# Patient Record
Sex: Male | Born: 1950
Health system: Southern US, Community
[De-identification: ages and names within clinical notes are randomized; demographics above are authoritative.]

## PROBLEM LIST (undated history)

## (undated) DIAGNOSIS — G4730 Sleep apnea, unspecified: Secondary | ICD-10-CM

## (undated) DIAGNOSIS — I10 Essential (primary) hypertension: Secondary | ICD-10-CM

## (undated) DIAGNOSIS — M1990 Unspecified osteoarthritis, unspecified site: Secondary | ICD-10-CM

## (undated) DIAGNOSIS — F419 Anxiety disorder, unspecified: Secondary | ICD-10-CM

## (undated) DIAGNOSIS — K219 Gastro-esophageal reflux disease without esophagitis: Secondary | ICD-10-CM

## (undated) DIAGNOSIS — Z87442 Personal history of urinary calculi: Secondary | ICD-10-CM

## (undated) DIAGNOSIS — E785 Hyperlipidemia, unspecified: Secondary | ICD-10-CM

## (undated) DIAGNOSIS — E119 Type 2 diabetes mellitus without complications: Secondary | ICD-10-CM

## (undated) DIAGNOSIS — N189 Chronic kidney disease, unspecified: Secondary | ICD-10-CM

## (undated) DIAGNOSIS — F32A Depression, unspecified: Secondary | ICD-10-CM

## (undated) DIAGNOSIS — F329 Major depressive disorder, single episode, unspecified: Secondary | ICD-10-CM

## (undated) DIAGNOSIS — T7840XA Allergy, unspecified, initial encounter: Secondary | ICD-10-CM

## (undated) HISTORY — PX: COLONOSCOPY: SHX174

## (undated) HISTORY — DX: Allergy, unspecified, initial encounter: T78.40XA

## (undated) HISTORY — DX: Essential (primary) hypertension: I10

## (undated) HISTORY — PX: LITHOTRIPSY: SUR834

## (undated) HISTORY — DX: Anxiety disorder, unspecified: F41.9

## (undated) HISTORY — DX: Gastro-esophageal reflux disease without esophagitis: K21.9

---

## 2003-05-27 DIAGNOSIS — G4730 Sleep apnea, unspecified: Secondary | ICD-10-CM

## 2003-05-27 HISTORY — DX: Sleep apnea, unspecified: G47.30

## 2003-08-01 ENCOUNTER — Ambulatory Visit (HOSPITAL_BASED_OUTPATIENT_CLINIC_OR_DEPARTMENT_OTHER): Admission: RE | Admit: 2003-08-01 | Discharge: 2003-08-01 | Payer: Self-pay | Admitting: Orthopedic Surgery

## 2003-08-01 ENCOUNTER — Ambulatory Visit (HOSPITAL_COMMUNITY): Admission: RE | Admit: 2003-08-01 | Discharge: 2003-08-01 | Payer: Self-pay | Admitting: Orthopedic Surgery

## 2003-09-20 ENCOUNTER — Ambulatory Visit (HOSPITAL_COMMUNITY): Admission: RE | Admit: 2003-09-20 | Discharge: 2003-09-20 | Payer: Self-pay | Admitting: Family Medicine

## 2003-09-27 ENCOUNTER — Ambulatory Visit (HOSPITAL_BASED_OUTPATIENT_CLINIC_OR_DEPARTMENT_OTHER): Admission: RE | Admit: 2003-09-27 | Discharge: 2003-09-27 | Payer: Self-pay | Admitting: Urology

## 2003-09-27 ENCOUNTER — Ambulatory Visit (HOSPITAL_COMMUNITY): Admission: RE | Admit: 2003-09-27 | Discharge: 2003-09-27 | Payer: Self-pay | Admitting: Urology

## 2004-03-22 ENCOUNTER — Ambulatory Visit (HOSPITAL_BASED_OUTPATIENT_CLINIC_OR_DEPARTMENT_OTHER): Admission: RE | Admit: 2004-03-22 | Discharge: 2004-03-22 | Payer: Self-pay | Admitting: Family Medicine

## 2004-05-24 ENCOUNTER — Encounter (INDEPENDENT_AMBULATORY_CARE_PROVIDER_SITE_OTHER): Payer: Self-pay | Admitting: Specialist

## 2004-05-24 ENCOUNTER — Ambulatory Visit (HOSPITAL_COMMUNITY): Admission: RE | Admit: 2004-05-24 | Discharge: 2004-05-24 | Payer: Self-pay | Admitting: Gastroenterology

## 2004-05-26 HISTORY — PX: OTHER SURGICAL HISTORY: SHX169

## 2006-01-27 ENCOUNTER — Ambulatory Visit: Payer: Self-pay | Admitting: Family Medicine

## 2006-05-01 ENCOUNTER — Ambulatory Visit: Payer: Self-pay | Admitting: Family Medicine

## 2006-05-01 LAB — CONVERTED CEMR LAB
Calcium: 9 mg/dL (ref 8.4–10.5)
Chloride: 108 meq/L (ref 96–112)
Glucose, Bld: 90 mg/dL (ref 70–99)
Potassium: 4.4 meq/L (ref 3.5–5.1)
Triglyceride fasting, serum: 89 mg/dL (ref 0–149)
VLDL: 18 mg/dL (ref 0–40)

## 2006-05-28 ENCOUNTER — Ambulatory Visit: Payer: Self-pay | Admitting: Internal Medicine

## 2006-06-08 ENCOUNTER — Ambulatory Visit: Payer: Self-pay | Admitting: Family Medicine

## 2006-06-11 ENCOUNTER — Encounter (INDEPENDENT_AMBULATORY_CARE_PROVIDER_SITE_OTHER): Payer: Self-pay | Admitting: Specialist

## 2006-06-11 ENCOUNTER — Ambulatory Visit: Payer: Self-pay | Admitting: Internal Medicine

## 2006-06-11 ENCOUNTER — Encounter: Payer: Self-pay | Admitting: Family Medicine

## 2006-06-11 DIAGNOSIS — K5730 Diverticulosis of large intestine without perforation or abscess without bleeding: Secondary | ICD-10-CM | POA: Insufficient documentation

## 2006-06-11 LAB — HM COLONOSCOPY

## 2006-07-19 DIAGNOSIS — G4730 Sleep apnea, unspecified: Secondary | ICD-10-CM

## 2006-07-19 DIAGNOSIS — D126 Benign neoplasm of colon, unspecified: Secondary | ICD-10-CM

## 2006-07-19 DIAGNOSIS — I10 Essential (primary) hypertension: Secondary | ICD-10-CM

## 2006-08-07 ENCOUNTER — Ambulatory Visit: Payer: Self-pay | Admitting: Family Medicine

## 2006-11-09 ENCOUNTER — Ambulatory Visit: Payer: Self-pay | Admitting: Family Medicine

## 2006-11-09 DIAGNOSIS — R0989 Other specified symptoms and signs involving the circulatory and respiratory systems: Secondary | ICD-10-CM

## 2006-11-09 DIAGNOSIS — R0609 Other forms of dyspnea: Secondary | ICD-10-CM | POA: Insufficient documentation

## 2006-11-10 ENCOUNTER — Telehealth (INDEPENDENT_AMBULATORY_CARE_PROVIDER_SITE_OTHER): Payer: Self-pay | Admitting: *Deleted

## 2006-11-13 ENCOUNTER — Ambulatory Visit: Payer: Self-pay | Admitting: Family Medicine

## 2006-11-25 ENCOUNTER — Encounter (INDEPENDENT_AMBULATORY_CARE_PROVIDER_SITE_OTHER): Payer: Self-pay | Admitting: Family Medicine

## 2006-11-25 ENCOUNTER — Telehealth (INDEPENDENT_AMBULATORY_CARE_PROVIDER_SITE_OTHER): Payer: Self-pay | Admitting: *Deleted

## 2006-11-26 ENCOUNTER — Encounter (INDEPENDENT_AMBULATORY_CARE_PROVIDER_SITE_OTHER): Payer: Self-pay | Admitting: Family Medicine

## 2006-12-01 ENCOUNTER — Telehealth (INDEPENDENT_AMBULATORY_CARE_PROVIDER_SITE_OTHER): Payer: Self-pay | Admitting: *Deleted

## 2006-12-02 ENCOUNTER — Encounter (INDEPENDENT_AMBULATORY_CARE_PROVIDER_SITE_OTHER): Payer: Self-pay | Admitting: Family Medicine

## 2006-12-03 ENCOUNTER — Encounter (INDEPENDENT_AMBULATORY_CARE_PROVIDER_SITE_OTHER): Payer: Self-pay | Admitting: *Deleted

## 2006-12-21 ENCOUNTER — Ambulatory Visit: Payer: Self-pay | Admitting: Internal Medicine

## 2007-02-02 ENCOUNTER — Ambulatory Visit: Payer: Self-pay | Admitting: Internal Medicine

## 2007-03-04 ENCOUNTER — Ambulatory Visit: Payer: Self-pay | Admitting: Family Medicine

## 2007-03-04 DIAGNOSIS — R51 Headache: Secondary | ICD-10-CM

## 2007-03-04 DIAGNOSIS — R519 Headache, unspecified: Secondary | ICD-10-CM | POA: Insufficient documentation

## 2007-03-07 LAB — CONVERTED CEMR LAB
CO2: 29 meq/L (ref 19–32)
Cholesterol: 142 mg/dL (ref 0–200)
GFR calc non Af Amer: 82 mL/min
HDL: 24 mg/dL — ABNORMAL LOW (ref >39.0)
LDL Cholesterol: 89 mg/dL (ref 0–99)
Potassium: 4.2 meq/L (ref 3.5–5.1)
Total CHOL/HDL Ratio: 5.9
Triglycerides: 146 mg/dL (ref 0–149)
VLDL: 29 mg/dL (ref 0–40)

## 2007-03-08 ENCOUNTER — Encounter (INDEPENDENT_AMBULATORY_CARE_PROVIDER_SITE_OTHER): Payer: Self-pay | Admitting: *Deleted

## 2007-03-08 ENCOUNTER — Telehealth (INDEPENDENT_AMBULATORY_CARE_PROVIDER_SITE_OTHER): Payer: Self-pay | Admitting: *Deleted

## 2007-07-12 ENCOUNTER — Telehealth (INDEPENDENT_AMBULATORY_CARE_PROVIDER_SITE_OTHER): Payer: Self-pay | Admitting: *Deleted

## 2007-07-15 ENCOUNTER — Ambulatory Visit: Payer: Self-pay | Admitting: Family Medicine

## 2007-07-15 DIAGNOSIS — M549 Dorsalgia, unspecified: Secondary | ICD-10-CM | POA: Insufficient documentation

## 2007-07-28 ENCOUNTER — Telehealth (INDEPENDENT_AMBULATORY_CARE_PROVIDER_SITE_OTHER): Payer: Self-pay | Admitting: *Deleted

## 2008-02-25 ENCOUNTER — Telehealth (INDEPENDENT_AMBULATORY_CARE_PROVIDER_SITE_OTHER): Payer: Self-pay | Admitting: *Deleted

## 2008-03-02 ENCOUNTER — Ambulatory Visit: Payer: Self-pay | Admitting: Family Medicine

## 2008-03-02 DIAGNOSIS — M6530 Trigger finger, unspecified finger: Secondary | ICD-10-CM | POA: Insufficient documentation

## 2008-03-02 DIAGNOSIS — IMO0002 Reserved for concepts with insufficient information to code with codable children: Secondary | ICD-10-CM

## 2008-03-28 ENCOUNTER — Telehealth (INDEPENDENT_AMBULATORY_CARE_PROVIDER_SITE_OTHER): Payer: Self-pay | Admitting: *Deleted

## 2008-05-01 ENCOUNTER — Telehealth (INDEPENDENT_AMBULATORY_CARE_PROVIDER_SITE_OTHER): Payer: Self-pay | Admitting: *Deleted

## 2008-05-22 ENCOUNTER — Encounter (INDEPENDENT_AMBULATORY_CARE_PROVIDER_SITE_OTHER): Payer: Self-pay | Admitting: *Deleted

## 2008-05-22 ENCOUNTER — Ambulatory Visit: Payer: Self-pay | Admitting: Family Medicine

## 2008-05-22 DIAGNOSIS — D485 Neoplasm of uncertain behavior of skin: Secondary | ICD-10-CM

## 2008-05-25 ENCOUNTER — Ambulatory Visit: Payer: Self-pay | Admitting: Family Medicine

## 2008-05-30 ENCOUNTER — Telehealth (INDEPENDENT_AMBULATORY_CARE_PROVIDER_SITE_OTHER): Payer: Self-pay | Admitting: *Deleted

## 2008-05-30 LAB — CONVERTED CEMR LAB
AST: 18 units/L (ref 0–37)
Alkaline Phosphatase: 86 units/L (ref 39–117)
BUN: 11 mg/dL (ref 6–23)
Calcium: 9.2 mg/dL (ref 8.4–10.5)
Chloride: 107 meq/L (ref 96–112)
Cholesterol: 140 mg/dL (ref 0–200)
Eosinophils Relative: 1.2 % (ref 0.0–5.0)
GFR calc Af Amer: 99 mL/min
GFR calc non Af Amer: 82 mL/min
Glucose, Bld: 110 mg/dL — ABNORMAL HIGH (ref 70–99)
LDL Cholesterol: 94 mg/dL (ref 0–99)
Lymphocytes Relative: 17.5 % (ref 12.0–46.0)
Monocytes Absolute: 0.5 10*3/uL (ref 0.1–1.0)
Neutro Abs: 3.9 10*3/uL (ref 1.4–7.7)
Neutrophils Relative %: 72.7 % (ref 43.0–77.0)
PSA: 0.35 ng/mL (ref 0.10–4.00)
Sodium: 142 meq/L (ref 135–145)
Total Bilirubin: 0.7 mg/dL (ref 0.3–1.2)
Total CHOL/HDL Ratio: 6.3
Triglycerides: 118 mg/dL (ref 0–149)
WBC: 5.5 10*3/uL (ref 4.5–10.5)

## 2008-06-02 ENCOUNTER — Telehealth (INDEPENDENT_AMBULATORY_CARE_PROVIDER_SITE_OTHER): Payer: Self-pay | Admitting: *Deleted

## 2008-06-02 ENCOUNTER — Ambulatory Visit: Payer: Self-pay | Admitting: Family Medicine

## 2008-06-02 DIAGNOSIS — F411 Generalized anxiety disorder: Secondary | ICD-10-CM | POA: Insufficient documentation

## 2008-06-13 ENCOUNTER — Encounter: Payer: Self-pay | Admitting: Family Medicine

## 2008-07-05 ENCOUNTER — Ambulatory Visit: Payer: Self-pay | Admitting: Family Medicine

## 2008-07-05 DIAGNOSIS — J309 Allergic rhinitis, unspecified: Secondary | ICD-10-CM | POA: Insufficient documentation

## 2008-07-05 DIAGNOSIS — J019 Acute sinusitis, unspecified: Secondary | ICD-10-CM

## 2008-07-25 ENCOUNTER — Telehealth (INDEPENDENT_AMBULATORY_CARE_PROVIDER_SITE_OTHER): Payer: Self-pay | Admitting: *Deleted

## 2008-09-25 ENCOUNTER — Telehealth (INDEPENDENT_AMBULATORY_CARE_PROVIDER_SITE_OTHER): Payer: Self-pay | Admitting: *Deleted

## 2008-09-27 ENCOUNTER — Telehealth (INDEPENDENT_AMBULATORY_CARE_PROVIDER_SITE_OTHER): Payer: Self-pay | Admitting: *Deleted

## 2008-10-30 ENCOUNTER — Telehealth (INDEPENDENT_AMBULATORY_CARE_PROVIDER_SITE_OTHER): Payer: Self-pay | Admitting: *Deleted

## 2008-11-24 ENCOUNTER — Ambulatory Visit: Payer: Self-pay | Admitting: Family Medicine

## 2008-11-24 DIAGNOSIS — E785 Hyperlipidemia, unspecified: Secondary | ICD-10-CM

## 2008-11-30 ENCOUNTER — Ambulatory Visit: Payer: Self-pay | Admitting: Family Medicine

## 2008-12-01 ENCOUNTER — Telehealth (INDEPENDENT_AMBULATORY_CARE_PROVIDER_SITE_OTHER): Payer: Self-pay | Admitting: *Deleted

## 2008-12-01 LAB — CONVERTED CEMR LAB
Alkaline Phosphatase: 85 units/L (ref 39–117)
HDL: 26.6 mg/dL — ABNORMAL LOW (ref >39.00)
Total Bilirubin: 1 mg/dL (ref 0.3–1.2)
Triglycerides: 146 mg/dL (ref 0.0–149.0)
VLDL: 29.2 mg/dL (ref 0.0–40.0)

## 2009-01-01 ENCOUNTER — Telehealth (INDEPENDENT_AMBULATORY_CARE_PROVIDER_SITE_OTHER): Payer: Self-pay | Admitting: *Deleted

## 2009-01-05 ENCOUNTER — Ambulatory Visit: Payer: Self-pay | Admitting: Family Medicine

## 2009-01-08 ENCOUNTER — Telehealth (INDEPENDENT_AMBULATORY_CARE_PROVIDER_SITE_OTHER): Payer: Self-pay | Admitting: *Deleted

## 2009-01-09 ENCOUNTER — Encounter (INDEPENDENT_AMBULATORY_CARE_PROVIDER_SITE_OTHER): Payer: Self-pay | Admitting: *Deleted

## 2009-01-09 LAB — CONVERTED CEMR LAB
ALT: 20 units/L (ref 0–53)
Albumin: 3.8 g/dL (ref 3.5–5.2)
Alkaline Phosphatase: 81 units/L (ref 39–117)
Total Protein: 6.8 g/dL (ref 6.0–8.3)

## 2009-01-18 ENCOUNTER — Telehealth (INDEPENDENT_AMBULATORY_CARE_PROVIDER_SITE_OTHER): Payer: Self-pay | Admitting: *Deleted

## 2009-02-12 ENCOUNTER — Telehealth (INDEPENDENT_AMBULATORY_CARE_PROVIDER_SITE_OTHER): Payer: Self-pay | Admitting: *Deleted

## 2009-03-09 ENCOUNTER — Telehealth (INDEPENDENT_AMBULATORY_CARE_PROVIDER_SITE_OTHER): Payer: Self-pay | Admitting: *Deleted

## 2009-03-30 ENCOUNTER — Ambulatory Visit: Payer: Self-pay | Admitting: Family Medicine

## 2009-03-30 DIAGNOSIS — S139XXA Sprain of joints and ligaments of unspecified parts of neck, initial encounter: Secondary | ICD-10-CM

## 2009-04-10 ENCOUNTER — Telehealth (INDEPENDENT_AMBULATORY_CARE_PROVIDER_SITE_OTHER): Payer: Self-pay | Admitting: *Deleted

## 2009-05-09 ENCOUNTER — Telehealth (INDEPENDENT_AMBULATORY_CARE_PROVIDER_SITE_OTHER): Payer: Self-pay | Admitting: *Deleted

## 2009-06-11 ENCOUNTER — Telehealth (INDEPENDENT_AMBULATORY_CARE_PROVIDER_SITE_OTHER): Payer: Self-pay | Admitting: *Deleted

## 2009-06-11 ENCOUNTER — Encounter: Payer: Self-pay | Admitting: Internal Medicine

## 2009-06-12 ENCOUNTER — Telehealth (INDEPENDENT_AMBULATORY_CARE_PROVIDER_SITE_OTHER): Payer: Self-pay | Admitting: *Deleted

## 2009-07-02 ENCOUNTER — Ambulatory Visit: Payer: Self-pay | Admitting: Family

## 2009-07-02 LAB — CONVERTED CEMR LAB
AST: 23 units/L (ref 0–37)
Albumin: 3.8 g/dL (ref 3.5–5.2)
Alkaline Phosphatase: 80 units/L (ref 39–117)
BUN: 10 mg/dL (ref 6–23)
Basophils Absolute: 0 10*3/uL (ref 0.0–0.1)
Basophils Relative: 0.4 % (ref 0.0–3.0)
CO2: 30 meq/L (ref 19–32)
Calcium: 8.8 mg/dL (ref 8.4–10.5)
Chloride: 110 meq/L (ref 96–112)
Cholesterol: 107 mg/dL (ref 0–200)
Creatinine, Ser: 1.1 mg/dL (ref 0.4–1.5)
Eosinophils Absolute: 0.1 10*3/uL (ref 0.0–0.7)
Eosinophils Relative: 2.8 % (ref 0.0–5.0)
GFR calc non Af Amer: 72.56 mL/min (ref >60)
Glucose, Bld: 92 mg/dL (ref 70–99)
HCT: 44.2 % (ref 39.0–52.0)
Hemoglobin: 14.6 g/dL (ref 13.0–17.0)
LDL Cholesterol: 49 mg/dL (ref 0–99)
Lymphs Abs: 1.4 10*3/uL (ref 0.7–4.0)
MCV: 94.4 fL (ref 78.0–100.0)
Neutro Abs: 2.5 10*3/uL (ref 1.4–7.7)
PSA: 0.32 ng/mL (ref 0.10–4.00)
RBC: 4.69 M/uL (ref 4.22–5.81)
Total CHOL/HDL Ratio: 4
Triglycerides: 138 mg/dL (ref 0.0–149.0)
WBC: 4.4 10*3/uL — ABNORMAL LOW (ref 4.5–10.5)

## 2009-07-03 ENCOUNTER — Encounter: Payer: Self-pay | Admitting: Family

## 2009-08-21 ENCOUNTER — Telehealth (INDEPENDENT_AMBULATORY_CARE_PROVIDER_SITE_OTHER): Payer: Self-pay | Admitting: *Deleted

## 2009-09-11 ENCOUNTER — Telehealth (INDEPENDENT_AMBULATORY_CARE_PROVIDER_SITE_OTHER): Payer: Self-pay | Admitting: *Deleted

## 2009-09-25 ENCOUNTER — Ambulatory Visit: Payer: Self-pay | Admitting: Family Medicine

## 2009-09-25 DIAGNOSIS — IMO0002 Reserved for concepts with insufficient information to code with codable children: Secondary | ICD-10-CM | POA: Insufficient documentation

## 2009-10-11 ENCOUNTER — Ambulatory Visit: Payer: Self-pay | Admitting: Family Medicine

## 2009-10-30 ENCOUNTER — Telehealth: Payer: Self-pay | Admitting: Family Medicine

## 2009-11-28 ENCOUNTER — Telehealth (INDEPENDENT_AMBULATORY_CARE_PROVIDER_SITE_OTHER): Payer: Self-pay | Admitting: *Deleted

## 2010-01-10 ENCOUNTER — Telehealth (INDEPENDENT_AMBULATORY_CARE_PROVIDER_SITE_OTHER): Payer: Self-pay | Admitting: *Deleted

## 2010-01-14 ENCOUNTER — Telehealth (INDEPENDENT_AMBULATORY_CARE_PROVIDER_SITE_OTHER): Payer: Self-pay | Admitting: *Deleted

## 2010-01-29 ENCOUNTER — Telehealth: Payer: Self-pay | Admitting: Family Medicine

## 2010-02-06 ENCOUNTER — Telehealth (INDEPENDENT_AMBULATORY_CARE_PROVIDER_SITE_OTHER): Payer: Self-pay | Admitting: *Deleted

## 2010-02-11 ENCOUNTER — Telehealth (INDEPENDENT_AMBULATORY_CARE_PROVIDER_SITE_OTHER): Payer: Self-pay | Admitting: *Deleted

## 2010-02-25 ENCOUNTER — Telehealth: Payer: Self-pay | Admitting: Family Medicine

## 2010-04-15 ENCOUNTER — Telehealth (INDEPENDENT_AMBULATORY_CARE_PROVIDER_SITE_OTHER): Payer: Self-pay | Admitting: *Deleted

## 2010-05-01 ENCOUNTER — Telehealth: Payer: Self-pay | Admitting: Family Medicine

## 2010-05-13 ENCOUNTER — Telehealth (INDEPENDENT_AMBULATORY_CARE_PROVIDER_SITE_OTHER): Payer: Self-pay | Admitting: *Deleted

## 2010-05-28 ENCOUNTER — Telehealth: Payer: Self-pay | Admitting: Family Medicine

## 2010-06-10 ENCOUNTER — Ambulatory Visit
Admission: RE | Admit: 2010-06-10 | Discharge: 2010-06-10 | Payer: Self-pay | Source: Home / Self Care | Attending: Family Medicine | Admitting: Family Medicine

## 2010-06-11 ENCOUNTER — Telehealth (INDEPENDENT_AMBULATORY_CARE_PROVIDER_SITE_OTHER): Payer: Self-pay | Admitting: *Deleted

## 2010-06-18 ENCOUNTER — Telehealth (INDEPENDENT_AMBULATORY_CARE_PROVIDER_SITE_OTHER): Payer: Self-pay | Admitting: *Deleted

## 2010-06-23 LAB — CONVERTED CEMR LAB
Calcium: 9 mg/dL (ref 8.4–10.5)
Cholesterol: 146 mg/dL (ref 0–200)
Creatinine, Ser: 0.9 mg/dL (ref 0.4–1.5)
Glucose, Bld: 99 mg/dL (ref 70–99)
HDL: 22.9 mg/dL — ABNORMAL LOW (ref >39.0)
LDL Cholesterol: 100 mg/dL — ABNORMAL HIGH (ref 0–99)
Total CHOL/HDL Ratio: 6.4
Triglycerides: 115 mg/dL (ref 0–149)
VLDL: 23 mg/dL (ref 0–40)

## 2010-06-25 NOTE — Progress Notes (Signed)
Summary: Refill Request  Phone Note Refill Request Call back at [phone] Message from:  Patient's Wife on October 30, 2009 8:24 AM  Refills Requested: Medication #1:  ALPRAZOLAM 0.5 MG  TABS 1 tab as needed for anxiety   Dosage confirmed as above?Dosage Confirmed   Supply Requested: 1 month   Last Refilled: 08/21/2009   Notes: x1 [facility]  Next Appointment Scheduled: none Initial call taken by: Harold Barban,  October 30, 2009 8:24 AM  Follow-up for Phone Call        last ov- 10/11/09. Army Fossa CMA  October 30, 2009 8:31 AM   Additional Follow-up for Phone Call Additional follow up Details #1::        ok to refill x1 Additional Follow-up by: Loreen Freud DO,  October 30, 2009 9:26 AM    Prescriptions: ALPRAZOLAM 0.5 MG  TABS (ALPRAZOLAM) 1 tab as needed for anxiety  #30 x 0   Entered by:   Army Fossa CMA   Authorized by:   Loreen Freud DO   Signed by:   Army Fossa CMA on 10/30/2009   Method used:   Printed then faxed to ...       [facility] (retail)       [address]       [place], [place]  [address]       Ph: [phone]       Fax: [phone]   RxID:   [id number]

## 2010-06-25 NOTE — Progress Notes (Signed)
Summary: alprazolam refill   Phone Note Refill Request Message from:  Fax from Pharmacy  Refills Requested: Medication #1:  ALPRAZOLAM 0.5 MG  TABS 1 tab as needed for anxiety   Last Refilled: 05/21/2009 [facility]---[phone]      fax---[phone]  Initial call taken by: Warnell Forester,  June 11, 2009 10:09 AM    Prescriptions: ALPRAZOLAM 0.5 MG  TABS (ALPRAZOLAM) 1 tab as needed for anxiety  #30 x 1   Entered by:   Doristine Devoid   Authorized by:   Neena Rhymes MD   Signed by:   Doristine Devoid on 06/12/2009   Method used:   Telephoned to ...       [facility] (retail)       [address]       [place], [place]  [address]       Ph: [phone]       Fax: [phone]   RxID:   [id number]

## 2010-06-25 NOTE — Progress Notes (Signed)
Summary: refill  Phone Note Refill Request Message from:  Fax from Pharmacy on February 06, 2010 11:47 AM  Refills Requested: Medication #1:  SIMVASTATIN 10 MG TABS take one tablet at bedtime [BMN] [facility] - fax [phone]  Initial call taken by: Okey Regal Spring,  February 06, 2010 11:48 AM    Prescriptions: SIMVASTATIN 10 MG TABS (SIMVASTATIN) take one tablet at bedtime Brand medically necessary #30 x 1   Entered by:   Almeta Monas CMA (AAMA)   Authorized by:   Loreen Freud DO   Signed by:   Almeta Monas CMA (AAMA) on 02/06/2010   Method used:   Electronically to        [facility] (retail)       [address]       [place], [place]  [address]       Ph: [phone]       Fax: [phone]   RxID:   [id number]

## 2010-06-25 NOTE — Progress Notes (Signed)
Summary: ALPRAZOLAM REFILL  Phone Note Refill Request Message from:  Fax from Pharmacy on February 25, 2010 11:32 AM  Refills Requested: Medication #1:  ALPRAZOLAM 0.5 MG  TABS 1 tab as needed for anxiety Deirdre Evener    [facility] [phone]  Initial call taken by: Jerolyn Shin,  February 25, 2010 11:33 AM  Follow-up for Phone Call        Last OV 10/11/09 and filled 01/29/10. Please advise Follow-up by: Almeta Monas CMA Duncan Dull),  February 25, 2010 12:00 PM  Additional Follow-up for Phone Call Additional follow up Details #1::        refill x1  1 refills Additional Follow-up by: Loreen Freud DO,  February 25, 2010 12:03 PM    Additional Follow-up for Phone Call Additional follow up Details #2::    Faxed to pharm.    Almeta Monas CMA Duncan Dull)  February 25, 2010 1:09 PM   Prescriptions: ALPRAZOLAM 0.5 MG  TABS (ALPRAZOLAM) 1 tab as needed for anxiety  #30 x 1   Entered and Authorized by:   Loreen Freud DO   Signed by:   Loreen Freud DO on 02/25/2010   Method used:   Printed then faxed to ...       [facility] (retail)       [address]       [place], [place]  [address]       Ph: [phone]       Fax: [phone]   RxID:   [id number]

## 2010-06-25 NOTE — Letter (Signed)
   [facility] [address] [place], [place] [address] [phone]    July 03, 2009   JERAULD BOSTWICK [address] [place], [place] [address]  RE:  LAB RESULTS  Dear  Mr. Kleckley,  The following is an interpretation of your most recent lab tests.  Please take note of any instructions provided or changes to medications that have resulted from your lab work.  PSA:  normal - no follow-up needed PSA: 0.32  ELECTROLYTES:  Good - no changes needed  KIDNEY FUNCTION TESTS:  Good - no changes needed  LIVER FUNCTION TESTS:  Good - no changes needed  LIPID PANEL:  Stable - no changes needed Triglyceride: 138.0   Cholesterol: 107   LDL: 49   HDL: 30.50   Chol/HDL%:  4   DIABETIC STUDIES:  Good - no changes needed Blood Glucose: 92     CBC:  Stable - no changes needed   Sincerely Yours,    Lemont Fillers FNP

## 2010-06-25 NOTE — Consult Note (Signed)
Summary: [facility]-- epistaxis  [facility]   Imported By: Lanelle Bal 06/15/2009 09:53:22  _____________________________________________________________________  External Attachment:    Type:   Image     Comment:   External Document

## 2010-06-25 NOTE — Progress Notes (Signed)
Summary: alprazolam refill  Phone Note Refill Request Message from:  Fax from Pharmacy on August 21, 2009 12:09 PM  Refills Requested: Medication #1:  ALPRAZOLAM 0.5 MG  TABS 1 tab as needed for anxiety [facility] fax [phone]   Method Requested: Fax to Local Pharmacy Next Appointment Scheduled: no appt  Initial call taken by: Barb Merino,  August 21, 2009 12:10 PM    Prescriptions: ALPRAZOLAM 0.5 MG  TABS (ALPRAZOLAM) 1 tab as needed for anxiety  #30 x 1   Entered by:   Doristine Devoid   Authorized by:   Neena Rhymes MD   Signed by:   Doristine Devoid on 08/21/2009   Method used:   Printed then faxed to ...       [facility] (retail)       [address]       [place], [place]  [address]       Ph: [phone]       Fax: [phone]   RxID:   [id number]

## 2010-06-25 NOTE — Assessment & Plan Note (Signed)
Summary: back & neck pain/cbs   Vital Signs:  Patient profile:   60 year old male Weight:      241.50 pounds Pulse rate:   82 / minute Pulse rhythm:   regular BP sitting:   122 / 86  (left arm) Cuff size:   large  Vitals Entered By: Army Fossa CMA (Oct 11, 2009 4:19 PM) CC: Pt here c/o not able to get rid of cough, had neck pain but none this week. , URI symptoms   History of Present Illness:       This is a 60 year old man who presents with URI symptoms.  The patient complains of nasal congestion, purulent nasal discharge, and productive cough, but denies clear nasal discharge, sore throat, dry cough, earache, and sick contacts.  Associated symptoms include fever.  The patient denies low-grade fever (<100.5 degrees), fever of 100.5-103 degrees, fever of 103.1-104 degrees, fever to >104 degrees, stiff neck, dyspnea, wheezing, rash, vomiting, diarrhea, use of an antipyretic, and response to antipyretic.  The patient also reports headache.  The patient denies itchy watery eyes, itchy throat, sneezing, seasonal symptoms, response to antihistamine, muscle aches, and severe fatigue.  The patient denies the following risk factors for Strep sinusitis: unilateral facial pain, unilateral nasal discharge, poor response to decongestant, double sickening, tooth pain, Strep exposure, tender adenopathy, and absence of cough.    Current Medications (verified): 1)  Baby Aspirin 81 Mg  Chew (Aspirin) 2)  Micardis 40 Mg  Tabs (Telmisartan) .Marland Kitchen.. 1 By Mouth Qd 3)  Citalopram Hydrobromide 20 Mg Tabs (Citalopram Hydrobromide) .Marland Kitchen.. 1 Tab By Mouth Daily 4)  Alprazolam 0.5 Mg  Tabs (Alprazolam) .Marland Kitchen.. 1 Tab As Needed For Anxiety 5)  Simvastatin 10 Mg Tabs (Simvastatin) .... Take One Tablet At Bedtime 6)  Augmentin 875-125 Mg Tabs (Amoxicillin-Pot Clavulanate) .Marland Kitchen.. 1 By Mouth Two Times A Day 7)  Cheratussin Ac 100-10 Mg/96ml Syrp (Guaifenesin-Codeine) .Marland Kitchen.. 1-2 By Mouth At Bedtime As Needed  Allergies  (verified): No Known Drug Allergies  Past History:  Past medical, surgical, family and social histories (including risk factors) reviewed for relevance to current acute and chronic problems.  Past Medical History: Reviewed history from 11/24/2008 and no changes required. Hypertension Headache Back pain anxiety  Past Surgical History: Reviewed history from 05/22/2008 and no changes required. none  Family History: Reviewed history from 03/04/2007 and no changes required. pancreatic cancer Family History Diabetes 1st degree relative Family History Hypertension  Social History: Reviewed history from 03/04/2007 and no changes required. Occupation: [employer] Married Never Smoked Alcohol use-no Drug use-no Regular exercise-no  Review of Systems      See HPI  Physical Exam  General:  Well-developed,well-nourished,in no acute distress; alert,appropriate and cooperative throughout examination Ears:  External ear exam shows no significant lesions or deformities.  Otoscopic examination reveals clear canals, tympanic membranes are intact bilaterally without bulging, retraction, inflammation or discharge. Hearing is grossly normal bilaterally. Nose:  L frontal sinus tenderness, L maxillary sinus tenderness, R frontal sinus tenderness, and R maxillary sinus tenderness.   Mouth:  Oral mucosa and oropharynx without lesions or exudates.  Teeth in good repair. Neck:  No deformities, masses, or tenderness noted. Lungs:  Normal respiratory effort, chest expands symmetrically. Lungs are clear to auscultation, no crackles or wheezes. Heart:  Normal rate and regular rhythm. S1 and S2 normal without gallop, murmur, click, rub or other extra sounds. Cervical Nodes:  No lymphadenopathy noted Psych:  Cognition and judgment appear intact. Alert and  cooperative with normal attention span and concentration. No apparent delusions, illusions, hallucinations   Impression &  Recommendations:  Problem # 1:  SINUSITIS- ACUTE-NOS (ICD-461.9)  His updated medication list for this problem includes:    Augmentin 875-125 Mg Tabs (Amoxicillin-pot clavulanate) .Marland Kitchen... 1 by mouth two times a day    Cheratussin Ac 100-10 Mg/14ml Syrp (Guaifenesin-codeine) .Marland Kitchen... 1-2 by mouth at bedtime as needed    Nasonex 50 Mcg/act Susp (Mometasone furoate) .Marland Kitchen... 2 sprays each nostril once daily  Instructed on treatment. Call if symptoms persist or worsen.   Complete Medication List: 1)  Baby Aspirin 81 Mg Chew (Aspirin) 2)  Micardis 40 Mg Tabs (Telmisartan) .Marland Kitchen.. 1 by mouth qd 3)  Citalopram Hydrobromide 20 Mg Tabs (Citalopram hydrobromide) .Marland Kitchen.. 1 tab by mouth daily 4)  Alprazolam 0.5 Mg Tabs (Alprazolam) .Marland Kitchen.. 1 tab as needed for anxiety 5)  Simvastatin 10 Mg Tabs (Simvastatin) .... Take one tablet at bedtime 6)  Augmentin 875-125 Mg Tabs (Amoxicillin-pot clavulanate) .Marland Kitchen.. 1 by mouth two times a day 7)  Cheratussin Ac 100-10 Mg/35ml Syrp (Guaifenesin-codeine) .Marland Kitchen.. 1-2 by mouth at bedtime as needed 8)  Nasonex 50 Mcg/act Susp (Mometasone furoate) .... 2 sprays each nostril once daily  Patient Instructions: 1)  nasonex 2 sprays each nostril once daily  2)  take abx as rx on bottle 3)  use cheratussin at night for cough and get mucinex for daytime Prescriptions: CHERATUSSIN AC 100-10 MG/5ML SYRP (GUAIFENESIN-CODEINE) 1-2 by mouth at bedtime as needed  #6 oz x 0   Entered and Authorized by:   Loreen Freud DO   Signed by:   Loreen Freud DO on 10/11/2009   Method used:   Print then Give to Patient   RxID:   [id number] AUGMENTIN 875-125 MG TABS (AMOXICILLIN-POT CLAVULANATE) 1 by mouth two times a day  #20 x 0   Entered and Authorized by:   Loreen Freud DO   Signed by:   Loreen Freud DO on 10/11/2009   Method used:   Electronically to        [facility] [address] [facility] [id number]* (retail)       [address] [place], [place]  [address]       Ph: [phone]       Fax: [phone]    RxID:   [id number]

## 2010-06-25 NOTE — Progress Notes (Signed)
Summary: refill simvastatin  Phone Note Refill Request Message from:  Fax from Pharmacy on April 15, 2010 3:22 PM  Refills Requested: Medication #1:  SIMVASTATIN 10 MG TABS take one tablet at bedtime [BMN] [facility] - fax [phone] - tel [phone]  Initial call taken by: Okey Regal Spring,  April 15, 2010 3:23 PM    Prescriptions: SIMVASTATIN 10 MG TABS (SIMVASTATIN) take one tablet at bedtime Brand medically necessary #30 x 1   Entered by:   Doristine Devoid CMA   Authorized by:   Neena Rhymes MD   Signed by:   Doristine Devoid CMA on 04/15/2010   Method used:   Electronically to        [facility] (retail)       [address]       [place], [place]  [address]       Ph: [phone]       Fax: [phone]   RxID:   [id number]

## 2010-06-25 NOTE — Assessment & Plan Note (Signed)
Summary: back pain--acute only--tl  Medications Added FLEXERIL 10 MG TABS (CYCLOBENZAPRINE HCL) 1 by mouth three times a day PRN      Allergies Added: NKDA  Vital Signs:  Patient Profile:   60 Years Old Male Height:     68 inches Weight:      347.6 pounds Temp:     97.7 degrees F Pulse rate:   80 / minute Resp:     18 per minute BP sitting:   118 / 70  (right arm)  Pt. in pain?   no  Vitals Entered By: Ardyth Man (July 15, 2007 12:29 PM)                  Chief Complaint:  back pain 3/10 gradually increase7/10.  History of Present Illness: Patient reports he was trying to pick his 29 y/o son from the floor 1 weeks. Noted discomfort in right lower back with spasm. Certain activity precipitate the discomfort. If he sits for too long or wakes up in the morning.No bowel or bladder dysfunction.  No radiation.    Current Allergies (reviewed today): No known allergies   Past Medical History:    Reviewed history from 03/04/2007 and no changes required:       Hypertension       Headache      Physical Exam  General:     Well-developed,well-nourished,in no acute distress; alert,appropriate and cooperative throughout examination Msk:     No midline tenderness. Tenderness right paraspinal lumbar area. Some spasm  noted. Negative straight leg raise. Able to walk on his toes and heels Neurologic:     alert & oriented X3 and DTRs symmetrical and normal.      Impression & Recommendations:  Problem # 1:  BACK PAIN (ICD-724.5) Strain. His updated medication list for this problem includes:    Baby Aspirin 81 Mg Chew (Aspirin)    Hydrocodone-acetaminophen 5-500 Mg Tabs (Hydrocodone-acetaminophen) .Marland Kitchen... 1 by mouth q6hr prn    Flexeril 10 Mg Tabs (Cyclobenzaprine hcl) .Marland Kitchen... 1 by mouth three times a day prn Discussed use of medication. Side effects reviewed. Provided `1 patch of Lidoderm to use if needed. Instructions reviewed.  To be seen in 2 weeks if no  improvement; sooner if worsening of symptoms.   Complete Medication List: 1)  Baby Aspirin 81 Mg Chew (Aspirin) 2)  Flonase 50 Mcg/act Susp (Fluticasone propionate) .... Use 2 sprays in each nostril daily 3)  Micardis 40 Mg Tabs (Telmisartan) .Marland Kitchen.. 1 by mouth qd 4)  Hydrocodone-acetaminophen 5-500 Mg Tabs (Hydrocodone-acetaminophen) .Marland Kitchen.. 1 by mouth q6hr prn 5)  Flexeril 10 Mg Tabs (Cyclobenzaprine hcl) .Marland Kitchen.. 1 by mouth three times a day prn     Prescriptions: FLEXERIL 10 MG TABS (CYCLOBENZAPRINE HCL) 1 by mouth three times a day PRN  #30 x 0   Entered and Authorized by:   Leanne Chang MD   Signed by:   Leanne Chang MD on 07/15/2007   Method used:   Print then Give to Patient   RxID:   [id number]  ]

## 2010-06-25 NOTE — Progress Notes (Signed)
Summary: alprazolam refill   Phone Note Refill Request Call back at [phone] Message from:  Pharmacy on November 28, 2009 11:40 AM  Refills Requested: Medication #1:  ALPRAZOLAM 0.5 MG  TABS 1 tab as needed for anxiety   Dosage confirmed as above?Dosage Confirmed   Supply Requested: 1 month   Last Refilled: 10/30/2009 [facility]  Next Appointment Scheduled: NONE Initial call taken by: Lavell Islam,  November 28, 2009 11:41 AM    Prescriptions: ALPRAZOLAM 0.5 MG  TABS (ALPRAZOLAM) 1 tab as needed for anxiety  #30 x 0   Entered by:   Doristine Devoid   Authorized by:   Neena Rhymes MD   Signed by:   Doristine Devoid on 11/28/2009   Method used:   Printed then faxed to ...       [facility] (retail)       [address]       [place], [place]  [address]       Ph: [phone]       Fax: [phone]   RxID:   [id number]
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
## 2010-06-25 NOTE — Progress Notes (Signed)
Summary: refill  Phone Note Refill Request Message from:  Patient on January 14, 2010 2:20 PM  Refills Requested: Medication #1:  SIMVASTATIN 10 MG TABS take one tablet at bedtime [BMN] [facility] - [phone] - patient almost out   Initial call taken by: Okey Regal Spring,  January 14, 2010 2:21 PM    Prescriptions: SIMVASTATIN 10 MG TABS (SIMVASTATIN) take one tablet at bedtime Brand medically necessary #30 x 0   Entered by:   Jeremy Johann CMA   Authorized by:   Loreen Freud DO   Signed by:   Jeremy Johann CMA on 01/14/2010   Method used:   Faxed to ...       [facility] (retail)       [address]       [place], [place]  [address]       Ph: [phone]       Fax: [phone]   RxID:   [id number]

## 2010-06-25 NOTE — Progress Notes (Signed)
Summary: due f/u  Phone Note Outgoing Call   Summary of Call: pls have patient schedule cpx due for diabetes check and labs  w/ [facility]  Initial call taken by: Doristine Devoid,  June 12, 2009 2:32 PM    Additional Follow-up for Phone Call Additional follow up Details #2::    patient has an appt on feb 7,2011.Marland KitchenMarland KitchenMarland KitchenBarb Merino  June 12, 2009 2:39 PM  Follow-up by: Barb Merino,  June 12, 2009 2:39 PM

## 2010-06-25 NOTE — Progress Notes (Signed)
Summary: citalopram refill   Phone Note Refill Request Message from:  Fax from Pharmacy on January 10, 2010 8:33 AM  Refills Requested: Medication #1:  CITALOPRAM HYDROBROMIDE 20 MG TABS 1 tab by mouth daily [facility]- fax [phone]  Initial call taken by: Okey Regal Spring,  January 10, 2010 8:34 AM    Prescriptions: CITALOPRAM HYDROBROMIDE 20 MG TABS (CITALOPRAM HYDROBROMIDE) 1 tab by mouth daily  #30 x 3   Entered by:   Doristine Devoid CMA   Authorized by:   Neena Rhymes MD   Signed by:   Doristine Devoid CMA on 01/10/2010   Method used:   Electronically to        [facility] (retail)       [address]       [place], [place]  [address]       Ph: [phone]       Fax: [phone]   RxID:   [id number]

## 2010-06-25 NOTE — Assessment & Plan Note (Signed)
Summary: cpx/ns/kdc   Vital Signs:  Patient profile:   60 year old male Height:      68 inches Weight:      245.6 pounds BMI:     37.48 Pulse rate:   74 / minute BP sitting:   112 / 60  (left arm)  Vitals Entered By: Doristine Devoid (July 02, 2009 10:25 AM) CC: CPX AND LABS    Primary Care Provider:  Beverely Low  CC:  CPX AND LABS .  History of Present Illness: Taylor Harvey is a 60 year old male who presents today for a complete physical.  He denies any specific complaints.    Hyperlipidemia- taking simvastatin,  denies associated muscle pain.  Not adhering currently to low cholesterol diet. Recently lost his son which has impacted his compliance with Diet and exercise.  Trying to get back to walking.    OSA-  was not using CPAP.    HTN- patient does not add salt to anything.    Allergies: No Known Drug Allergies  Past History:  Past Medical History: Last updated: 11/24/2008 Hypertension Headache Back pain anxiety  Past Surgical History: Last updated: 05/22/2008 none  Family History: Last updated: 03/04/2007 pancreatic cancer Family History Diabetes 1st degree relative Family History Hypertension  Social History: Last updated: 03/04/2007 Occupation: [employer] Married Never Smoked Alcohol use-no Drug use-no Regular exercise-no  Risk Factors: Exercise: no (03/04/2007)  Risk Factors: Smoking Status: never (03/04/2007)  Family History: Reviewed history from 03/04/2007 and no changes required. pancreatic cancer Family History Diabetes 1st degree relative Family History Hypertension  Social History: Reviewed history from 03/04/2007 and no changes required. Occupation: [employer] Married Never Smoked Alcohol use-no Drug use-no Regular exercise-no  Review of Systems       Constitutional: Denies Fever ENT:  Denies nasal congestion or sore throat. Resp: Denies cough CV:  Denies Chest Pain GI:  Denies  nausea or vomitting (notes some nausea over the weekend) GU: Denies dysuria Lymphatic: Denies lymphadenopathy Musculoskeletal:  Denies joint pain,  notes some discomfort in left bicep Skin:  Denies Rashes Psychiatric: Notes that depression is controlled on citalopram Neuro: Denies numbness     Physical Exam  General:  overweight white male, NAD Head:  Normocephalic and atraumatic without obvious abnormalities. No apparent alopecia or balding. Eyes:  PERRLA Mouth:  Oral mucosa and oropharynx without lesions or exudates.  Teeth in good repair. Neck:  No deformities, masses, or tenderness noted. Lungs:  Normal respiratory effort, chest expands symmetrically. Lungs are clear to auscultation, no crackles or wheezes. Heart:  Normal rate and regular rhythm. S1 and S2 normal without gallop, murmur, click, rub or other extra sounds. Rectal:  No external abnormalities noted. Normal sphincter tone. No rectal masses or tenderness. Prostate:  Prostate gland firm and smooth, no enlargement, nodularity, tenderness, mass, asymmetry or induration. Msk:  No deformity or scoliosis noted of thoracic or lumbar spine.   Extremities:  No clubbing, cyanosis, edema, or deformity noted with normal full range of motion of all joints.   Neurologic:  alert & oriented X3 and gait normal.   Skin:  Intact without suspicious lesions or rashes Cervical Nodes:  No lymphadenopathy noted Psych:  Cognition and judgment appear intact. Alert and cooperative with normal attention span and concentration. No apparent delusions, illusions, hallucinations   Impression & Recommendations:  Problem # 1:  PREVENTIVE HEALTH CARE (ICD-V70.0) Assessment Comment Only Immunizations reviewed, patient  counseled on diet and exercise.  Check baseline labs Orders:  Venipuncture ([address]) TLB-Lipid Panel (80061-LIPID) TLB-BMP (Basic Metabolic Panel-BMET) (80048-METABOL) TLB-Hepatic/Liver Function Pnl (80076-HEPATIC) TLB-CBC Platelet -  w/Differential (85025-CBCD) TLB-PSA (Prostate Specific Antigen) (84153-PSA)  Problem # 2:  HYPERLIPIDEMIA (ICD-272.4) Assessment: Comment Only Check FLP and liver today His updated medication list for this problem includes:    Simvastatin 10 Mg Tabs (Simvastatin) .Marland Kitchen... Take one tablet at bedtime  Problem # 3:  ANXIETY STATE, UNSPECIFIED (ICD-300.00) Assessment: Improved Stable on current meds, continue the same His updated medication list for this problem includes:    Citalopram Hydrobromide 20 Mg Tabs (Citalopram hydrobromide) .Marland Kitchen... 1 tab by mouth daily    Alprazolam 0.5 Mg Tabs (Alprazolam) .Marland Kitchen... 1 tab as needed for anxiety  Problem # 4:  SLEEP APNEA (ICD-780.57) Assessment: Comment Only Not wearing CPAP, I encouraged patient to wear CPAP  Problem # 5:  HYPERTENSION (ICD-401.9) Assessment: Improved BP stable, continue same.  His updated medication list for this problem includes:    Micardis 40 Mg Tabs (Telmisartan) .Marland Kitchen... 1 by mouth qd  BP today: 112/60 Prior BP: 126/76 (03/30/2009)  Labs Reviewed: K+: 4.2 (05/25/2008) Creat: : 1.0 (05/25/2008)   Chol: 142 (11/30/2008)   HDL: 26.60 (11/30/2008)   LDL: 86 (11/30/2008)   TG: 146.0 (11/30/2008)  Complete Medication List: 1)  Baby Aspirin 81 Mg Chew (Aspirin) 2)  Micardis 40 Mg Tabs (Telmisartan) .Marland Kitchen.. 1 by mouth qd 3)  Citalopram Hydrobromide 20 Mg Tabs (Citalopram hydrobromide) .Marland Kitchen.. 1 tab by mouth daily 4)  Alprazolam 0.5 Mg Tabs (Alprazolam) .Marland Kitchen.. 1 tab as needed for anxiety 5)  Simvastatin 10 Mg Tabs (Simvastatin) .... Take one tablet at bedtime  Patient Instructions: 1)  Complete your lab work today prior to leaving. 2)  It is important that you exercise regularly at least 20 minutes 5 times a week. If you develop chest pain, have severe difficulty breathing, or feel very tired , stop exercising immediately and seek medical attention. 3)  You need to lose weight. Consider a lower calorie diet and regular exercise.  4)   Please schedule a follow-up appointment in 6 months. Prescriptions: SIMVASTATIN 10 MG TABS (SIMVASTATIN) take one tablet at bedtime Brand medically necessary #30 x 5   Entered and Authorized by:   Lemont Fillers FNP   Signed by:   Lemont Fillers FNP on 07/02/2009   Method used:   Electronically to        [facility] (retail)       [address]       [place], [place]  [address]       Ph: [phone]       Fax: [phone]   RxID:   [id number] MICARDIS 40 MG  TABS (TELMISARTAN) 1 by mouth qd  #30 x 5   Entered and Authorized by:   Lemont Fillers FNP   Signed by:   Lemont Fillers FNP on 07/02/2009   Method used:   Electronically to        [facility] (retail)       [address]       [place], [place]  [address]       Ph: [phone]       Fax: [phone]   RxID:   [id number]

## 2010-06-25 NOTE — Progress Notes (Signed)
Summary: MICARDIS REFILL  Phone Note Refill Request Message from:  Kevan Rosebush on February 11, 2010 1:24 PM  Refills Requested: Medication #1:  MICARDIS 40 MG  TABS 1 by mouth qd [facility] [place]    PT PHONE--[phone]  Initial call taken by: Jerolyn Shin,  February 11, 2010 1:25 PM    Prescriptions: MICARDIS 40 MG  TABS (TELMISARTAN) 1 by mouth qd  #30 x 5   Entered by:   Jeremy Johann CMA   Authorized by:   Loreen Freud DO   Signed by:   Jeremy Johann CMA on 02/11/2010   Method used:   Faxed to ...       [facility] (retail)       [address]       [place], [place]  [address]       Ph: [phone]       Fax: [phone]   RxID:   [id number]

## 2010-06-25 NOTE — Progress Notes (Signed)
Summary: ALPRAZOLAM REFILL  Phone Note Refill Request Message from:  Fax from Pharmacy on January 29, 2010 2:15 PM  Refills Requested: Medication #1:  ALPRAZOLAM 0.5 MG  TABS 1 tab as needed for [address] Tresa Endo [phone]    QTY = 30  Initial call taken by: Jerolyn Shin,  January 29, 2010 2:19 PM  Follow-up for Phone Call        Last OV 10/11/09 and Last filled 11/28/09 no f/u visits scheduled. Please Advise. Almeta Monas CMA [facility])  January 29, 2010 2:43 PM   Additional Follow-up for Phone Call Additional follow up Details #1::        refill x1  Additional Follow-up by: Loreen Freud DO,  January 29, 2010 3:08 PM    Additional Follow-up for Phone Call Additional follow up Details #2::    Rx faxed. Pt aware.  Almeta Monas CMA Duncan Dull)  January 29, 2010 3:25 PM   Prescriptions: ALPRAZOLAM 0.5 MG  TABS (ALPRAZOLAM) 1 tab as needed for anxiety  #30 x 0   Entered by:   Almeta Monas CMA (AAMA)   Authorized by:   Loreen Freud DO   Signed by:   Almeta Monas CMA (AAMA) on 01/29/2010   Method used:   Printed then faxed to ...       [facility] (retail)       [address]       [place], [place]  [address]       Ph: [phone]       Fax: [phone]   RxID:   [id number]

## 2010-06-25 NOTE — Progress Notes (Signed)
Summary: ALPRAZOLAM REFILL  Phone Note Refill Request Call back at [phone] Message from:  Patient on May 01, 2010 9:39 AM  Refills Requested: Medication #1:  ALPRAZOLAM 0.5 MG  TABS 1 tab as needed for anxiety   Dosage confirmed as above?Dosage Confirmed   Supply Requested: 1 month   Last Refilled: 02/25/2010   Notes: PATIENT DID CONTACT [facility] ON 04-29-2010 FOR REFILL PRIOR TO CONTACTING OUR OFFICE SEND TO [facility]  Next Appointment Scheduled: NONE Initial call taken by: Magdalen Spatz [facility],  May 01, 2010 9:40 AM  Follow-up for Phone Call        last seen 10/11/09 and filled 03/27/10.Marland Kitchen Please advise Follow-up by: Almeta Monas CMA Duncan Dull),  May 01, 2010 1:58 PM  Additional Follow-up for Phone Call Additional follow up Details #1::        refill x1 Additional Follow-up by: Loreen Freud DO,  May 01, 2010 2:07 PM    Prescriptions: ALPRAZOLAM 0.5 MG  TABS (ALPRAZOLAM) 1 tab as needed for anxiety  #30 x 0   Entered by:   Almeta Monas CMA (AAMA)   Authorized by:   Loreen Freud DO   Signed by:   Almeta Monas CMA (AAMA) on 05/01/2010   Method used:   Printed then faxed to ...       [facility] (retail)       [address]       [place], [place]  [address]       Ph: [phone]       Fax: [phone]   RxID:   [id number]

## 2010-06-25 NOTE — Progress Notes (Signed)
Summary: refill  Phone Note Refill Request Message from:  Fax from Pharmacy on September 11, 2009 10:37 AM  Refills Requested: Medication #1:  CITALOPRAM HYDROBROMIDE 20 MG TABS 1 tab by mouth daily [facility] fax [phone]   Method Requested: Fax to Local Pharmacy Next Appointment Scheduled: no appt Initial call taken by: Barb Merino,  September 11, 2009 10:38 AM    Prescriptions: CITALOPRAM HYDROBROMIDE 20 MG TABS (CITALOPRAM HYDROBROMIDE) 1 tab by mouth daily  #30 x 3   Entered by:   Kandice Hams   Authorized by:   Neena Rhymes MD   Signed by:   Kandice Hams on 09/11/2009   Method used:   Faxed to ...       [facility] (retail)       [address]       [place], [place]  [address]       Ph: [phone]       Fax: [phone]   RxID:   [id number]

## 2010-06-27 NOTE — Progress Notes (Signed)
Summary: Refill  Phone Note Refill Request Call back at [phone] Message from:  Pharmacy on June 18, 2010 8:37 AM  Refills Requested: Medication #1:  SIMVASTATIN 10 MG TABS take one tablet at bedtime [BMN]   Dosage confirmed as above?Dosage Confirmed   Supply Requested: 1 month [facility]  Next Appointment Scheduled: none Initial call taken by: Lavell Islam,  June 18, 2010 8:38 AM    New/Updated Medications: SIMVASTATIN 10 MG TABS (SIMVASTATIN) take one tablet at bedtime *LABS ARE DUE NOW* [BMN] Prescriptions: SIMVASTATIN 10 MG TABS (SIMVASTATIN) take one tablet at bedtime *LABS ARE DUE NOW* Brand medically necessary #30 x 0   Entered by:   Almeta Monas CMA (AAMA)   Authorized by:   Loreen Freud DO   Signed by:   Almeta Monas CMA (AAMA) on 06/18/2010   Method used:   Faxed to ...       [facility] (retail)       [address]       [place], [place]  [address]       Ph: [phone]       Fax: [phone]   RxID:   [id number]

## 2010-06-27 NOTE — Progress Notes (Signed)
Summary: REFILL  Phone Note Refill Request Call back at [phone] Message from:  Pharmacy on May 28, 2010 11:45 AM  Refills Requested: Medication #1:  ALPRAZOLAM 0.5 MG  TABS 1 tab as needed for anxiety   Dosage confirmed as above?Dosage Confirmed   Supply Requested: 1 month [facility]  Next Appointment Scheduled: NONE Initial call taken by: Lavell Islam,  May 28, 2010 11:45 AM  Follow-up for Phone Call        last seen 10/11/09 and filled 05/01/10.Marland KitchenMarland Kitchenplease advise Follow-up by: Almeta Monas CMA Duncan Dull),  May 28, 2010 4:44 PM  Additional Follow-up for Phone Call Additional follow up Details #1::        refill x1 Additional Follow-up by: Loreen Freud DO,  May 28, 2010 4:50 PM    Prescriptions: ALPRAZOLAM 0.5 MG  TABS (ALPRAZOLAM) 1 tab as needed for anxiety  #30 x 0   Entered by:   Almeta Monas CMA (AAMA)   Authorized by:   Loreen Freud DO   Signed by:   Almeta Monas CMA (AAMA) on 05/28/2010   Method used:   Printed then faxed to ...       [facility] (retail)       [address]       [place], [place]  [address]       Ph: [phone]       Fax: [phone]   RxID:   [id number]

## 2010-06-27 NOTE — Progress Notes (Signed)
Summary: OV-Prinout  Phone Note Call from Patient   Caller: Patient Summary of Call: Patient request that his OV from 1/16 be mailed to him. OV printed and mailed. Initial call taken by: Lavell Islam,  June 11, 2010 8:18 AM

## 2010-06-27 NOTE — Assessment & Plan Note (Signed)
Summary: cough//congestion//lch   Vital Signs:  Patient profile:   60 year old male Height:      68 inches Weight:      241.0 pounds BMI:     36.78 Temp:     97.3 degrees F oral BP sitting:   108 / 62  (right arm) Cuff size:   large  Vitals Entered By: Almeta Monas CMA Duncan Dull) (June 10, 2010 11:36 AM) CC: Needs meds refilled---Fasting and labs are due---- c/o cough and sinus drainage x3weeks , URI symptoms   History of Present Illness:       This is a 60 year old man who presents with URI symptoms.  The symptoms began 3 weeks ago.  Pt c/o B/L sinus pressure/ ha.  Pt took leftover cough syrup with some relief.  The patient complains of nasal congestion, purulent nasal discharge, and productive cough, but denies clear nasal discharge, sore throat, dry cough, earache, and sick contacts.  Associated symptoms include fever.  The patient denies low-grade fever (<100.5 degrees), fever of 100.5-103 degrees, fever of 103.1-104 degrees, fever to >104 degrees, stiff neck, dyspnea, wheezing, rash, vomiting, diarrhea, use of an antipyretic, and response to antipyretic.  The patient also reports headache.  The patient denies itchy watery eyes, itchy throat, sneezing, seasonal symptoms, response to antihistamine, muscle aches, and severe fatigue.  The patient denies the following risk factors for Strep sinusitis: unilateral facial pain, unilateral nasal discharge, poor response to decongestant, double sickening, tooth pain, Strep exposure, tender adenopathy, and absence of cough.    Current Medications (verified): 1)  Baby Aspirin 81 Mg  Chew (Aspirin) 2)  Micardis 40 Mg  Tabs (Telmisartan) .Marland Kitchen.. 1 By Mouth Qd 3)  Citalopram Hydrobromide 20 Mg Tabs (Citalopram Hydrobromide) .Marland Kitchen.. 1 Tab By Mouth Daily 4)  Alprazolam 0.5 Mg  Tabs (Alprazolam) .Marland Kitchen.. 1 Tab As Needed For Anxiety 5)  Simvastatin 10 Mg Tabs (Simvastatin) .... Take One Tablet At Bedtime 6)  Flonase 50 Mcg/act Susp (Fluticasone Propionate) ....  2 Sprays Each Nostril Once Daily 7)  Augmentin 875-125 Mg Tabs (Amoxicillin-Pot Clavulanate) .Marland Kitchen.. 1 By Mouth Two Times A Day 8)  Cheratussin Ac 100-10 Mg/44ml Syrp (Guaifenesin-Codeine) .Marland Kitchen.. 1-2 Tsp By Mouth At Bedtime As Needed Cough  Allergies (verified): No Known Drug Allergies  Past History:  Past Medical History: Last updated: 11/24/2008 Hypertension Headache Back pain anxiety  Past Surgical History: Last updated: 05/22/2008 none  Family History: Last updated: 03/04/2007 pancreatic cancer Family History Diabetes 1st degree relative Family History Hypertension  Social History: Last updated: 03/04/2007 Occupation: [employer] Married Never Smoked Alcohol use-no Drug use-no Regular exercise-no  Risk Factors: Exercise: no (03/04/2007)  Risk Factors: Smoking Status: never (03/04/2007)  Family History: Reviewed history from 03/04/2007 and no changes required. pancreatic cancer Family History Diabetes 1st degree relative Family History Hypertension  Social History: Reviewed history from 03/04/2007 and no changes required. Occupation: [employer] Married Never Smoked Alcohol use-no Drug use-no Regular exercise-no  Review of Systems      See HPI  Physical Exam  General:  Well-developed,well-nourished,in no acute distress; alert,appropriate and cooperative throughout examination Ears:  External ear exam shows no significant lesions or deformities.  Otoscopic examination reveals clear canals, tympanic membranes are intact bilaterally without bulging, retraction, inflammation or discharge. Hearing is grossly normal bilaterally. Nose:  L frontal sinus tenderness, L maxillary sinus tenderness, R frontal sinus tenderness, and R maxillary sinus tenderness.   Mouth:  Oral mucosa and oropharynx without lesions or exudates.  Teeth in good repair. Neck:  No deformities, masses, or tenderness noted. Lungs:  Normal respiratory  effort, chest expands symmetrically. Lungs are clear to auscultation, no crackles or wheezes. Heart:  normal rate and no murmur.     Impression & Recommendations:  Problem # 1:  SINUSITIS- ACUTE-NOS (ICD-461.9)  The following medications were removed from the medication list:    Cheratussin Ac 100-10 Mg/28ml Syrp (Guaifenesin-codeine) .Marland Kitchen... 1-2 by mouth at bedtime as needed His updated medication list for this problem includes:    Flonase 50 Mcg/act Susp (Fluticasone propionate) .Marland Kitchen... 2 sprays each nostril once daily    Augmentin 875-125 Mg Tabs (Amoxicillin-pot clavulanate) .Marland Kitchen... 1 by mouth two times a day    Cheratussin Ac 100-10 Mg/45ml Syrp (Guaifenesin-codeine) .Marland Kitchen... 1-2 tsp by mouth at bedtime as needed cough  Instructed on treatment. Call if symptoms persist or worsen.   Complete Medication List: 1)  Baby Aspirin 81 Mg Chew (Aspirin) 2)  Micardis 40 Mg Tabs (Telmisartan) .Marland Kitchen.. 1 by mouth qd 3)  Citalopram Hydrobromide 20 Mg Tabs (Citalopram hydrobromide) .Marland Kitchen.. 1 tab by mouth daily 4)  Alprazolam 0.5 Mg Tabs (Alprazolam) .Marland Kitchen.. 1 tab as needed for anxiety 5)  Simvastatin 10 Mg Tabs (Simvastatin) .... Take one tablet at bedtime 6)  Flonase 50 Mcg/act Susp (Fluticasone propionate) .... 2 sprays each nostril once daily 7)  Augmentin 875-125 Mg Tabs (Amoxicillin-pot clavulanate) .Marland Kitchen.. 1 by mouth two times a day 8)  Cheratussin Ac 100-10 Mg/44ml Syrp (Guaifenesin-codeine) .Marland Kitchen.. 1-2 tsp by mouth at bedtime as needed cough Prescriptions: CHERATUSSIN AC 100-10 MG/5ML SYRP (GUAIFENESIN-CODEINE) 1-2 tsp by mouth at bedtime as needed cough  #6 oz x 0   Entered and Authorized by:   Loreen Freud DO   Signed by:   Loreen Freud DO on 06/10/2010   Method used:   Print then Give to Patient   RxID:   [id number] FLONASE 50 MCG/ACT SUSP (FLUTICASONE PROPIONATE) 2 sprays each nostril once daily  #1 x 5   Entered and Authorized by:   Loreen Freud DO   Signed by:   Loreen Freud DO on 06/10/2010    Method used:   Electronically to        [facility] (retail)       [address]       [place], [place]  [address]       Ph: [phone]       Fax: [phone]   RxID:   [id number] AUGMENTIN 875-125 MG TABS (AMOXICILLIN-POT CLAVULANATE) 1 by mouth two times a day  #20 x 0   Entered and Authorized by:   Loreen Freud DO   Signed by:   Loreen Freud DO on 06/10/2010   Method used:   Electronically to        [facility] (retail)       [address]       [place], [place]  [address]       Ph: [phone]       Fax: [phone]   RxID:   [id number]    Orders Added: 1)  Est. Patient Level III [[address]]

## 2010-06-27 NOTE — Progress Notes (Signed)
Summary: Refill Request  Phone Note Refill Request Call back at [phone] Message from:  Pharmacy on May 13, 2010 9:34 AM  Refills Requested: Medication #1:  CITALOPRAM HYDROBROMIDE 20 MG TABS 1 tab by mouth daily   Dosage confirmed as above?Dosage Confirmed   Supply Requested: 1 month   Last Refilled: 04/15/2010 [facility]  Next Appointment Scheduled: none Initial call taken by: Harold Barban,  May 13, 2010 9:34 AM    Prescriptions: CITALOPRAM HYDROBROMIDE 20 MG TABS (CITALOPRAM HYDROBROMIDE) 1 tab by mouth daily  #30 x 3   Entered by:   Doristine Devoid CMA   Authorized by:   Loreen Freud DO   Signed by:   Doristine Devoid CMA on 05/13/2010   Method used:   Electronically to        [facility] (retail)       [address]       [place], [place]  [address]       Ph: [phone]       Fax: [phone]   RxID:   [id number]

## 2010-07-17 ENCOUNTER — Telehealth (INDEPENDENT_AMBULATORY_CARE_PROVIDER_SITE_OTHER): Payer: Self-pay | Admitting: *Deleted

## 2010-07-17 ENCOUNTER — Encounter (INDEPENDENT_AMBULATORY_CARE_PROVIDER_SITE_OTHER): Payer: Self-pay | Admitting: *Deleted

## 2010-07-23 NOTE — Letter (Signed)
Summary: Primary Care Appointment Letter  [facility] [place]  [address] [place], [place] [address]   Phone: [phone]  Fax: [phone]    07/17/2010 MRN: [id number]    Taylor Harvey [address] [place], [place]  [address]    Dear Taylor Harvey,   Your Primary Care Physician Taylor Freud DO has indicated that:    ___x____it is time to schedule an appointment for a physical exam and fasting labs.    _______you missed your appointment on______ and need to call and          reschedule.    _______you need to have lab work done.    _______you need to schedule an appointment discuss lab or test results.    _______you need to call to reschedule your appointment that is                       scheduled on _________.     Please call our office as soon as possible. Our phone number is [phone]. Please press option 1. Our office is open 8a-5p, Monday through Friday.     Thank you,    [facility]

## 2010-07-23 NOTE — Progress Notes (Signed)
Summary: refill  Phone Note Refill Request Message from:  Fax from Pharmacy on July 17, 2010 11:53 AM  Refills Requested: Medication #1:  SIMVASTATIN 10 MG TABS take one tablet at bedtime *LABS ARE DUE NOW* [BMN] [facility] - fax [phone]  Initial call taken by: Okey Regal [place],  July 17, 2010 11:54 AM    Prescriptions: SIMVASTATIN 10 MG TABS (SIMVASTATIN) take one tablet at bedtime *LABS ARE DUE NOW* Brand medically necessary #30 x 0   Entered by:   Almeta Monas CMA (AAMA)   Authorized by:   Loreen Freud DO   Signed by:   Almeta Monas CMA (AAMA) on 07/17/2010   Method used:   Electronically to        [facility] (retail)       [address]       [place], [place]  [address]       Ph: [phone]       Fax: [phone]   RxID:   [id number]

## 2010-08-05 ENCOUNTER — Telehealth: Payer: Self-pay | Admitting: Family Medicine

## 2010-08-06 ENCOUNTER — Encounter: Payer: Self-pay | Admitting: Internal Medicine

## 2010-08-06 ENCOUNTER — Ambulatory Visit (INDEPENDENT_AMBULATORY_CARE_PROVIDER_SITE_OTHER): Payer: BC Managed Care – PPO | Admitting: Internal Medicine

## 2010-08-06 DIAGNOSIS — R197 Diarrhea, unspecified: Secondary | ICD-10-CM

## 2010-08-13 ENCOUNTER — Other Ambulatory Visit: Payer: Self-pay | Admitting: Family Medicine

## 2010-08-13 MED ORDER — SIMVASTATIN 10 MG PO TABS
10.0000 mg | ORAL_TABLET | Freq: Every evening | ORAL | Status: DC
Start: 2010-08-13 — End: 2010-09-06

## 2010-08-13 MED ORDER — TELMISARTAN 40 MG PO TABS: 40.0000 mg | ORAL_TABLET | Freq: Every day | ORAL | Status: DC

## 2010-08-13 NOTE — Progress Notes (Signed)
Summary: Refill  Phone Note Refill Request Call back at [phone] Message from:  Pharmacy on August 05, 2010 12:34 PM  Refills Requested: Medication #1:  ALPRAZOLAM 0.5 MG  TABS 1 tab as needed for anxiety   Dosage confirmed as above?Dosage Confirmed   Supply Requested: 1 month   Last Refilled: 05/28/2010 [facility]  Initial call taken by: Lavell Islam,  August 05, 2010 12:34 PM  Follow-up for Phone Call        last seen 06/10/10 and filled 05/28/10 please advise. Follow-up by: Almeta Monas CMA Duncan Dull),  August 05, 2010 1:37 PM  Additional Follow-up for Phone Call Additional follow up Details #1::        refill x1  1 refills Additional Follow-up by: Loreen Freud DO,  August 05, 2010 1:39 PM    Prescriptions: ALPRAZOLAM 0.5 MG  TABS (ALPRAZOLAM) 1 tab as needed for anxiety  #30 x 1   Entered by:   Almeta Monas CMA (AAMA)   Authorized by:   Loreen Freud DO   Signed by:   Almeta Monas CMA (AAMA) on 08/05/2010   Method used:   Printed then faxed to ...       [facility] (retail)       [address]       [place], [place]  [id number]       Ph: [phone]       Fax: [phone]   RxID:   [id number]

## 2010-08-13 NOTE — Assessment & Plan Note (Signed)
Summary: vomiting,dirrehea/cbs   Vital Signs:  Patient profile:   60 year old male Weight:      236 pounds BMI:     36.01 Temp:     98.3 degrees F oral Pulse rate:   78 / minute Resp:     16 per minute BP sitting:   118 / 72  (left arm)  Vitals Entered By: Doristine Devoid CMA (August 06, 2010 12:23 PM) CC: intermittent vomitting and diarhea, Diarrhea   Primary Care Provider:  Beverely Low  CC:  intermittent vomitting and diarhea and Diarrhea.  History of Present Illness:      Onset 08/04/2010  as  N&V @ 3 am  followed by diarrhea . No vomiting since  03/11 but recurrent diarrhea. He  reports 4-6 stools per day, watery/unformed stools, voluminous stools, malodorous stools, fecal urgency, fecal soiling, fasting diarrhea, gassiness, and abrupt onset of symptoms, but denies blood in stool, greasy stools, alternating diarrhea/constipation, nocturnal diarrhea, and bloating.  The patient denies fever, abdominal pain, abdominal cramps, lightheadedness, increased thirst, joint pains, mouth ulcers, and eye redness.  The symptoms  have no triggers & no relievers ( no meds to date)  Patient has a  history of  colon polyps.  Patient's risk factors for diarrhea include recent antibiotic use,Amox 3 weeks ago. He does not have risks ZS:WFUXNA hospitalization, sick contact, eating suspicious food, eating undercooked meat, eating raw eggs, eating shellfish, international travel, and drinking contaminated water. Rx: Phenergan   Current Medications (verified): 1)  Baby Aspirin 81 Mg  Chew (Aspirin) 2)  Micardis 40 Mg  Tabs (Telmisartan) .Marland Kitchen.. 1 By Mouth Qd 3)  Citalopram Hydrobromide 20 Mg Tabs (Citalopram Hydrobromide) .Marland Kitchen.. 1 Tab By Mouth Daily 4)  Alprazolam 0.5 Mg  Tabs (Alprazolam) .Marland Kitchen.. 1 Tab As Needed For Anxiety 5)  Simvastatin 10 Mg Tabs (Simvastatin) .... Take One Tablet At Bedtime *labs Are Due Now* 6)  Flonase 50 Mcg/act Susp (Fluticasone Propionate) .... 2 Sprays Each Nostril Once Daily  Allergies  (verified): No Known Drug Allergies  Physical Exam  General:  well-nourished,in no acute distress; alert,appropriate and cooperative throughout examination Eyes:  No corneal or conjunctival inflammation noted.Marland Kitchen Perrla.No icterus  Mouth:  Oral mucosa and oropharynx without lesions or exudates.  Tongue coated. No pharyngeal erythema.   Neck:  No deformities, masses, or tenderness noted. Lungs:  Normal respiratory effort, chest expands symmetrically. Lungs are clear to auscultation, no crackles or wheezes. Heart:  Normal rate and regular rhythm. S1 and S2 normal without gallop, murmur, click, rub .S4 Abdomen:  Bowel sounds positive,abdomen soft and non-tender without masses, organomegaly.Umbilical  hernia  noted. Pulses:  R and L radial,dorsalis pedis and posterior tibial pulses are full and equal bilaterally Extremities:  No clubbing, cyanosis, edema, or deformity noted  Neurologic:  alert & oriented X3.   Skin:  Intact without suspicious lesions or rashesNo jaundice or tenting Cervical Nodes:  No lymphadenopathy noted Axillary Nodes:  No palpable lymphadenopathy Psych:  memory intact for recent and remote, normally interactive, and good eye contact.     Impression & Recommendations:  Problem # 1:  DIARRHEA (ICD-787.91)  His updated medication list for this problem includes:    Lonox 2.5-0.025 Mg Tabs (Diphenoxylate-atropine) .Marland Kitchen... 1 as needed for watery stool  Complete Medication List: 1)  Baby Aspirin 81 Mg Chew (Aspirin) 2)  Micardis 40 Mg Tabs (Telmisartan) .Marland Kitchen.. 1 by mouth qd 3)  Citalopram Hydrobromide 20 Mg Tabs (Citalopram hydrobromide) .Marland Kitchen.. 1 tab by mouth daily 4)  Alprazolam 0.5 Mg Tabs (Alprazolam) .Marland Kitchen.. 1 tab as needed for anxiety 5)  Simvastatin 10 Mg Tabs (Simvastatin) .... Take one tablet at bedtime *labs are due now* 6)  Flonase 50 Mcg/act Susp (Fluticasone propionate) .... 2 sprays each nostril once daily 7)  Lonox 2.5-0.025 Mg Tabs (Diphenoxylate-atropine) .Marland Kitchen.. 1 as  needed for watery stool 8)  Metronidazole 500 Mg Tabs (Metronidazole) .Marland Kitchen.. 1 three times a day  Patient Instructions: 1)  Align once daily until bowels are normal. 2)  Drink clear liquids only for the next 24 hours, then slowly add other liquids and food as you  tolerate them. Prescriptions: METRONIDAZOLE 500 MG TABS (METRONIDAZOLE) 1 three times a day  #21 x 0   Entered and Authorized by:   Marga Melnick MD   Signed by:   Marga Melnick MD on 08/06/2010   Method used:   Printed then faxed to ...       [facility] (retail)       [address]       [place], [place]  [id number]       Ph: [phone]       Fax: [phone]   RxID:   [id number] LONOX 2.5-0.025 MG TABS (DIPHENOXYLATE-ATROPINE) 1 as needed for watery stool  #10 x 0   Entered and Authorized by:   Marga Melnick MD   Signed by:   Marga Melnick MD on 08/06/2010   Method used:   Printed then faxed to ...       [facility] (retail)       [address]       [place], [place]  [id number]       Ph: [phone]       Fax: [phone]   RxID:   [id number]    Orders Added: 1)  Est. Patient Level IV [30160]

## 2010-08-13 NOTE — Telephone Encounter (Signed)
Duplicate request

## 2010-09-06 ENCOUNTER — Other Ambulatory Visit: Payer: Self-pay

## 2010-09-06 MED ORDER — SIMVASTATIN 10 MG PO TABS: 10.0000 mg | ORAL_TABLET | Freq: Every evening | ORAL | Status: DC

## 2010-09-16 ENCOUNTER — Other Ambulatory Visit: Payer: Self-pay

## 2010-09-16 MED ORDER — CITALOPRAM HYDROBROMIDE 20 MG PO TABS: 20.0000 mg | ORAL_TABLET | Freq: Every day | ORAL | Status: DC

## 2010-09-16 NOTE — Telephone Encounter (Signed)
Last seen 08/06/10 and filled 05/13/10 with 3 refills      Please advise    KP

## 2010-09-16 NOTE — Telephone Encounter (Signed)
Faxed by Dr.Lowne     KP

## 2010-10-02 ENCOUNTER — Ambulatory Visit (INDEPENDENT_AMBULATORY_CARE_PROVIDER_SITE_OTHER): Payer: BC Managed Care – PPO | Admitting: Family Medicine

## 2010-10-02 ENCOUNTER — Encounter: Payer: Self-pay | Admitting: Family Medicine

## 2010-10-02 VITALS — BP 124/68 | HR 80 | Temp 97.3°F | Wt 243.0 lb

## 2010-10-02 DIAGNOSIS — N390 Urinary tract infection, site not specified: Secondary | ICD-10-CM

## 2010-10-02 LAB — POCT URINALYSIS DIPSTICK
Glucose, UA: NEGATIVE
Nitrite, UA: NEGATIVE
Spec Grav, UA: 1.01
Urobilinogen, UA: 0.2
pH, UA: 6

## 2010-10-02 LAB — HEPATIC FUNCTION PANEL
ALT: 31 U/L (ref 0–53)
Bilirubin, Direct: 0.1 mg/dL (ref 0.0–0.3)
Total Bilirubin: 0.8 mg/dL (ref 0.3–1.2)
Total Protein: 6.6 g/dL (ref 6.0–8.3)

## 2010-10-02 LAB — BASIC METABOLIC PANEL
BUN: 8 mg/dL (ref 6–23)
CO2: 30 mEq/L (ref 19–32)
Calcium: 8.9 mg/dL (ref 8.4–10.5)
GFR: 89.93 mL/min (ref >60.00)
Glucose, Bld: 87 mg/dL (ref 70–99)
Sodium: 142 mEq/L (ref 135–145)

## 2010-10-02 LAB — PSA: PSA: 0.5 ng/mL (ref 0.10–4.00)

## 2010-10-02 MED ORDER — CIPROFLOXACIN HCL 500 MG PO TABS: 500.0000 mg | ORAL_TABLET | Freq: Two times a day (BID) | ORAL | Status: AC

## 2010-10-02 NOTE — Patient Instructions (Signed)
Urinary Tract Infection (UTI)   Infections of the urinary tract can start in several places. A bladder infection (cystitis), a kidney infection (pyelonephritis), and a prostate infection (prostatitis) are different types of urinary tract infections. They usually get better if treated with medicines (antibiotics) that kill germs. Take all the medicine until it is gone. You or your child may feel better in a few days, but TAKE ALL MEDICINE or the infection may not respond and may become more difficult to treat.   HOME CARE INSTRUCTIONS   Drink enough water and fluids to keep the urine clear or pale yellow. Cranberry juice is especially recommended, in addition to large amounts of water.   Avoid caffeine, tea, and carbonated beverages. They tend to irritate the bladder.   Alcohol may irritate the prostate.   Only take over-the-counter or prescription medicines for pain, discomfort, or fever as directed by your caregiver.   FINDING OUT THE RESULTS OF YOUR TEST   Not all test results are available during your visit. If your or your child's test results are not back during the visit, make an appointment with your caregiver to find out the results. Do not assume everything is normal if you have not heard from your caregiver or the medical facility. It is important for you to follow up on all test results.   TO PREVENT FURTHER INFECTIONS:   Empty the bladder often. Avoid holding urine for long periods of time.   After a bowel movement, women should cleanse from front to back. Use each tissue only once.   Empty the bladder before and after sexual intercourse.   SEEK MEDICAL CARE IF:   There is back pain.   You or your child has an oral temperature above 100.4.   Your baby is older than 3 months with a rectal temperature of 100.5º F (38.1° C) or higher for more than 1 day.   Your or your child's problems (symptoms) are no better in 3 days. Return sooner if you or your child is getting worse.   SEEK IMMEDIATE MEDICAL CARE IF:    There is severe back pain or lower abdominal pain.   You or your child develops chills.   You or your child has an oral temperature above 100.4, not controlled by medicine.   Your baby is older than 3 months with a rectal temperature of 102º F (38.9º C) or higher.   Your baby is 3 months old or younger with a rectal temperature of 100.4º F (38º C) or higher.   There is nausea or vomiting.   There is continued burning or discomfort with urination.   MAKE SURE YOU:   Understand these instructions.   Will watch this condition.   Will get help right away if you or your child is not doing well or gets worse.   Document Released: 02/19/2005 Document Re-Released: 08/06/2009   ExitCare® Patient Information ©2011 [employer].

## 2010-10-02 NOTE — Progress Notes (Signed)
  Subjective:    Patient ID: ZACKARIA BURKEY, male    DOB: [birth year], 61 y.o.   MRN: [id number]  HPI    Review of Systems     Objective:   Physical Exam        Assessment & Plan:   Subjective:    ANTONI STEFAN is a 60 y.o. male who complains of burning with urination, dysuria and hematuria. He has had symptoms for 3 days. Patient also complains of back pain. Patient denies congestion, cough and fever. Patient does not have a history of recurrent UTI. Patient does not have a history of pyelonephritis.   The following portions of the patient's history were reviewed and updated as appropriate: allergies, current medications, past family history, past medical history, past social history, past surgical history and problem list.  Review of Systems Pertinent items are noted in HPI.    Objective:    BP 124/68  Pulse 80  Temp(Src) 97.3 F (36.3 C) (Oral)  Wt 243 lb (110.224 kg) General appearance: alert, cooperative, appears stated age and no distress Neck: no adenopathy, supple, symmetrical, trachea midline and thyroid not enlarged, symmetric, no tenderness/mass/nodules Lungs: clear to auscultation bilaterally Heart: regular rate and rhythm, S1, S2 normal, no murmur, click, rub or gallop Male genitalia: normal Rectal: normal tone, normal prostate, no masses or tenderness  Laboratory:  Urine dipstick: mod for leukocyte esterase.     Tr blood Micro exam: not done.    Assessment:    Acute cystitis and UTI     Plan:    Medications: ciprofloxacin.  Check culture

## 2010-10-04 ENCOUNTER — Encounter: Payer: Self-pay | Admitting: *Deleted

## 2010-10-04 LAB — URINE CULTURE

## 2010-10-08 NOTE — Assessment & Plan Note (Signed)
[facility]                             PULMONARY OFFICE NOTE   NAME:Taylor Harvey, Taylor Harvey                     MRN:          [id number]  DATE:12/21/2006                            DOB:          1950/08/20    REASON FOR CONSULTATION:  Abnormal chest x-ray.   HISTORY:  This is a 60 year old white male who has been coughing for  several years off and on more in the spring than any other time but  presently having symptoms straight through into the hot summer weather  with paroxysm of cough that are partially improved with Nasonex and  Allegra and led to a chest x-ray suggesting emphysema and therefore is  seen at Dr. Laqueta Linden request.   Note, the patient has never smoked and has no family history of smoking.  The cough is dry in nature with a sensation of throat drainage and  mild nasal obstructive symptoms but no classic itching, sneezing, or  subjective wheezing.  He had none of these symptoms until several years  ago.  He also has been diagnosed with sleep apnea and says he cannot  tolerate being on the mask consistently and does have very mild daytime  hypersomnolence.  He has been seen by a cardiologist who told him  everything would be fine if he lost weight.  He has been by Dr. Lady Gary  who feels most of his problems are related to weight.   Presently the patient denies shortness of breath with anything more than  slow ADLs but says there is no real variability with weather,  environmental change, or even with the onset of spring pollen season  which does cause the nonspecific nasal complaints as noted above.   ALLERGIES:  None known.   MEDICATIONS:  Lisinopril, Nasonex, Allegra, and baby aspirin.   SOCIAL HISTORY:  He has never smoked (except as an adolescent casually).  He works for the [employer] with no unusual travel, pet, or  hobby exposure history.   FAMILY HISTORY:  Significant for the absence of emphysema as noted  above.   Positive for heart disease in mother, father, and 3 sisters.   REVIEW OF SYSTEMS:  Taken in detail on the work sheet, negative except  as outlined above.   PHYSICAL EXAMINATION:  This is an obese white male who admits he has  gained about 30 pounds over the last several years and up to 230 now,  his greatest weight ever.  He has normal vital signs.  HEENT:  Oropharynx is clear with no evidence of excessive postnasal  drainage or cobblestoning.  NECK:  Supple without cervical adenopathy or tenderness.  Trachea is  midline.  Lung fields are perfectly clear bilaterally to auscultation and  percussion.  Regular rate and rhythm without murmur, gallop, or rub present.  ABDOMEN:  Soft, benign.  EXTREMITIES:  Normal without calf tenderness, cyanosis, clubbing.   Hemoglobin saturation 96% on room air.   Chest x-ray suggests very minimum hyperinflation on November 13, 2006.   IMPRESSION:  Chronic cough and dyspnea on exertion are not likely to be  chronic obstructive pulmonary  disease or emphysema in a patient who  never smoked.  In fact, late onset allergies suggest allergies an  alternative explanation for all his problems and that is chronic ACE  inhibitor intolerance.  I found by trial and error that ACE inhibitors  do not particularly cause a problem but if one has another problem like  either reflux or allergic rhinitis, there can begin a cycle of  inflammation where the more throat clearing and coughing he does, the  more secondary inflammation ensues (because ACE inhibitors inhibit  bradykinin metabolism and clearance from the upper airway, and this is a  potent mediator of upper airways inflammation).   I have therefore recommended the following approach:  1. Stop Lisinopril in favor of Micardis 80 mg 1 daily.  2. On a trial basis, also stop Nasonex and see if Allegra 180 can be      used 1 daily p.r.n. seasonally.  If all of his nasal symptoms flare      off of Nasonex and ACE  inhibitors, then that would indicate there      is enough primary nasal inflammation to warrant continued      maintenance treatment.  I would like to see him back in 6 weeks      with PFTs to establish whether or not there is any functional air      flow obstruction (which would fit with mild chronic asthma in a      patient with longstanding primary rhinitis, but the history I got      was more of a more recent onset of rhinitis symptoms.   I have encouraged the patient to use a CPAP machine also.  I note  parenthetically that many patients who have poorly controlled sleep  apnea have ACE inhibitor intolerance and/or reflux.  I did not  specifically treat him for reflux today but would like to see to what  extent he improves off of ACE inhibitor before complicating matters any  further.  I did, however, review a reflux diet with him in detail.     Charlaine Dalton. Sherene Sires, MD, [facility]  Electronically Signed    MBW/MedQ  DD: 12/21/2006  DT: 12/22/2006  Job #: [id number]   cc:   Leanne Chang, M.D.

## 2010-10-08 NOTE — Assessment & Plan Note (Signed)
[facility]                             PULMONARY OFFICE NOTE   NAME:Taylor Harvey, Taylor Harvey                     MRN:          [id number]  DATE:02/02/2007                            DOB:          Jun 12, 1950    A 60 year old white male with cough, dyspnea dating back several years  who returns today as requested for PFTs.  I had taken the liberty of  discontinuing ACE inhibitors when I first saw him on July 28 and he says  the cough is completely gone.  He is presently on Micardis 80 one daily  with no significant chest pain, fevers, chills, sweats, orthopnea, PND  or leg swelling and dyspnea only with very heavy activity.   PHYSICAL EXAMINATION:  He is a pleasant ambulatory white male in no  acute distress.  He is afebrile, normal vital signs.  HEENT:  Unremarkable, oropharynx clear.  NECK:  Supple without cervical adenopathy, tenderness, trachea is  midline, no thyromegaly.  LUNGS:  Fields perfectly clear bilaterally to auscultation and  percussion.  Regular rhythm without murmur, gallop, rub.  ABDOMEN:  Soft, benign.  EXTREMITIES:  Warm without calf tenderness, cyanosis, clubbing, edema.   PFTs were reviewed today and are completely normal.   IMPRESSION:  No evidence of significant pulmonary disease in this never  smoker.  The shortness of breath now is more suggestive of  deconditioning to me than it is pulmonary or cardiac disease and I  reviewed with him options for reconditioning.  If his symptoms continue  a stress test needs to be considered, if not already performed since the  onset of his symptoms.   Pulmonary followup can be p.r.n.     Charlaine Dalton. Sherene Sires, MD, [facility]  Electronically Signed    MBW/MedQ  DD: 02/02/2007  DT: 02/03/2007  Job #: [id number]

## 2010-10-09 ENCOUNTER — Other Ambulatory Visit: Payer: Self-pay

## 2010-10-09 MED ORDER — ALPRAZOLAM 0.5 MG PO TABS: 0.5000 mg | ORAL_TABLET | Freq: Every evening | ORAL | Status: DC | PRN

## 2010-10-09 NOTE — Telephone Encounter (Signed)
Rx faxed

## 2010-10-09 NOTE — Telephone Encounter (Signed)
Last seen 10/02/10 and filled 09/06/10 please advise     KP

## 2010-10-11 NOTE — Op Note (Signed)
NAME:  [name], [name]                          ACCOUNT NO.:  [id number]   MEDICAL RECORD NO.:  [id number]                   PATIENT TYPE:  AMB   LOCATION:  [facility]                                  FACILITY:  [facility]   PHYSICIAN:  Katy Fitch. Naaman Plummer., M.D.          DATE OF BIRTH:  19-Apr-1951   DATE OF PROCEDURE:  08/01/2003  DATE OF DISCHARGE:                                 OPERATIVE REPORT   PREOPERATIVE DIAGNOSIS:  Stenosing tenosynovitis, right long finger at A1  pulley.   POSTOPERATIVE DIAGNOSIS:  Stenosing tenosynovitis, right long finger at A1  pulley.   OPERATION:  Release of right long finger A1 pulley.   OPERATING SURGEON:  Katy Fitch. Sypher, M.D.   ASSISTANT:  Jonni Sanger, P.A.   ANESTHESIA:  0.25% Marcaine and 2% lidocaine metacarpal-head level block,  right long finger, supplemented by IV sedation supervised by the  anesthesiologist, Janetta Hora. Gelene Mink, M.D.   INDICATIONS:  Taylor Harvey is a 59 year old gentleman referred for a  chronic stenosing tenosynovitis of his right long finger.   He had failed nonoperative measures including activity modification, anti-  inflammatory medication, and steroids.   Due to a failure to respond to nonoperative measures, he is brought to the  operating room at this time for release of his right long finger A1 pulley.   DESCRIPTION OF PROCEDURE:  Taylor Harvey is brought to the operating room  and placed in supine position upon the operating table.  Following light  sedation the right arm was prepped with Betadine soap and solution and  sterilely draped.   Following exsanguination of the right arm with an Esmarch bandage, an  arterial tourniquet over the proximal brachium was inflated to 220 mmHg.   The procedure commenced with a short incision directly distal to the A1  pulley.  Subcutaneous tissues are carefully divided, revealing the flexor  sheath.  The A1 pulley was isolated, split with scissors, and the tendons  delivered.   Thereafter full range of motion of the long finger was noted.   The wound was repaired with interrupted sutures of 5-0 nylon.   A compressive dressing applied with sterile gauze and an Ace wrap.   For aftercare Taylor Harvey is advised to begin immediate range of motion  exercises.  He will be given a prescription for Vicodin 5 mg one or two  tablets p.o. q.4-6h. p.r.n. pain for postoperative analgesia.  He will  return to our office for follow-up in a week for suture removal.                                               Katy Fitch. Naaman Plummer., M.D.    RVS/MEDQ  D:  08/01/2003  T:  08/02/2003  Job:  [id number]
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
## 2010-10-11 NOTE — Op Note (Signed)
NAME:  Taylor Harvey, Taylor Harvey              ACCOUNT NO.:  [id number]   MEDICAL RECORD NO.:  [id number]          PATIENT TYPE:  AMB   LOCATION:  ENDO                         FACILITY:  [facility]   PHYSICIAN:  James L. Malon Kindle., M.D.DATE OF BIRTH:  1950/12/29   DATE OF PROCEDURE:  05/24/2004  DATE OF DISCHARGE:                                 OPERATIVE REPORT   PROCEDURE PERFORMED:  Colonoscopy and polypectomy.   ENDOSCOPIST:  Llana Aliment. Edwards, M.D.   MEDICATIONS:  Fentanyl 100 mcg, Versed 10 mg IV.   INSTRUMENT USED:  Pediatric Olympus pediatric colonoscope.   INDICATIONS FOR PROCEDURE:  Rectal bleeding.   DESCRIPTION OF PROCEDURE:  The procedure had been explained to the patient  and consent obtained.  With the patient in the left lateral decubitus  position, the Olympus colonoscope was inserted and advanced.  Prep  excellent.  Cecum reached.  The ileocecal valve and appendiceal orifice were  seen.  The scope was withdrawn and the cecum, ascending colon, transverse  colon, descending and sigmoid colon were seen well.  No polyps were seen.  No significant diverticular disease.  In the rectum, there was a 0.75 cm  sessile polyp that was snared.  Sucked through the scope.  Just beyond that  there was a 3 mm polyp that was also removed with a snare and sucked through  the scope.  The scope was withdrawn.  The patient tolerated the procedure  well.   ASSESSMENT:  Rectal polyps removed 2113.   PLAN:  Routine.  Will check pathology and recommend repeating procedure if  adenomatous.       JLE/MEDQ  D:  05/24/2004  T:  05/24/2004  Job:  [id number]   cc:   Holley Bouche, M.D.  [address]  [place], [place] [address]  Fax: [phone]

## 2010-10-11 NOTE — Procedures (Signed)
NAME:  JABARI, SWOVELAND              ACCOUNT NO.:  [id number]   MEDICAL RECORD NO.:  [id number]          PATIENT TYPE:  OUT   LOCATION:  SLEEP CENTER                 FACILITY:  [facility]   PHYSICIAN:  Clinton D. Maple Hudson, M.D. DATE OF BIRTH:  03/23/1951   DATE OF STUDY:  03/22/2004                              NOCTURNAL POLYSOMNOGRAM   INDICATIONS FOR STUDY:  Hypersomnia with sleep apnea. Epworth sleepiness  score 16/24, neck size 18 inches, BMI 35.8, weight 237 pounds.   SLEEP ARCHITECTURE:  Total sleep time 351 minutes with sleep efficiency of  83 %. Stage I was 8%, stage II was 54%, stages II and IV were 19%, REM was  20% of total sleep time. Latency to sleep onset 16 minutes. Latency to REM  211 minutes. Awake after sleep onset 58 minutes. Arousal index 20.   RESPIRATORY DATA:  Split study protocol. RDI 21.5 indicating moderate  obstructive sleep apnea/hypopnea syndrome before CPAP. This included  obstructive apneas and 28 hypopneas before CPAP. Most events were while  sleeping supine. REM RDI was 2.6 per hour. CPAP was titrated to 11 CWP, RDI  2.1 per hour. A medium comfort gel mask was used with heated humidifier.   OXYGEN DATA:  Very loud snoring with oxygen desaturation to a nadir of 90%  before CPAP. After CPAP control, saturation ranged 95% to 97% on room air.   CARDIAC DATA:  Normal sinus rhythm.   MOVEMENT/PARASOMNIA:  A total of 199 limb jerks were recorded of which 27  were associated with arousal or awakening for a periodic limb movement with  arousal index of 4.6 per hour which is mildly increased.   IMPRESSION/RECOMMENDATIONS:  Moderate obstructive sleep apnea/hypopnea  syndrome, RDI 21.5 per hour with desaturation to 90%. CPAP control at 11  CWP, RDI 2.1 per hour using a medium comfort gel mask with heated  humidifier. Periodic limb movement with arousal syndrome, 4.6 per hour.                                                           Clinton D. Maple Hudson, M.D.  Diplomate, [employer] Board   CDY/MEDQ  D:  03/24/2004 12:54:24  T:  03/24/2004 23:59:53  Job:  [id number]

## 2010-10-11 NOTE — Assessment & Plan Note (Signed)
[facility]                          [place] OFFICE NOTE   NAME:HODGESAntar, Harvey                     MRN:          [id number]  DATE:01/27/2006                            DOB:          July 03, 1950    REASON FOR VISIT:  Establish care.   Mr. Taylor Harvey is a 60 year old male here to establish care.  He has a history  of hypertension which he takes lisinopril daily at 20 mg.  His last complete  physical examination was two months ago.  He has no specific complaints  today.   PAST MEDICAL HISTORY:  1. Hypertension.  2. Rectal polyps.  3. Kidney stone.  4. Sleep apnea.   MEDICATIONS:  1. Lisinopril 20 mg daily.  2. Aspirin.   FAMILY HISTORY:  Father deceased at 48 secondary to CVA and congestive heart  failure.  Mother died at 79 with a history of type 2 diabetes and coronary  artery disease.  He has one brother and eight sisters, one with coronary  artery disease.   SOCIAL HISTORY:  He works with the [employer].  He is married with  four adoptive children.  Denies any alcohol or tobacco use.   REVIEW OF SYSTEMS:  Unremarkable.   OBJECTIVE:  VITAL SIGNS:  Weight 241, pulse 64, blood pressure 108/64.  GENERAL:  Pleasant male in no acute distress.  Answers questions  appropriately.  Alert and oriented x3.  NECK:  Supple, no lymphadenopathy, carotid bruits, or JVD.  LUNGS:  Clear with good air movement.  HEART:  Regular rate and rhythm with normal S1 and S2.  No murmurs, rubs, or  gallops.  EXTREMITIES:  No clubbing, cyanosis, or edema.   IMPRESSION:  Hypertension.   PLAN:  1. The patient is to continue current dose of lisinopril, but currently      does not need refills.  2. He is to follow with me in three months.  3. We will obtain medical records from his previous physician.                                   Taylor Harvey, M.D.   LA/MedQ  DD:  01/28/2006  DT:  01/29/2006  Job #:  [id number]

## 2010-10-11 NOTE — Op Note (Signed)
NAME:  Taylor Harvey, Taylor Harvey                          ACCOUNT NO.:  [id number]   MEDICAL RECORD NO.:  [id number]                   PATIENT TYPE:  AMB   LOCATION:  [facility]                                 FACILITY:  [facility]   PHYSICIAN:  Mark C. Vernie Ammons, M.D.               DATE OF BIRTH:  1950/07/08   DATE OF PROCEDURE:  09/27/2003  DATE OF DISCHARGE:                                 OPERATIVE REPORT   PREOPERATIVE DIAGNOSIS:  Left ureteral calculus.   POSTOPERATIVE DIAGNOSIS:  1. History of left ureteral calculus.  2. Bladder calculus.  3. Urethral stricture.   PROCEDURE:  Cystoscopy, urethral dilatation, extraction of bladder stone,  left retrograde pyelogram, and left ureteroscopy surgery.   SURGEON:  Mark C. Vernie Ammons, M.D.   ANESTHESIA:  General.   BLOOD LOSS:  Less than 1 cc.   SPECIMENS:  Stone.   DRAINS:  None.   COMPLICATIONS:  None.   INDICATIONS:  Patient is a 60 year old white male who I saw in the office  on Sep 25, 2003 for further evaluation of a kidney stone.  The patient did  develop right upper quadrant pain and a CT scan was obtained that actually  revealed a stone on the contralateral side at the UPJ.  The patient states  that he has passed approximately 20 stones over the years and has required  lithotripsy one time; however, the CT scan did not reveal any further renal  calculi.  The KUB in my office revealed that the stone appeared to be in the  distal ureter.  When I saw the patient in the preop holding area, he  continued to have left-sided pain and had not seen the stone pass.  He is  brought to the OR today for evaluation and extraction of his ureteral  calculus.  He reports no significant obstructive voiding symptoms.   PROCEDURE:  After informed consent, the patient is brought to the major OR  and placed on the table.  Administered general anesthesia and then moved  into the dorsal lithotomy position.  The genitalia was sterilely prepped and  draped, and a  22 French cystoscope with 12 degree lenses was inserted into  the urethra.  As I passed the scope down into the bulbar urethral region, I  noted a tight urethral stricture that would not allow the scope passage  beyond this.  I therefore removed the cystoscope and dilated the area with  Sissy Hoff sounds to 30 [place] without difficulty.  I then reinserted the  cystoscope and noted the stricture is completely dilated and essentially  obliterated.  No significant urethral injury was noted.  I then passed the  scope on in through the prostatic urethra through the open calcification  within the prostatic mucosa that were punctate in size.  The prostate had a  fairly high bladder neck, but the bladder appeared to be lined with normal-  appearing pink mucosa  with no lesions or tumors.  On the floor of the  bladder was a single 4 mm calculus.  No other stones were seen.  The  orifices were normal configuration and position.   There appeared to be a calcification present on plain fluoroscopy in the  area of the left distal ureter, so I performed a retrograde pyelogram with  an 8 French cone-tipped ureteral catheter.  This revealed some mild  dilatation in the proximal ureter, tapering at about the location of the  calcific density seen on plain film, so I felt it would be best to evaluate  this visually with the ureteroscope.  The 6 French rigid ureteroscope was  then passed per the urethra into the left orifice after a guidewire had been  placed in the area of the renal pelvis under fluoroscopic control.  As I  passed the ureteroscope into the ureter, I was able to visualize it and  noted no stones.  As I got to the area where I had previously seen the  transition from dilated ureter to normal-appearing ureter, I saw no stone.  I therefore repeated the retrograde pyelogram to be sure that the stone had  not migrated proximally, and found that this was not the case, no stone was  noted within the  ureter.  I therefore removed the ureteroscope and the  guidewire and reinserted the cystoscope.  I grasped the stone with alligator  forceps and removed that.  I then drained the bladder, and the patient was  awakened and taken to the recovery room in stable, satisfactory condition.  He tolerated the procedure well with no intraoperative complication.   He will be given a prescription for 12 200 mg Pyridium and will follow up in  my office in one week.  He did receive 200 mg of Cipro IV preoperatively.                                               Mark C. Vernie Ammons, M.D.    MCO/MEDQ  D:  09/27/2003  T:  09/27/2003  Job:  [id number]

## 2010-10-24 ENCOUNTER — Other Ambulatory Visit: Payer: Self-pay

## 2010-10-24 MED ORDER — SIMVASTATIN 10 MG PO TABS: 10.0000 mg | ORAL_TABLET | Freq: Every evening | ORAL | Status: DC

## 2010-11-08 ENCOUNTER — Other Ambulatory Visit: Payer: Self-pay | Admitting: Family Medicine

## 2010-11-08 MED ORDER — ALPRAZOLAM 0.5 MG PO TABS: 0.5000 mg | ORAL_TABLET | Freq: Every evening | ORAL | Status: DC | PRN

## 2010-11-08 NOTE — Telephone Encounter (Signed)
Rx faxed.    KP 

## 2010-11-08 NOTE — Telephone Encounter (Signed)
Last seen 10/02/10 and filled 10/09/10 quantity # 30 please advise    KP---- Dr.Lowne's patient

## 2010-11-28 ENCOUNTER — Other Ambulatory Visit: Payer: Self-pay | Admitting: Family Medicine

## 2010-12-06 ENCOUNTER — Ambulatory Visit (INDEPENDENT_AMBULATORY_CARE_PROVIDER_SITE_OTHER): Payer: BC Managed Care – PPO | Admitting: Family Medicine

## 2010-12-06 ENCOUNTER — Encounter: Payer: Self-pay | Admitting: Family Medicine

## 2010-12-06 VITALS — BP 122/62 | HR 79 | Temp 98.6°F | Wt 241.0 lb

## 2010-12-06 DIAGNOSIS — E785 Hyperlipidemia, unspecified: Secondary | ICD-10-CM

## 2010-12-06 DIAGNOSIS — N390 Urinary tract infection, site not specified: Secondary | ICD-10-CM

## 2010-12-06 DIAGNOSIS — F419 Anxiety disorder, unspecified: Secondary | ICD-10-CM

## 2010-12-06 DIAGNOSIS — I10 Essential (primary) hypertension: Secondary | ICD-10-CM

## 2010-12-06 LAB — POCT URINALYSIS DIPSTICK
Bilirubin, UA: NEGATIVE
Glucose, UA: NEGATIVE
Nitrite, UA: NEGATIVE
Urobilinogen, UA: 0.2

## 2010-12-06 MED ORDER — SIMVASTATIN 10 MG PO TABS: 10.0000 mg | ORAL_TABLET | Freq: Every day | ORAL | Status: DC

## 2010-12-06 MED ORDER — TELMISARTAN 40 MG PO TABS: 40.0000 mg | ORAL_TABLET | Freq: Every day | ORAL | Status: DC

## 2010-12-06 MED ORDER — CITALOPRAM HYDROBROMIDE 20 MG PO TABS: 20.0000 mg | ORAL_TABLET | Freq: Every day | ORAL | Status: DC

## 2010-12-06 MED ORDER — ALPRAZOLAM 0.5 MG PO TABS: 0.5000 mg | ORAL_TABLET | Freq: Every evening | ORAL | Status: DC | PRN

## 2010-12-06 NOTE — Patient Instructions (Signed)

## 2010-12-06 NOTE — Progress Notes (Signed)
  Subjective:    Patient here for follow-up of elevated blood pressure.  He is not exercising and is not adherent to a low-salt diet.  Blood pressure not checked at home. Cardiac symptoms: none. Patient denies: chest pain, claudication, dyspnea, exertional chest pressure/discomfort, fatigue, irregular heart beat, lower extremity edema, near-syncope, orthopnea, palpitations, paroxysmal nocturnal dyspnea, syncope and tachypnea. Cardiovascular risk factors: dyslipidemia, hypertension, male gender, obesity (BMI >= 30 kg/m2) and sedentary lifestyle. Use of agents associated with hypertension: none. History of target organ damage: none.  The following portions of the patient's history were reviewed and updated as appropriate: allergies, current medications, past family history, past medical history, past social history, past surgical history and problem list.  Review of Systems Pertinent items are noted in HPI.     Objective:    BP 122/62  Pulse 79  Temp(Src) 98.6 F (37 C) (Oral)  Wt 241 lb (109.317 kg)  SpO2 96% General appearance: alert, cooperative, appears stated age, no distress and morbidly obese Lungs: clear to auscultation bilaterally Heart: regular rate and rhythm, S1, S2 normal, no murmur, click, rub or gallop Extremities: extremities normal, atraumatic, no cyanosis or edema    Assessment:    Hypertension, normal blood pressure . Evidence of target organ damage: none.    Plan:    Medication: no change. Dietary sodium restriction. Regular aerobic exercise. Follow up: 6 months and as needed.

## 2010-12-19 ENCOUNTER — Other Ambulatory Visit: Payer: Self-pay | Admitting: Family Medicine

## 2010-12-19 DIAGNOSIS — E785 Hyperlipidemia, unspecified: Secondary | ICD-10-CM

## 2010-12-19 DIAGNOSIS — I10 Essential (primary) hypertension: Secondary | ICD-10-CM

## 2010-12-20 ENCOUNTER — Other Ambulatory Visit (INDEPENDENT_AMBULATORY_CARE_PROVIDER_SITE_OTHER): Payer: BC Managed Care – PPO

## 2010-12-20 DIAGNOSIS — E785 Hyperlipidemia, unspecified: Secondary | ICD-10-CM

## 2010-12-20 DIAGNOSIS — I10 Essential (primary) hypertension: Secondary | ICD-10-CM

## 2010-12-20 LAB — HEPATIC FUNCTION PANEL
Albumin: 4.3 g/dL (ref 3.5–5.2)
Alkaline Phosphatase: 79 U/L (ref 39–117)
Bilirubin, Direct: 0.1 mg/dL (ref 0.0–0.3)
Total Protein: 7.1 g/dL (ref 6.0–8.3)

## 2010-12-20 LAB — LIPID PANEL
Cholesterol: 105 mg/dL (ref 0–200)
HDL: 32.2 mg/dL — ABNORMAL LOW (ref >39.00)
Triglycerides: 97 mg/dL (ref 0.0–149.0)

## 2010-12-20 LAB — BASIC METABOLIC PANEL
BUN: 12 mg/dL (ref 6–23)
Chloride: 108 mEq/L (ref 96–112)
Glucose, Bld: 105 mg/dL — ABNORMAL HIGH (ref 70–99)
Sodium: 143 mEq/L (ref 135–145)

## 2010-12-20 NOTE — Progress Notes (Signed)
Labs only

## 2011-01-14 ENCOUNTER — Other Ambulatory Visit: Payer: Self-pay | Admitting: Family Medicine

## 2011-01-14 DIAGNOSIS — F419 Anxiety disorder, unspecified: Secondary | ICD-10-CM

## 2011-01-14 NOTE — Telephone Encounter (Signed)
Last seen and filled 12/06/10 # 30 with 1 refill.... Please advise     KP 

## 2011-04-01 ENCOUNTER — Other Ambulatory Visit: Payer: Self-pay | Admitting: Family Medicine

## 2011-04-01 NOTE — Telephone Encounter (Signed)
Last seen and filled 12/06/10 # 30 with 1 refill.... Please advise     KP

## 2011-04-02 MED ORDER — ALPRAZOLAM 0.5 MG PO TABS: 0.5000 mg | ORAL_TABLET | Freq: Every evening | ORAL | Status: DC | PRN

## 2011-05-10 ENCOUNTER — Other Ambulatory Visit: Payer: Self-pay | Admitting: Family Medicine

## 2011-05-12 ENCOUNTER — Other Ambulatory Visit: Payer: Self-pay | Admitting: Family Medicine

## 2011-05-13 NOTE — Telephone Encounter (Signed)
Last seen 12/06/10 and filled 04/02/11 # 30. Please advise    KP

## 2011-05-13 NOTE — Telephone Encounter (Signed)
Faxed.   KP 

## 2011-06-12 ENCOUNTER — Other Ambulatory Visit: Payer: Self-pay | Admitting: Family Medicine

## 2011-06-12 NOTE — Telephone Encounter (Signed)
Last seen 12/06/10 and filled 05/12/11 # 30. Please advise     KP 

## 2011-06-19 ENCOUNTER — Other Ambulatory Visit: Payer: Self-pay | Admitting: Family Medicine

## 2011-06-19 NOTE — Telephone Encounter (Signed)
Last seen 12/06/10 and filled 05/12/11 # 30. Please advise     KP

## 2011-07-21 ENCOUNTER — Other Ambulatory Visit: Payer: Self-pay | Admitting: Family Medicine

## 2011-07-21 NOTE — Telephone Encounter (Signed)
Last seen /13/12 and filled 06/19/11  #30. Please advise   KP

## 2011-07-23 MED ORDER — CITALOPRAM HYDROBROMIDE 20 MG PO TABS: ORAL_TABLET | ORAL | Status: DC

## 2011-07-23 NOTE — Telephone Encounter (Signed)
Addended by: Candie Echevaria L on: 07/23/2011 05:16 PM   Modules accepted: Orders

## 2011-07-23 NOTE — Telephone Encounter (Signed)
Rx sent for celexa

## 2011-07-30 ENCOUNTER — Encounter: Payer: Self-pay | Admitting: Family Medicine

## 2011-07-30 ENCOUNTER — Ambulatory Visit (INDEPENDENT_AMBULATORY_CARE_PROVIDER_SITE_OTHER): Payer: BC Managed Care – PPO | Admitting: Family Medicine

## 2011-07-30 VITALS — BP 114/72 | HR 68 | Temp 98.5°F | Wt 246.6 lb

## 2011-07-30 DIAGNOSIS — G43909 Migraine, unspecified, not intractable, without status migrainosus: Secondary | ICD-10-CM

## 2011-07-30 DIAGNOSIS — I10 Essential (primary) hypertension: Secondary | ICD-10-CM

## 2011-07-30 DIAGNOSIS — E785 Hyperlipidemia, unspecified: Secondary | ICD-10-CM

## 2011-07-30 MED ORDER — HYDROCODONE-ACETAMINOPHEN 5-500 MG PO TABS: 1.0000 | ORAL_TABLET | Freq: Four times a day (QID) | ORAL | Status: DC | PRN

## 2011-07-30 NOTE — Patient Instructions (Signed)

## 2011-07-30 NOTE — Progress Notes (Signed)
  Subjective:    Patient here for follow-up of elevated blood pressure.  He is exercising and is adherent to a low-salt diet.  Blood pressure not checked at home. Cardiac symptoms: none. Patient denies: chest pain, chest pressure/discomfort, claudication, dyspnea, exertional chest pressure/discomfort, fatigue, irregular heart beat, lower extremity edema, near-syncope, orthopnea, palpitations, paroxysmal nocturnal dyspnea, syncope and tachypnea. Cardiovascular risk factors: advanced age (older than 58 for men, 31 for women), dyslipidemia, hypertension, male gender and obesity (BMI >= 30 kg/m2). Use of agents associated with hypertension: none. History of target organ damage: none.    Pt also needs labs done for cholesterol---he is not fasting today.  The following portions of the patient's history were reviewed and updated as appropriate: allergies, current medications, past family history, past medical history, past social history, past surgical history and problem list.  Review of Systems Pertinent items are noted in HPI.     Objective:    BP 114/72  Pulse 68  Temp(Src) 98.5 F (36.9 C) (Oral)  Wt 246 lb 9.6 oz (111.857 kg)  SpO2 96% General appearance: alert, cooperative, appears stated age and no distress Nose: Nares normal. Septum midline. Mucosa normal. No drainage or sinus tenderness. Throat: lips, mucosa, and tongue normal; teeth and gums normal Neck: no adenopathy, no carotid bruit, no JVD, supple, symmetrical, trachea midline and thyroid not enlarged, symmetric, no tenderness/mass/nodules Lungs: clear to auscultation bilaterally Heart: S1, S2 normal Extremities: extremities normal, atraumatic, no cyanosis or edema    Assessment:    Hypertension, normal blood pressure . Evidence of target organ damage: none.   hyperlipidemia--con't meds, check labs Plan:    Medication: no change. Dietary sodium restriction. Regular aerobic exercise. Follow up: 6 months and as needed.

## 2011-08-05 ENCOUNTER — Telehealth: Payer: Self-pay | Admitting: Family Medicine

## 2011-08-11 ENCOUNTER — Other Ambulatory Visit (INDEPENDENT_AMBULATORY_CARE_PROVIDER_SITE_OTHER): Payer: BC Managed Care – PPO

## 2011-08-11 DIAGNOSIS — E785 Hyperlipidemia, unspecified: Secondary | ICD-10-CM

## 2011-08-11 DIAGNOSIS — I10 Essential (primary) hypertension: Secondary | ICD-10-CM

## 2011-08-11 LAB — BASIC METABOLIC PANEL
Calcium: 9.1 mg/dL (ref 8.4–10.5)
Creatinine, Ser: 1 mg/dL (ref 0.4–1.5)
GFR: 79.51 mL/min (ref >60.00)
Glucose, Bld: 105 mg/dL — ABNORMAL HIGH (ref 70–99)
Sodium: 140 mEq/L (ref 135–145)

## 2011-08-11 LAB — HEPATIC FUNCTION PANEL
ALT: 28 U/L (ref 0–53)
AST: 20 U/L (ref 0–37)
Albumin: 4.1 g/dL (ref 3.5–5.2)
Alkaline Phosphatase: 76 U/L (ref 39–117)

## 2011-08-11 LAB — LIPID PANEL
Cholesterol: 94 mg/dL (ref 0–200)
HDL: 31 mg/dL — ABNORMAL LOW (ref >39.00)
Triglycerides: 112 mg/dL (ref 0.0–149.0)

## 2011-08-15 ENCOUNTER — Other Ambulatory Visit: Payer: Self-pay | Admitting: Family Medicine

## 2011-08-15 MED ORDER — SIMVASTATIN 10 MG PO TABS: 10.0000 mg | ORAL_TABLET | Freq: Every day | ORAL | Status: DC

## 2011-08-15 NOTE — Telephone Encounter (Signed)
Refill for simvastatin 10MG  tab #30  Take 1-tablet at bedtime  Last written 12.18.12

## 2011-08-19 ENCOUNTER — Other Ambulatory Visit: Payer: Self-pay | Admitting: Family Medicine

## 2011-08-19 NOTE — Telephone Encounter (Signed)
Refill for  Micardis 40MG  Tab #30 Take 1-tablet each day  Last written 1.17.13

## 2011-08-20 ENCOUNTER — Telehealth: Payer: Self-pay | Admitting: Family Medicine

## 2011-08-20 MED ORDER — TELMISARTAN 40 MG PO TABS: 40.0000 mg | ORAL_TABLET | Freq: Every day | ORAL | Status: DC

## 2011-08-20 NOTE — Telephone Encounter (Signed)
Last seen 07/30/11 and filled 05/12/11 # 30 please advise     KP

## 2011-08-20 NOTE — Telephone Encounter (Signed)
The medications have already been faxed today.     KP

## 2011-08-20 NOTE — Telephone Encounter (Signed)
Refills for  Alprazolam 0.5mg  tablets Qty 30 Take 1-tablet by mouth every night at bedtime as needed Last filled 2.25.13  Citaloprama 20MG  Tablets Qty 30 Take 1-tablet by mouth every day  Last filled 2.27.13

## 2011-10-03 ENCOUNTER — Encounter: Payer: Self-pay | Admitting: Family Medicine

## 2011-10-03 ENCOUNTER — Ambulatory Visit (INDEPENDENT_AMBULATORY_CARE_PROVIDER_SITE_OTHER): Payer: BC Managed Care – PPO | Admitting: Family Medicine

## 2011-10-03 VITALS — BP 130/76 | HR 76 | Temp 98.3°F | Ht 67.0 in | Wt 238.4 lb

## 2011-10-03 DIAGNOSIS — J019 Acute sinusitis, unspecified: Secondary | ICD-10-CM

## 2011-10-03 MED ORDER — SULFAMETHOXAZOLE-TRIMETHOPRIM 800-160 MG PO TABS: 1.0000 | ORAL_TABLET | Freq: Two times a day (BID) | ORAL | Status: AC

## 2011-10-03 MED ORDER — GUAIFENESIN-CODEINE 100-10 MG/5ML PO SYRP: 10.0000 mL | ORAL_SOLUTION | Freq: Three times a day (TID) | ORAL | Status: AC | PRN

## 2011-10-03 NOTE — Patient Instructions (Signed)
This is a sinus infection Start the Bactrim twice daily (w/ food) Drink plenty of fluids Take the mucinex as needed to thin your congestion Use the cough syrup- it may make you drowsy Call with any questions or concerns Hang in there! Congrats on retirement!!!

## 2011-10-03 NOTE — Progress Notes (Signed)
  Subjective:    Patient ID: Taylor Harvey, male    DOB: 1950-08-11, 61 y.o.   MRN: [id number]  HPI ? Sinusitis- sxs started 1 week ago.  + HA, facial pain, nasal congestion, bilateral ear pain, cough- productive of green sputum.  Low grade temps, + chills.  No known sick contacts.  + tooth pain.   Review of Systems For ROS see HPI     Objective:   Physical Exam  Vitals reviewed. Constitutional: He appears well-developed and well-nourished. No distress.  HENT:  Head: Normocephalic and atraumatic.  Right Ear: Tympanic membrane normal.  Left Ear: Tympanic membrane normal.  Nose: Mucosal edema and rhinorrhea present. Right sinus exhibits maxillary sinus tenderness and frontal sinus tenderness. Left sinus exhibits maxillary sinus tenderness and frontal sinus tenderness.  Mouth/Throat: Mucous membranes are normal. Oropharyngeal exudate and posterior oropharyngeal erythema present. No posterior oropharyngeal edema.       + PND  Eyes: Conjunctivae and EOM are normal. Pupils are equal, round, and reactive to light.  Neck: Normal range of motion. Neck supple.  Cardiovascular: Normal rate, regular rhythm and normal heart sounds.   Pulmonary/Chest: Effort normal and breath sounds normal. No respiratory distress. He has no wheezes.       + hacking cough  Lymphadenopathy:    He has no cervical adenopathy.  Skin: Skin is warm and dry.          Assessment & Plan:

## 2011-10-04 ENCOUNTER — Other Ambulatory Visit: Payer: Self-pay | Admitting: Family Medicine

## 2011-10-05 NOTE — Assessment & Plan Note (Signed)
Pt's hx and PE consistent w/ infxn.  Start abx.  Reviewed supportive care and red flags that should prompt return.  Pt expressed understanding and is in agreement w/ plan.  

## 2011-10-06 NOTE — Telephone Encounter (Signed)
Last seen 07/30/11 and filled 08/19/11 # 30. Please advise      KP

## 2011-10-15 ENCOUNTER — Ambulatory Visit (INDEPENDENT_AMBULATORY_CARE_PROVIDER_SITE_OTHER): Payer: BC Managed Care – PPO | Admitting: Family Medicine

## 2011-10-15 ENCOUNTER — Telehealth: Payer: Self-pay | Admitting: Family Medicine

## 2011-10-15 ENCOUNTER — Encounter: Payer: Self-pay | Admitting: Family Medicine

## 2011-10-15 ENCOUNTER — Ambulatory Visit (HOSPITAL_BASED_OUTPATIENT_CLINIC_OR_DEPARTMENT_OTHER)
Admission: RE | Admit: 2011-10-15 | Discharge: 2011-10-15 | Disposition: A | Discharge status: Home / Self Care | Payer: BC Managed Care – PPO | Source: Ambulatory Visit | Attending: Family Medicine | Admitting: Family Medicine

## 2011-10-15 VITALS — BP 110/68 | HR 68 | Temp 98.5°F | Wt 239.4 lb

## 2011-10-15 DIAGNOSIS — R05 Cough: Secondary | ICD-10-CM

## 2011-10-15 DIAGNOSIS — R059 Cough, unspecified: Secondary | ICD-10-CM | POA: Insufficient documentation

## 2011-10-15 MED ORDER — LORATADINE 10 MG PO TABS: 10.0000 mg | ORAL_TABLET | Freq: Every day | ORAL | Status: DC

## 2011-10-15 MED ORDER — FLUTICASONE-SALMETEROL 100-50 MCG/DOSE IN AEPB: 1.0000 | INHALATION_SPRAY | Freq: Two times a day (BID) | RESPIRATORY_TRACT | Status: DC

## 2011-10-15 NOTE — Telephone Encounter (Signed)
No refill

## 2011-10-15 NOTE — Progress Notes (Signed)
  Subjective:     Taylor Harvey is a 61 y.o. male here for evaluation of a cough. Onset of symptoms was several weeks ago. Symptoms have been gradually improving since that time. The cough is dry and is aggravated by nothing. Associated symptoms include: none. Patient does not have a history of asthma. Patient does not have a history of environmental allergens. Patient has not traveled recently. Patient does not have a history of smoking. Patient has not had a previous chest x-ray. Patient has not had a PPD done.  The following portions of the patient's history were reviewed and updated as appropriate: allergies, current medications, past family history, past medical history, past social history, past surgical history and problem list.  Review of Systems Pertinent items are noted in HPI.    Objective:    Oxygen saturation 97% on room air BP 110/68  Pulse 68  Temp(Src) 98.5 F (36.9 C) (Oral)  Wt 239 lb 6.4 oz (108.591 kg)  SpO2 97% General appearance: alert, cooperative, appears stated age and no distress Nose: Nares normal. Septum midline. Mucosa normal. No drainage or sinus tenderness. Throat: lips, mucosa, and tongue normal; teeth and gums normal Neck: no adenopathy, no carotid bruit, no JVD, supple, symmetrical, trachea midline and thyroid not enlarged, symmetric, no tenderness/mass/nodules Lungs: clear to auscultation bilaterally Heart: S1, S2 normal    Assessment:    Cough    Plan:    Explained lack of efficacy of antibiotics in viral disease. B-agonist inhaler. Call if shortness of breath worsens, blood in sputum, change in character of cough, development of fever or chills, inability to maintain nutrition and hydration. Avoid exposure to tobacco smoke and fumes. Chest x-ray. Steroid inhaler as ordered. Trial of antihistamines. probably post infectious--see last ov

## 2011-10-15 NOTE — Telephone Encounter (Signed)
Lowne pt, please advise

## 2011-10-15 NOTE — Telephone Encounter (Signed)
Patient is being seen today. Please advise      KP

## 2011-10-15 NOTE — Telephone Encounter (Signed)
Refill: Sulfameth/Trimethoprim 800/160 tabs. Take 1 tablet by mouth twice daily. Qty 20. Last fill 10-03-11

## 2011-10-15 NOTE — Patient Instructions (Signed)

## 2011-10-21 ENCOUNTER — Telehealth: Payer: Self-pay | Admitting: Family Medicine

## 2011-10-21 NOTE — Telephone Encounter (Signed)
msg left to call the office     KP 

## 2011-10-21 NOTE — Telephone Encounter (Signed)
Discussed with patient and he is not sure why the pharmacy has sent the refill request. Spoke with Leanna at the pharmacy and she stqted she would remove the request.     KP

## 2011-10-21 NOTE — Telephone Encounter (Signed)
Refill: Sulfameth/Triimethoprim 800/160 tabs. Take 1 tablet by mouth twice daily. Qty 20. Last fill 10-03-11

## 2011-10-21 NOTE — Telephone Encounter (Signed)
Last seen 10/15/11 and filled 10/03/11. Please advise    KP

## 2011-10-21 NOTE — Telephone Encounter (Signed)
We don't normally refill bactrim-- why does he need it?

## 2011-11-14 ENCOUNTER — Other Ambulatory Visit: Payer: Self-pay | Admitting: Family Medicine

## 2011-11-14 NOTE — Telephone Encounter (Signed)
Last OV 10-15-11 last refill 10-04-11 #30 no refills for alprazolam

## 2011-12-19 ENCOUNTER — Other Ambulatory Visit: Payer: Self-pay | Admitting: Family Medicine

## 2011-12-19 NOTE — Telephone Encounter (Signed)
Last seen 10/15/11 and filled 11/14/11 # 30. Please advise      KP

## 2012-01-20 ENCOUNTER — Encounter: Payer: Self-pay | Admitting: Family Medicine

## 2012-01-20 ENCOUNTER — Ambulatory Visit (INDEPENDENT_AMBULATORY_CARE_PROVIDER_SITE_OTHER): Payer: BC Managed Care – PPO | Admitting: Family Medicine

## 2012-01-20 VITALS — BP 130/76 | HR 69 | Temp 98.4°F | Ht 67.0 in | Wt 246.6 lb

## 2012-01-20 DIAGNOSIS — I10 Essential (primary) hypertension: Secondary | ICD-10-CM

## 2012-01-20 DIAGNOSIS — Z0000 Encounter for general adult medical examination without abnormal findings: Secondary | ICD-10-CM

## 2012-01-20 DIAGNOSIS — N390 Urinary tract infection, site not specified: Secondary | ICD-10-CM

## 2012-01-20 DIAGNOSIS — E669 Obesity, unspecified: Secondary | ICD-10-CM | POA: Insufficient documentation

## 2012-01-20 DIAGNOSIS — F411 Generalized anxiety disorder: Secondary | ICD-10-CM

## 2012-01-20 DIAGNOSIS — F419 Anxiety disorder, unspecified: Secondary | ICD-10-CM

## 2012-01-20 DIAGNOSIS — R739 Hyperglycemia, unspecified: Secondary | ICD-10-CM

## 2012-01-20 DIAGNOSIS — E785 Hyperlipidemia, unspecified: Secondary | ICD-10-CM

## 2012-01-20 DIAGNOSIS — R7309 Other abnormal glucose: Secondary | ICD-10-CM

## 2012-01-20 LAB — PSA: PSA: 0.28 ng/mL (ref 0.10–4.00)

## 2012-01-20 LAB — BASIC METABOLIC PANEL
BUN: 13 mg/dL (ref 6–23)
Calcium: 9 mg/dL (ref 8.4–10.5)
GFR: 77.62 mL/min (ref >60.00)
Glucose, Bld: 95 mg/dL (ref 70–99)

## 2012-01-20 LAB — HEMOGLOBIN A1C: Hgb A1c MFr Bld: 6 % (ref 4.6–6.5)

## 2012-01-20 LAB — HEPATIC FUNCTION PANEL
AST: 26 U/L (ref 0–37)
Alkaline Phosphatase: 85 U/L (ref 39–117)
Bilirubin, Direct: 0.1 mg/dL (ref 0.0–0.3)
Total Bilirubin: 0.5 mg/dL (ref 0.3–1.2)

## 2012-01-20 LAB — CBC WITH DIFFERENTIAL/PLATELET
Basophils Absolute: 0 10*3/uL (ref 0.0–0.1)
Lymphocytes Relative: 30.4 % (ref 12.0–46.0)
Monocytes Relative: 9.2 % (ref 3.0–12.0)
Neutrophils Relative %: 57.3 % (ref 43.0–77.0)
Platelets: 158 10*3/uL (ref 150.0–400.0)
RDW: 13.2 % (ref 11.5–14.6)
WBC: 4.8 10*3/uL (ref 4.5–10.5)

## 2012-01-20 LAB — POCT URINALYSIS DIPSTICK
Protein, UA: NEGATIVE
Spec Grav, UA: >=1.03
Urobilinogen, UA: 0.2

## 2012-01-20 LAB — LIPID PANEL
Cholesterol: 95 mg/dL (ref 0–200)
LDL Cholesterol: 55 mg/dL (ref 0–99)
Total CHOL/HDL Ratio: 3
VLDL: 12.2 mg/dL (ref 0.0–40.0)

## 2012-01-20 MED ORDER — ALPRAZOLAM 0.5 MG PO TABS: 0.5000 mg | ORAL_TABLET | Freq: Three times a day (TID) | ORAL | Status: DC | PRN

## 2012-01-20 MED ORDER — SIMVASTATIN 10 MG PO TABS: 10.0000 mg | ORAL_TABLET | Freq: Every day | ORAL | Status: DC

## 2012-01-20 NOTE — Progress Notes (Signed)
Subjective:    Patient ID: Taylor Harvey, male    DOB: [birth year], 61 y.o.   MRN: [id number]  HPI Pt here for cpe and labs.  No complaints.   Review of Systems Review of Systems  Constitutional: Negative for activity change, appetite change and fatigue.  HENT: Negative for hearing loss, congestion, tinnitus and ear discharge.  dentist --no Eyes: Negative for visual disturbance (see optho q1y -- ).  Respiratory: Negative for cough, chest tightness and shortness of breath.   Cardiovascular: Negative for chest pain, palpitations and leg swelling.  Gastrointestinal: Negative for abdominal pain, diarrhea, constipation and abdominal distention.  Genitourinary: Negative for urgency, frequency, decreased urine volume and difficulty urinating.  Musculoskeletal: Negative for back pain, arthralgias and gait problem.  Skin: Negative for color change, pallor and rash.  Neurological: Negative for dizziness, light-headedness, numbness and headaches.  Hematological: Negative for adenopathy. Does not bruise/bleed easily.  Psychiatric/Behavioral: Negative for suicidal ideas, confusion, sleep disturbance, self-injury, dysphoric mood, decreased concentration and agitation.   Past Medical History  Diagnosis Date  . Hypertension   . Anxiety    History   Social History  . Marital Status: Married    Spouse Name: N/A    Number of Children: N/A  . Years of Education: N/A   Occupational History  . retired    Social History Main Topics  . Smoking status: Never Smoker   . Smokeless tobacco: Never Used  . Alcohol Use: No  . Drug Use: No  . Sexually Active: Yes -- Male partner(s)   Other Topics Concern  . Not on file   Social History Narrative   Exercise--walks a mile 5 days a week   Current Outpatient Prescriptions on File Prior to Visit  Medication Sig Dispense Refill  . aspirin 81 MG tablet Take 81 mg by mouth daily.        . citalopram (CELEXA) 20 MG tablet TAKE 1 TABLET BY MOUTH  EVERY DAY  30 tablet  5  . HYDROcodone-acetaminophen (VICODIN) 5-500 MG per tablet Take 1 tablet by mouth every 6 (six) hours as needed.  30 tablet  0  . telmisartan (MICARDIS) 40 MG tablet Take 1 tablet (40 mg total) by mouth daily.  30 tablet  5  . DISCONTD: ALPRAZolam (XANAX) 0.5 MG tablet TAKE 1 TABLET BY MOUTH EVERY NIGHT AT BEDTIME AS NEEDED  30 tablet  0  . DISCONTD: simvastatin (ZOCOR) 10 MG tablet TAKE 1 TABLET BY MOUTH EVERY NIGHT AT BEDTIME  30 tablet  1   Family History  Problem Relation Age of Onset  . Pancreatic cancer    . Diabetes Mother   . Pancreatitis Mother   . Heart disease Mother     MI  . Stroke Father   . Hypertension Father   . Heart disease Sister   . Diabetes Sister 62    dm  . Cancer Sister     renal  . Diabetes Sister   . Diabetes Sister    History reviewed. No pertinent past surgical history.    Objective:   Physical Exam BP 130/76  Pulse 69  Temp 98.4 F (36.9 C) (Oral)  Ht 5\' 7"  (1.702 m)  Wt 246 lb 9.6 oz (111.857 kg)  BMI 38.62 kg/m2  SpO2 96%  General Appearance:    Alert, cooperative, no distress, appears stated age  Head:    Normocephalic, without obvious abnormality, atraumatic  Eyes:    PERRL, conjunctiva/corneas clear, EOM's intact, fundi    benign,  both eyes       Ears:    Normal TM's and external ear canals, both ears  Nose:   Nares normal, septum midline, mucosa normal, no drainage   or sinus tenderness  Throat:   Lips, mucosa, and tongue normal; teeth and gums normal  Neck:   Supple, symmetrical, trachea midline, no adenopathy;       thyroid:  No enlargement/tenderness/nodules; no carotid   bruit or JVD  Back:     Symmetric, no curvature, ROM normal, no CVA tenderness  Lungs:     Clear to auscultation bilaterally, respirations unlabored  Chest wall:    No tenderness or deformity  Heart:    Regular rate and rhythm, S1 and S2 normal, no murmur, rub   or gallop  Abdomen:     Soft, non-tender, bowel sounds active all four  quadrants,    no masses, no organomegaly  Genitalia:    Normal male without lesion, discharge or tenderness  Rectal:    Normal tone, normal prostate, no masses or tenderness;   guaiac negative stool  Extremities:   Extremities normal, atraumatic, no cyanosis or edema  Pulses:   2+ and symmetric all extremities  Skin:   Skin color, texture, turgor normal, no rashes or lesions  Lymph nodes:   Cervical, supraclavicular, and axillary nodes normal  Neurologic:   CNII-XII intact. Normal strength, sensation and reflexes      throughout         Assessment & Plan:  cpe--- ghm utd Check labs See AVS

## 2012-01-20 NOTE — Assessment & Plan Note (Signed)
Refill meds

## 2012-01-20 NOTE — Assessment & Plan Note (Signed)
Stable con't meds 

## 2012-01-20 NOTE — Addendum Note (Signed)
Addended by: Silvio Pate D on: 01/20/2012 01:39 PM   Modules accepted: Orders

## 2012-01-20 NOTE — Assessment & Plan Note (Signed)
Check labs con't meds 

## 2012-01-20 NOTE — Patient Instructions (Signed)
Preventive Care for Adults, Male A healthy lifestyle and preventative care can promote health and wellness. Preventative health guidelines for men include the following key practices:  A routine yearly physical is a good way to check with your caregiver about your health and preventative screening. It is a chance to share any concerns and updates on your health, and to receive a thorough exam.   Visit your dentist for a routine exam and preventative care every 6 months. Brush your teeth twice a day and floss once a day. Good oral hygiene prevents tooth decay and gum disease.   The frequency of eye exams is based on your age, health, family medical history, use of contact lenses, and other factors. Follow your caregiver's recommendations for frequency of eye exams.   Eat a healthy diet. Foods like vegetables, fruits, whole grains, low-fat dairy products, and lean protein foods contain the nutrients you need without too many calories. Decrease your intake of foods high in solid fats, added sugars, and salt. Eat the right amount of calories for you.Get information about a proper diet from your caregiver, if necessary.   Regular physical exercise is one of the most important things you can do for your health. Most adults should get at least 150 minutes of moderate-intensity exercise (any activity that increases your heart rate and causes you to sweat) each week. In addition, most adults need muscle-strengthening exercises on 2 or more days a week.   Maintain a healthy weight. The body mass index (BMI) is a screening tool to identify possible weight problems. It provides an estimate of body fat based on height and weight. Your caregiver can help determine your BMI, and can help you achieve or maintain a healthy weight.For adults 20 years and older:   A BMI below 18.5 is considered underweight.   A BMI of 18.5 to 24.9 is normal.   A BMI of 25 to 29.9 is considered overweight.   A BMI of 30 and above  is considered obese.   Maintain normal blood lipids and cholesterol levels by exercising and minimizing your intake of saturated fat. Eat a balanced diet with plenty of fruit and vegetables. Blood tests for lipids and cholesterol should begin at age 20 and be repeated every 5 years. If your lipid or cholesterol levels are high, you are over 50, or you are a high risk for heart disease, you may need your cholesterol levels checked more frequently.Ongoing high lipid and cholesterol levels should be treated with medicines if diet and exercise are not effective.   If you smoke, find out from your caregiver how to quit. If you do not use tobacco, do not start.   If you choose to drink alcohol, do not exceed 2 drinks per day. One drink is considered to be 12 ounces (355 mL) of beer, 5 ounces (148 mL) of wine, or 1.5 ounces (44 mL) of liquor.   Avoid use of street drugs. Do not share needles with anyone. Ask for help if you need support or instructions about stopping the use of drugs.   High blood pressure causes heart disease and increases the risk of stroke. Your blood pressure should be checked at least every 1 to 2 years. Ongoing high blood pressure should be treated with medicines, if weight loss and exercise are not effective.   If you are 45 to 62 years old, ask your caregiver if you should take aspirin to prevent heart disease.   Diabetes screening involves taking a blood   sample to check your fasting blood sugar level. This should be done once every 3 years, after age 45, if you are within normal weight and without risk factors for diabetes. Testing should be considered at a younger age or be carried out more frequently if you are overweight and have at least 1 risk factor for diabetes.   Colorectal cancer can be detected and often prevented. Most routine colorectal cancer screening begins at the age of 50 and continues through age 75. However, your caregiver may recommend screening at an earlier  age if you have risk factors for colon cancer. On a yearly basis, your caregiver may provide home test kits to check for hidden blood in the stool. Use of a small camera at the end of a tube, to directly examine the colon (sigmoidoscopy or colonoscopy), can detect the earliest forms of colorectal cancer. Talk to your caregiver about this at age 50, when routine screening begins. Direct examination of the colon should be repeated every 5 to 10 years through age 75, unless early forms of pre-cancerous polyps or small growths are found.   Hepatitis C blood testing is recommended for all people born from 1945 through 1965 and any individual with known risks for hepatitis C.   Practice safe sex. Use condoms and avoid high-risk sexual practices to reduce the spread of sexually transmitted infections (STIs). STIs include gonorrhea, chlamydia, syphilis, trichomonas, herpes, HPV, and human immunodeficiency virus (HIV). Herpes, HIV, and HPV are viral illnesses that have no cure. They can result in disability, cancer, and death.   A one-time screening for abdominal aortic aneurysm (AAA) and surgical repair of large AAAs by sound wave imaging (ultrasonography) is recommended for ages 65 to 75 years who are current or former smokers.   Healthy men should no longer receive prostate-specific antigen (PSA) blood tests as part of routine cancer screening. Consult with your caregiver about prostate cancer screening.   Testicular cancer screening is not recommended for adult males who have no symptoms. Screening includes self-exam, caregiver exam, and other screening tests. Consult with your caregiver about any symptoms you have or any concerns you have about testicular cancer.   Use sunscreen with skin protection factor (SPF) of 30 or more. Apply sunscreen liberally and repeatedly throughout the day. You should seek shade when your shadow is shorter than you. Protect yourself by wearing long sleeves, pants, a  wide-brimmed hat, and sunglasses year round, whenever you are outdoors.   Once a month, do a whole body skin exam, using a mirror to look at the skin on your back. Notify your caregiver of new moles, moles that have irregular borders, moles that are larger than a pencil eraser, or moles that have changed in shape or color.   Stay current with required immunizations.   Influenza. You need a dose every fall (or winter). The composition of the flu vaccine changes each year, so being vaccinated once is not enough.   Pneumococcal polysaccharide. You need 1 to 2 doses if you smoke cigarettes or if you have certain chronic medical conditions. You need 1 dose at age 65 (or older) if you have never been vaccinated.   Tetanus, diphtheria, pertussis (Tdap, Td). Get 1 dose of Tdap vaccine if you are younger than age 65 years, are over 65 and have contact with an infant, are a healthcare worker, or simply want to be protected from whooping cough. After that, you need a Td booster dose every 10 years. Consult your caregiver if   you have not had at least 3 tetanus and diphtheria-containing shots sometime in your life or have a deep or dirty wound.   HPV. This vaccine is recommended for males 13 through 62 years of age. This vaccine may be given to men 22 through 62 years of age who have not completed the 3 dose series. It is recommended for men through age 26 who have sex with men or whose immune system is weakened because of HIV infection, other illness, or medications. The vaccine is given in 3 doses over 6 months.   Measles, mumps, rubella (MMR). You need at least 1 dose of MMR if you were born in 1957 or later. You may also need a 2nd dose.   Meningococcal. If you are age 19 to 21 years and a first-year college student living in a residence hall, or have one of several medical conditions, you need to get vaccinated against meningococcal disease. You may also need additional booster doses.   Zoster (shingles).  If you are age 60 years or older, you should get this vaccine.   Varicella (chickenpox). If you have never had chickenpox or you were vaccinated but received only 1 dose, talk to your caregiver to find out if you need this vaccine.   Hepatitis A. You need this vaccine if you have a specific risk factor for hepatitis A virus infection, or you simply wish to be protected from this disease. The vaccine is usually given as 2 doses, 6 to 18 months apart.   Hepatitis B. You need this vaccine if you have a specific risk factor for hepatitis B virus infection or you simply wish to be protected from this disease. The vaccine is given in 3 doses, usually over 6 months.  Preventative Service / Frequency Ages 19 to 39  Blood pressure check.** / Every 1 to 2 years.   Lipid and cholesterol check.** / Every 5 years beginning at age 20.   Hepatitis C blood test.** / For any individual with known risks for hepatitis C.   Skin self-exam. / Monthly.   Influenza immunization.** / Every year.   Pneumococcal polysaccharide immunization.** / 1 to 2 doses if you smoke cigarettes or if you have certain chronic medical conditions.   Tetanus, diphtheria, pertussis (Tdap,Td) immunization. / A one-time dose of Tdap vaccine. After that, you need a Td booster dose every 10 years.   HPV immunization. / 3 doses over 6 months, if 26 and younger.   Measles, mumps, rubella (MMR) immunization. / You need at least 1 dose of MMR if you were born in 1957 or later. You may also need a 2nd dose.   Meningococcal immunization. / 1 dose if you are age 19 to 21 years and a first-year college student living in a residence hall, or have one of several medical conditions, you need to get vaccinated against meningococcal disease. You may also need additional booster doses.   Varicella immunization.** / Consult your caregiver.   Hepatitis A immunization.** / Consult your caregiver. 2 doses, 6 to 18 months apart.   Hepatitis B  immunization.** / Consult your caregiver. 3 doses usually over 6 months.  Ages 40 to 64  Blood pressure check.** / Every 1 to 2 years.   Lipid and cholesterol check.** / Every 5 years beginning at age 20.   Fecal occult blood test (FOBT) of stool. / Every year beginning at age 50 and continuing until age 75. You may not have to do this test if   you get colonoscopy every 10 years.   Flexible sigmoidoscopy** or colonoscopy.** / Every 5 years for a flexible sigmoidoscopy or every 10 years for a colonoscopy beginning at age 50 and continuing until age 75.   Hepatitis C blood test.** / For all people born from 1945 through 1965 and any individual with known risks for hepatitis C.   Skin self-exam. / Monthly.   Influenza immunization.** / Every year.   Pneumococcal polysaccharide immunization.** / 1 to 2 doses if you smoke cigarettes or if you have certain chronic medical conditions.   Tetanus, diphtheria, pertussis (Tdap/Td) immunization.** / A one-time dose of Tdap vaccine. After that, you need a Td booster dose every 10 years.   Measles, mumps, rubella (MMR) immunization. / You need at least 1 dose of MMR if you were born in 1957 or later. You may also need a 2nd dose.   Varicella immunization.**/ Consult your caregiver.   Meningococcal immunization.** / Consult your caregiver.   Hepatitis A immunization.** / Consult your caregiver. 2 doses, 6 to 18 months apart.   Hepatitis B immunization.** / Consult your caregiver. 3 doses, usually over 6 months.  Ages 65 and over  Blood pressure check.** / Every 1 to 2 years.   Lipid and cholesterol check.**/ Every 5 years beginning at age 20.   Fecal occult blood test (FOBT) of stool. / Every year beginning at age 50 and continuing until age 75. You may not have to do this test if you get colonoscopy every 10 years.   Flexible sigmoidoscopy** or colonoscopy.** / Every 5 years for a flexible sigmoidoscopy or every 10 years for a colonoscopy  beginning at age 50 and continuing until age 75.   Hepatitis C blood test.** / For all people born from 1945 through 1965 and any individual with known risks for hepatitis C.   Abdominal aortic aneurysm (AAA) screening.** / A one-time screening for ages 65 to 75 years who are current or former smokers.   Skin self-exam. / Monthly.   Influenza immunization.** / Every year.   Pneumococcal polysaccharide immunization.** / 1 dose at age 65 (or older) if you have never been vaccinated.   Tetanus, diphtheria, pertussis (Tdap, Td) immunization. / A one-time dose of Tdap vaccine if you are over 65 and have contact with an infant, are a healthcare worker, or simply want to be protected from whooping cough. After that, you need a Td booster dose every 10 years.   Varicella immunization. ** / Consult your caregiver.   Meningococcal immunization.** / Consult your caregiver.   Hepatitis A immunization. ** / Consult your caregiver. 2 doses, 6 to 18 months apart.   Hepatitis B immunization.** / Check with your caregiver. 3 doses, usually over 6 months.  **Family history and personal history of risk and conditions may change your caregiver's recommendations. Document Released: 07/08/2001 Document Revised: 05/01/2011 Document Reviewed: 10/07/2010 Exit[employer] Patient Information 2012 [employer]. 

## 2012-01-21 LAB — VARICELLA ZOSTER ANTIBODY, IGG: Varicella IgG: 4.64 {ISR} — ABNORMAL HIGH

## 2012-01-23 ENCOUNTER — Telehealth: Payer: Self-pay

## 2012-01-23 ENCOUNTER — Telehealth: Payer: Self-pay | Admitting: *Deleted

## 2012-01-23 LAB — URINE CULTURE

## 2012-01-23 MED ORDER — CIPROFLOXACIN HCL 500 MG PO TABS: 500.0000 mg | ORAL_TABLET | Freq: Two times a day (BID) | ORAL | Status: AC

## 2012-01-23 NOTE — Telephone Encounter (Signed)
Received incoming call from [facility] with [facility] on [address] to verify if ok for pt to have the Cipro per notes also on Celexa and interaction noted if they take together possible QT wave enlongment, gave verbal order per MD Lowne to dispense to pt as directed, Loraine Leriche will fill rx for pt

## 2012-01-23 NOTE — Telephone Encounter (Signed)
Message copied by Arnette Norris on Fri Jan 23, 2012 10:28 AM ------      Message from: Lelon Perla      Created: Fri Jan 23, 2012  8:42 AM       + UTI--  cipro 500 mg bid for 5 days

## 2012-01-23 NOTE — Telephone Encounter (Signed)
Discussed with patient and Rx was sent    KP

## 2012-02-17 NOTE — Telephone Encounter (Signed)
Opened in error

## 2012-03-01 ENCOUNTER — Other Ambulatory Visit: Payer: Self-pay | Admitting: Family Medicine

## 2012-03-06 ENCOUNTER — Other Ambulatory Visit: Payer: Self-pay | Admitting: Family Medicine

## 2012-03-09 NOTE — Telephone Encounter (Signed)
Rx sent.    MW 

## 2012-03-29 ENCOUNTER — Encounter: Payer: Self-pay | Admitting: Family Medicine

## 2012-03-29 ENCOUNTER — Ambulatory Visit (INDEPENDENT_AMBULATORY_CARE_PROVIDER_SITE_OTHER): Payer: BC Managed Care – PPO | Admitting: Family Medicine

## 2012-03-29 VITALS — BP 122/70 | HR 75 | Temp 98.4°F | Wt 247.8 lb

## 2012-03-29 DIAGNOSIS — K429 Umbilical hernia without obstruction or gangrene: Secondary | ICD-10-CM

## 2012-03-29 DIAGNOSIS — Z23 Encounter for immunization: Secondary | ICD-10-CM

## 2012-03-29 NOTE — Patient Instructions (Addendum)
Hernia A hernia occurs when an internal organ pushes out through a weak spot in the abdominal wall. Hernias most commonly occur in the groin and around the navel. Hernias often can be pushed back into place (reduced). Most hernias tend to get worse over time. Some abdominal hernias can get stuck in the opening (irreducible or incarcerated hernia) and cannot be reduced. An irreducible abdominal hernia which is tightly squeezed into the opening is at risk for impaired blood supply (strangulated hernia). A strangulated hernia is a medical emergency. Because of the risk for an irreducible or strangulated hernia, surgery may be recommended to repair a hernia. CAUSES   Heavy lifting.  Prolonged coughing.  Straining to have a bowel movement.  A cut (incision) made during an abdominal surgery. HOME CARE INSTRUCTIONS   Bed rest is not required. You may continue your normal activities.  Avoid lifting more than 10 pounds (4.5 kg) or straining.  Cough gently. If you are a smoker it is best to stop. Even the best hernia repair can break down with the continual strain of coughing. Even if you do not have your hernia repaired, a cough will continue to aggravate the problem.  Do not wear anything tight over your hernia. Do not try to keep it in with an outside bandage or truss. These can damage abdominal contents if they are trapped within the hernia sac.  Eat a normal diet.  Avoid constipation. Straining over long periods of time will increase hernia size and encourage breakdown of repairs. If you cannot do this with diet alone, stool softeners may be used. SEEK IMMEDIATE MEDICAL CARE IF:   You have a fever.  You develop increasing abdominal pain.  You feel nauseous or vomit.  Your hernia is stuck outside the abdomen, looks discolored, feels hard, or is tender.  You have any changes in your bowel habits or in the hernia that are unusual for you.  You have increased pain or swelling around the  hernia.  You cannot push the hernia back in place by applying gentle pressure while lying down. MAKE SURE YOU:   Understand these instructions.  Will watch your condition.  Will get help right away if you are not doing well or get worse. Document Released: 05/12/2005 Document Revised: 08/04/2011 Document Reviewed: 12/30/2007 ExitCare Patient Information 2013 [employer].  

## 2012-03-29 NOTE — Progress Notes (Signed)
  Subjective:    Patient ID: Taylor Harvey, male    DOB: Apr 22, 1951, 61 y.o.   MRN: [id number]  HPI Pt here for flu and shingles vaccine.  He also c/o umbilical hernia that is not hurting him but is getting bigger.  He would like to see someone about this.     Review of Systems As above    Objective:   Physical Exam  Constitutional: He is oriented to person, place, and time. He appears well-developed and well-nourished.  Pulmonary/Chest: Effort normal and breath sounds normal.  Abdominal: Soft. Bowel sounds are normal.       + umbilical hernia--reducible  Neurological: He is alert and oriented to person, place, and time.  Psychiatric: He has a normal mood and affect. His behavior is normal. Judgment and thought content normal.          Assessment & Plan:  Umbilical hernia--  Refer to surgeon for evaluation

## 2012-04-09 ENCOUNTER — Ambulatory Visit (INDEPENDENT_AMBULATORY_CARE_PROVIDER_SITE_OTHER): Payer: BC Managed Care – PPO | Admitting: General Surgery

## 2012-04-16 ENCOUNTER — Ambulatory Visit (INDEPENDENT_AMBULATORY_CARE_PROVIDER_SITE_OTHER): Payer: BC Managed Care – PPO | Admitting: General Surgery

## 2012-04-16 ENCOUNTER — Encounter (INDEPENDENT_AMBULATORY_CARE_PROVIDER_SITE_OTHER): Payer: Self-pay | Admitting: General Surgery

## 2012-04-16 VITALS — BP 120/78 | HR 71 | Temp 97.3°F | Ht 67.0 in | Wt 249.8 lb

## 2012-04-16 DIAGNOSIS — K429 Umbilical hernia without obstruction or gangrene: Secondary | ICD-10-CM

## 2012-04-18 NOTE — Progress Notes (Signed)
Patient ID: Taylor G Grisanti Jr., male   DOB: 11/29/1950, 61 y.o.   MRN: [id number]  Chief Complaint  Patient presents with  . Pre-op Exam    eval umb hernia    HPI Doctor G Passarella Jr. is a 61 y.o. male.  Referred by Dr. Lowne HPI 61 yom with several year history of enlarging umbilical mass that is present most of the time. This will reduce but has gotten bigger.  He has no change in bowel movements, no n/v.  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  He would like evaluated to discuss possible repair.  Past Medical History  Diagnosis Date  . Hypertension   . Anxiety     Past Surgical History  Procedure Date  . Trigger finger surgery 2006    Family History  Problem Relation Age of Onset  . Pancreatic cancer    . Diabetes Mother   . Pancreatitis Mother   . Heart disease Mother     MI  . Stroke Father   . Hypertension Father   . Heart disease Father   . Heart disease Sister   . Diabetes Sister 62    dm  . Cancer Sister     renal  . Diabetes Sister   . Diabetes Sister     Social History History  Substance Use Topics  . Smoking status: Never Smoker   . Smokeless tobacco: Never Used  . Alcohol Use: No    No Known Allergies  Current Outpatient Prescriptions  Medication Sig Dispense Refill  . ALPRAZolam (XANAX) 0.5 MG tablet Take 1 tablet (0.5 mg total) by mouth 3 (three) times daily as needed for sleep.  30 tablet  1  . aspirin 81 MG tablet Take 81 mg by mouth daily.        . citalopram (CELEXA) 20 MG tablet TAKE 1 TABLET BY MOUTH EVERY DAY  30 tablet  5  . HYDROcodone-acetaminophen (VICODIN) 5-500 MG per tablet Take 1 tablet by mouth every 6 (six) hours as needed.  30 tablet  0  . loratadine (CLARITIN) 10 MG tablet Take 10 mg by mouth daily.      . MICARDIS 40 MG tablet TAKE 1 TABLET BY MOUTH EVERY DAY  30 tablet  5  . OVER THE COUNTER MEDICATION Somnapure--natural sleep aid 2 by mouth at bedtime      . simvastatin (ZOCOR) 10 MG tablet Take 1 tablet  (10 mg total) by mouth at bedtime.  30 tablet  2    Review of Systems Review of Systems  Constitutional: Negative for fever, chills and unexpected weight change.  HENT: Negative for hearing loss, congestion, sore throat, trouble swallowing and voice change.   Eyes: Negative for visual disturbance.  Respiratory: Negative for cough and wheezing.   Cardiovascular: Negative for chest pain, palpitations and leg swelling.  Gastrointestinal: Negative for nausea, vomiting, abdominal pain, diarrhea, constipation, blood in stool, abdominal distention, anal bleeding and rectal pain.  Genitourinary: Negative for hematuria and difficulty urinating.  Musculoskeletal: Negative for arthralgias.  Skin: Negative for rash and wound.  Neurological: Negative for seizures, syncope, weakness and headaches.  Hematological: Negative for adenopathy. Does not bruise/bleed easily.  Psychiatric/Behavioral: Negative for confusion.    Blood pressure 120/78, pulse 71, temperature 97.3 F (36.3 C), temperature source Temporal, height 5' 7" (1.702 m), weight 249 lb 12.8 oz (113.309 kg), SpO2 96.00%.  Physical Exam Physical Exam  Vitals reviewed. Constitutional: He appears well-developed and well-nourished.  Cardiovascular:   Normal rate, regular rhythm and normal heart sounds.   Pulmonary/Chest: Effort normal and breath sounds normal. He has no wheezes. He has no rales.  Abdominal: Soft. Bowel sounds are normal. He exhibits no distension. There is no tenderness. A hernia (nontender reducible umbilical hernia) is present.  Lymphadenopathy:    He has no cervical adenopathy.     Assessment    Umbilical hernia    Plan    I recommended repair at some point in the future.  This area has gotten larger and more symptomatic.  His obesity certainly contributes.  We discussed risks/benefits/postop course of both laparoscopic and open hernia repair.  I think lap repair with larger overlap given his habitus would produce  more durable repair but after a long discussion we decided to do open repair with same day surgery due to the issues he has at home.  i think this will still give a good repair.  We discussed risks including but not limited to bleeding, infection, injury to other structures, recurrence.  He will discuss at home and plan to schedule.        Jillisa Harris 04/18/2012, 9:08 PM    

## 2012-04-19 ENCOUNTER — Encounter: Payer: Self-pay | Admitting: Family Medicine

## 2012-04-20 ENCOUNTER — Other Ambulatory Visit: Payer: Self-pay | Admitting: Family Medicine

## 2012-04-20 DIAGNOSIS — R05 Cough: Secondary | ICD-10-CM

## 2012-04-20 MED ORDER — FLUTICASONE-SALMETEROL 250-50 MCG/DOSE IN AEPB: 1.0000 | INHALATION_SPRAY | Freq: Two times a day (BID) | RESPIRATORY_TRACT | Status: DC

## 2012-04-20 NOTE — Telephone Encounter (Signed)
Rx sent.    MW 

## 2012-05-04 ENCOUNTER — Encounter (HOSPITAL_BASED_OUTPATIENT_CLINIC_OR_DEPARTMENT_OTHER): Payer: Self-pay | Admitting: *Deleted

## 2012-05-04 ENCOUNTER — Encounter (HOSPITAL_BASED_OUTPATIENT_CLINIC_OR_DEPARTMENT_OTHER)
Admission: RE | Admit: 2012-05-04 | Discharge: 2012-05-04 | Disposition: A | Discharge status: Home / Self Care | Payer: BC Managed Care – PPO | Source: Ambulatory Visit | Attending: General Surgery | Admitting: General Surgery

## 2012-05-04 LAB — CBC WITH DIFFERENTIAL/PLATELET
Eosinophils Relative: 2 % (ref 0–5)
HCT: 45.1 % (ref 39.0–52.0)
Lymphocytes Relative: 26 % (ref 12–46)
Lymphs Abs: 1.4 10*3/uL (ref 0.7–4.0)
MCV: 92.8 fL (ref 78.0–100.0)
Monocytes Absolute: 0.8 10*3/uL (ref 0.1–1.0)
RBC: 4.86 MIL/uL (ref 4.22–5.81)
WBC: 5.4 10*3/uL (ref 4.0–10.5)

## 2012-05-04 LAB — BASIC METABOLIC PANEL
CO2: 22 mEq/L (ref 19–32)
Chloride: 106 mEq/L (ref 96–112)
Creatinine, Ser: 0.9 mg/dL (ref 0.50–1.35)

## 2012-05-04 NOTE — Progress Notes (Signed)
To come in for [id number]-[id number]-[id number]-pt has sleep apnea-has not used cpap 3 yr-in attic-sees dr wert prn-had PFT 08-normal-tx for chronic cough with advair and claritin Had routine stress test dr Deborah Chalk 06-normal- Told to bring all meds-and overnight bag in case he needs to stay due to sleep apnea

## 2012-05-05 ENCOUNTER — Ambulatory Visit (HOSPITAL_BASED_OUTPATIENT_CLINIC_OR_DEPARTMENT_OTHER)
Admission: RE | Admit: 2012-05-05 | Discharge: 2012-05-05 | Disposition: A | Discharge status: Home / Self Care | Payer: BC Managed Care – PPO | Source: Ambulatory Visit | Attending: General Surgery | Admitting: General Surgery

## 2012-05-05 ENCOUNTER — Encounter (HOSPITAL_BASED_OUTPATIENT_CLINIC_OR_DEPARTMENT_OTHER): Payer: Self-pay | Admitting: *Deleted

## 2012-05-05 ENCOUNTER — Encounter (HOSPITAL_BASED_OUTPATIENT_CLINIC_OR_DEPARTMENT_OTHER): Payer: Self-pay | Admitting: Anesthesiology

## 2012-05-05 ENCOUNTER — Encounter (HOSPITAL_BASED_OUTPATIENT_CLINIC_OR_DEPARTMENT_OTHER)
Admission: RE | Disposition: A | Discharge status: Home / Self Care | Payer: Self-pay | Source: Ambulatory Visit | Attending: General Surgery

## 2012-05-05 ENCOUNTER — Ambulatory Visit (HOSPITAL_BASED_OUTPATIENT_CLINIC_OR_DEPARTMENT_OTHER): Payer: BC Managed Care – PPO | Admitting: Anesthesiology

## 2012-05-05 DIAGNOSIS — Z6838 Body mass index (BMI) 38.0-38.9, adult: Secondary | ICD-10-CM | POA: Insufficient documentation

## 2012-05-05 DIAGNOSIS — Z8249 Family history of ischemic heart disease and other diseases of the circulatory system: Secondary | ICD-10-CM | POA: Insufficient documentation

## 2012-05-05 DIAGNOSIS — K429 Umbilical hernia without obstruction or gangrene: Secondary | ICD-10-CM | POA: Insufficient documentation

## 2012-05-05 DIAGNOSIS — Z01812 Encounter for preprocedural laboratory examination: Secondary | ICD-10-CM | POA: Insufficient documentation

## 2012-05-05 DIAGNOSIS — Z7982 Long term (current) use of aspirin: Secondary | ICD-10-CM | POA: Insufficient documentation

## 2012-05-05 DIAGNOSIS — Z8051 Family history of malignant neoplasm of kidney: Secondary | ICD-10-CM | POA: Insufficient documentation

## 2012-05-05 DIAGNOSIS — Z833 Family history of diabetes mellitus: Secondary | ICD-10-CM | POA: Insufficient documentation

## 2012-05-05 DIAGNOSIS — G4730 Sleep apnea, unspecified: Secondary | ICD-10-CM | POA: Insufficient documentation

## 2012-05-05 DIAGNOSIS — I10 Essential (primary) hypertension: Secondary | ICD-10-CM | POA: Insufficient documentation

## 2012-05-05 DIAGNOSIS — Z800 Family history of malignant neoplasm of digestive organs: Secondary | ICD-10-CM | POA: Insufficient documentation

## 2012-05-05 DIAGNOSIS — F411 Generalized anxiety disorder: Secondary | ICD-10-CM | POA: Insufficient documentation

## 2012-05-05 DIAGNOSIS — Z01810 Encounter for preprocedural cardiovascular examination: Secondary | ICD-10-CM | POA: Insufficient documentation

## 2012-05-05 HISTORY — DX: Major depressive disorder, single episode, unspecified: F32.9

## 2012-05-05 HISTORY — DX: Chronic kidney disease, unspecified: N18.9

## 2012-05-05 HISTORY — DX: Depression, unspecified: F32.A

## 2012-05-05 HISTORY — DX: Hyperlipidemia, unspecified: E78.5

## 2012-05-05 HISTORY — DX: Unspecified osteoarthritis, unspecified site: M19.90

## 2012-05-05 HISTORY — DX: Sleep apnea, unspecified: G47.30

## 2012-05-05 HISTORY — PX: UMBILICAL HERNIA REPAIR: SHX196

## 2012-05-05 HISTORY — PX: INSERTION OF MESH: SHX5868

## 2012-05-05 SURGERY — REPAIR, HERNIA, UMBILICAL, ADULT
Anesthesia: General | Site: Abdomen | Wound class: Clean

## 2012-05-05 MED ORDER — NEOSTIGMINE METHYLSULFATE 1 MG/ML IJ SOLN
INTRAMUSCULAR | Status: DC | PRN
  Administered 2012-05-05: 4 mg via INTRAVENOUS

## 2012-05-05 MED ORDER — FENTANYL CITRATE 0.05 MG/ML IJ SOLN
25.0000 ug | INTRAMUSCULAR | Status: DC | PRN
  Administered 2012-05-05 (×3): 50 ug via INTRAVENOUS

## 2012-05-05 MED ORDER — GLYCOPYRROLATE 0.2 MG/ML IJ SOLN
INTRAMUSCULAR | Status: DC | PRN
  Administered 2012-05-05: 0.6 mg via INTRAVENOUS

## 2012-05-05 MED ORDER — BUPIVACAINE HCL (PF) 0.25 % IJ SOLN
INTRAMUSCULAR | Status: DC | PRN
  Administered 2012-05-05: 10 mL

## 2012-05-05 MED ORDER — MIDAZOLAM HCL 5 MG/5ML IJ SOLN
INTRAMUSCULAR | Status: DC | PRN
  Administered 2012-05-05: 2 mg via INTRAVENOUS

## 2012-05-05 MED ORDER — ROCURONIUM BROMIDE 100 MG/10ML IV SOLN
INTRAVENOUS | Status: DC | PRN
  Administered 2012-05-05: 30 mg via INTRAVENOUS

## 2012-05-05 MED ORDER — DEXAMETHASONE SODIUM PHOSPHATE 4 MG/ML IJ SOLN
INTRAMUSCULAR | Status: DC | PRN
  Administered 2012-05-05: 10 mg via INTRAVENOUS

## 2012-05-05 MED ORDER — EPHEDRINE SULFATE 50 MG/ML IJ SOLN
INTRAMUSCULAR | Status: DC | PRN
  Administered 2012-05-05 (×2): 10 mg via INTRAVENOUS

## 2012-05-05 MED ORDER — MIDAZOLAM HCL 2 MG/2ML IJ SOLN: 0.5000 mg | Freq: Once | INTRAMUSCULAR | Status: DC | PRN

## 2012-05-05 MED ORDER — FENTANYL CITRATE 0.05 MG/ML IJ SOLN
INTRAMUSCULAR | Status: DC | PRN
  Administered 2012-05-05: 100 ug via INTRAVENOUS

## 2012-05-05 MED ORDER — PROMETHAZINE HCL 25 MG/ML IJ SOLN
6.2500 mg | INTRAMUSCULAR | Status: DC | PRN
  Administered 2012-05-05: 12.5 mg via INTRAVENOUS

## 2012-05-05 MED ORDER — SUCCINYLCHOLINE CHLORIDE 20 MG/ML IJ SOLN
INTRAMUSCULAR | Status: DC | PRN
  Administered 2012-05-05: 120 mg via INTRAVENOUS

## 2012-05-05 MED ORDER — OXYCODONE HCL 5 MG PO TABS: 5.0000 mg | ORAL_TABLET | Freq: Once | ORAL | Status: DC | PRN

## 2012-05-05 MED ORDER — OXYCODONE-ACETAMINOPHEN 5-325 MG PO TABS: 1.0000 | ORAL_TABLET | ORAL | Status: DC | PRN

## 2012-05-05 MED ORDER — LACTATED RINGERS IV SOLN
INTRAVENOUS | Status: DC
  Administered 2012-05-05 (×3): via INTRAVENOUS

## 2012-05-05 MED ORDER — ONDANSETRON HCL 4 MG/2ML IJ SOLN
INTRAMUSCULAR | Status: DC | PRN
  Administered 2012-05-05: 4 mg via INTRAVENOUS

## 2012-05-05 MED ORDER — CEFAZOLIN SODIUM-DEXTROSE 2-3 GM-% IV SOLR
2.0000 g | INTRAVENOUS | Status: AC
  Administered 2012-05-05: 2 g via INTRAVENOUS

## 2012-05-05 MED ORDER — MEPERIDINE HCL 25 MG/ML IJ SOLN: 6.2500 mg | INTRAMUSCULAR | Status: DC | PRN

## 2012-05-05 MED ORDER — PROPOFOL 10 MG/ML IV BOLUS
INTRAVENOUS | Status: DC | PRN
  Administered 2012-05-05: 20 mg via INTRAVENOUS

## 2012-05-05 MED ORDER — OXYCODONE HCL 5 MG/5ML PO SOLN: 5.0000 mg | Freq: Once | ORAL | Status: DC | PRN

## 2012-05-05 SURGICAL SUPPLY — 44 items
ADH SKN CLS APL DERMABOND .7 (GAUZE/BANDAGES/DRESSINGS)
APL SKNCLS STERI-STRIP NONHPOA (GAUZE/BANDAGES/DRESSINGS)
BENZOIN TINCTURE PRP APPL 2/3 (GAUZE/BANDAGES/DRESSINGS) IMPLANT
BLADE SURG 15 STRL LF DISP TIS (BLADE) ×2 IMPLANT
BLADE SURG 15 STRL SS (BLADE) ×3
BLADE SURG ROTATE 9660 (MISCELLANEOUS) ×1 IMPLANT
CHLORAPREP W/TINT 26ML (MISCELLANEOUS) ×3 IMPLANT
CLOTH BEACON ORANGE TIMEOUT ST (SAFETY) ×3 IMPLANT
COVER MAYO STAND STRL (DRAPES) ×3 IMPLANT
COVER TABLE BACK 60X90 (DRAPES) ×3 IMPLANT
DECANTER SPIKE VIAL GLASS SM (MISCELLANEOUS) IMPLANT
DERMABOND ADVANCED (GAUZE/BANDAGES/DRESSINGS)
DERMABOND ADVANCED .7 DNX12 (GAUZE/BANDAGES/DRESSINGS) IMPLANT
DRAPE PED LAPAROTOMY (DRAPES) ×3 IMPLANT
DRSG TEGADERM 4X4.75 (GAUZE/BANDAGES/DRESSINGS) IMPLANT
ELECT COATED BLADE 2.86 ST (ELECTRODE) ×3 IMPLANT
ELECT REM PT RETURN 9FT ADLT (ELECTROSURGICAL) ×3
ELECTRODE REM PT RTRN 9FT ADLT (ELECTROSURGICAL) ×2 IMPLANT
GLOVE BIO SURGEON STRL SZ7 (GLOVE) ×3 IMPLANT
GLOVE BIOGEL PI IND STRL 7.5 (GLOVE) ×2 IMPLANT
GLOVE BIOGEL PI INDICATOR 7.5 (GLOVE) ×1
GLOVE ECLIPSE 6.5 STRL STRAW (GLOVE) ×1 IMPLANT
GOWN PREVENTION PLUS XLARGE (GOWN DISPOSABLE) ×1 IMPLANT
NEEDLE HYPO 22GX1.5 SAFETY (NEEDLE) ×2 IMPLANT
NS IRRIG 1000ML POUR BTL (IV SOLUTION) IMPLANT
PACK BASIN DAY SURGERY FS (CUSTOM PROCEDURE TRAY) ×3 IMPLANT
PATCH VENTRAL MEDIUM 6.4 (Mesh Specialty) ×3 IMPLANT
PENCIL BUTTON HOLSTER BLD 10FT (ELECTRODE) ×3 IMPLANT
SLEEVE SCD COMPRESS KNEE MED (MISCELLANEOUS) ×1 IMPLANT
SPONGE LAP 4X18 X RAY DECT (DISPOSABLE) ×3 IMPLANT
STRIP CLOSURE SKIN 1/2X4 (GAUZE/BANDAGES/DRESSINGS) IMPLANT
SUT ETHIBOND 0 MO6 C/R (SUTURE) IMPLANT
SUT MNCRL AB 4-0 PS2 18 (SUTURE) ×3 IMPLANT
SUT PROLENE 2 0 CT2 30 (SUTURE) ×3 IMPLANT
SUT VIC AB 0 SH 27 (SUTURE) IMPLANT
SUT VIC AB 2-0 SH 27 (SUTURE)
SUT VIC AB 2-0 SH 27XBRD (SUTURE) IMPLANT
SUT VIC AB 3-0 SH 27 (SUTURE) ×3
SUT VIC AB 3-0 SH 27X BRD (SUTURE) ×2 IMPLANT
SUT VICRYL AB 3 0 TIES (SUTURE) IMPLANT
SYR CONTROL 10ML LL (SYRINGE) ×3 IMPLANT
TOWEL OR 17X24 6PK STRL BLUE (TOWEL DISPOSABLE) ×3 IMPLANT
TOWEL OR NON WOVEN STRL DISP B (DISPOSABLE) ×3 IMPLANT
WATER STERILE IRR 1000ML POUR (IV SOLUTION) ×2 IMPLANT

## 2012-05-05 NOTE — Anesthesia Preprocedure Evaluation (Signed)
Anesthesia Evaluation  Patient identified by MRN, date of birth, ID band Patient awake    Reviewed: Allergy & Precautions, H&P , NPO status , Patient's Chart, lab work & pertinent test results  History of Anesthesia Complications Negative for: history of anesthetic complications  Airway Mallampati: IV TM Distance: >3 FB Neck ROM: Full    Dental  (+) Dental Advisory Given and Missing   Pulmonary shortness of breath, sleep apnea (does not use his CPAP) ,  breath sounds clear to auscultation  Pulmonary exam normal       Cardiovascular hypertension, Pt. on medications Rhythm:Regular Rate:Normal     Neuro/Psych PSYCHIATRIC DISORDERS Anxiety Depression negative neurological ROS     GI/Hepatic negative GI ROS, Neg liver ROS,   Endo/Other  Morbid obesity  Renal/GU negative Renal ROS     Musculoskeletal negative musculoskeletal ROS (+)   Abdominal (+) + obese,   Peds  Hematology negative hematology ROS (+)   Anesthesia Other Findings   Reproductive/Obstetrics negative OB ROS                           Anesthesia Physical Anesthesia Plan  ASA: III  Anesthesia Plan: General   Post-op Pain Management:    Induction: Intravenous  Airway Management Planned: LMA  Additional Equipment:   Intra-op Plan:   Post-operative Plan:   Informed Consent: I have reviewed the patients History and Physical, chart, labs and discussed the procedure including the risks, benefits and alternatives for the proposed anesthesia with the patient or authorized representative who has indicated his/her understanding and acceptance.   Dental advisory given  Plan Discussed with: CRNA and Surgeon  Anesthesia Plan Comments: (Plan routine monitors, GA- LMA OK)        Anesthesia Quick Evaluation

## 2012-05-05 NOTE — Transfer of Care (Signed)
Immediate Anesthesia Transfer of Care Note  Patient: Taylor Harvey.  Procedure(s) Performed: Procedure(s) (LRB) with comments: HERNIA REPAIR UMBILICAL ADULT (N/A) INSERTION OF MESH (N/A)  Patient Location: PACU  Anesthesia Type:General  Level of Consciousness: awake, alert  and oriented  Airway & Oxygen Therapy: Patient Spontanous Breathing and Patient connected to face mask oxygen  Post-op Assessment: Report given to PACU RN and Post -op Vital signs reviewed and stable  Post vital signs: Reviewed and stable  Complications: No apparent anesthesia complications

## 2012-05-05 NOTE — Op Note (Signed)
Preoperative diagnosis: Symptomatic umbilical hernia Postoperative diagnosis: Same as above Procedure: Umbilical hernia repair with proceed ventral patch Surgeon: Dr. Harden Mo Anesthesia: Gen. Endotracheal Estimated blood loss: Minimal Specimens: None Drains: None Complications: None Sponge and needle count correct at end of operation Disposition to recovery in stable condition  Indications: This is a 61 year old male who has a symptomatic umbilical hernia. I saw him in the office and we discussed a laparoscopic versus an open repair. After long discussion we have chosen to proceed with an open repair with mesh. He and I discussed the risks and benefits prior to beginning.  Procedure: After informed consent was obtained the patient was taken to the operating room. He was administered 2 g of intravenous cefazolin. Sequential compression devices were on his legs. He was then placed under general endotracheal anesthesia without complication. His abdomen was prepped and draped in the standard sterile surgical fashion. A surgical timeout was performed.  I anesthetized below his umbilicus with quarter percent Marcaine. I then made a curvilinear incision below his umbilicus. I then encircled the umbilical stalk with a Kelly clamp. I then divided this. This left about a centimeter and a half hernia defect. Due to his body habitus I did elect to place a piece of mesh and I used a 6.4 cm proceed ventral patch. I placed this in this preperitoneal space. Initially had some trouble in this flap but eventually I was able to remove this once and replace as to what appeared to be in good position. I then tacked the connecting ribbons down to the fascia after pulling them up and apart with 2-0 Prolene suture. I did approximate the fascia as well over this as well as on the sides. The mesh appeared to be in good position upon completion. This was hemostatic. I then sewed the umbilicus down to 3-0 Vicryl in  several positions. I closed the dermis with 3-0 Vicryl and the skin with 4-0 Monocryl. I then placed Steri-Strips and a sterile dressing. He tolerated this well was extubated and transferred to the recovery room stable.

## 2012-05-05 NOTE — H&P (View-Only) (Signed)
Patient ID: Taylor Karner., male   DOB: 13-Aug-1950, 61 y.o.   MRN: [id number]  Chief Complaint  Patient presents with  . Pre-op Exam    eval umb hernia    HPI Taylor Bubeck. is a 61 y.o. male.  Referred by Dr. Laury Axon HPI 49 yom with several year history of enlarging umbilical mass that is present most of the time. This will reduce but has gotten bigger.  He has no change in bowel movements, no n/v.  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  He would like evaluated to discuss possible repair.  Past Medical History  Diagnosis Date  . Hypertension   . Anxiety     Past Surgical History  Procedure Date  . Trigger finger surgery 2006    Family History  Problem Relation Age of Onset  . Pancreatic cancer    . Diabetes Mother   . Pancreatitis Mother   . Heart disease Mother     MI  . Stroke Father   . Hypertension Father   . Heart disease Father   . Heart disease Sister   . Diabetes Sister 62    dm  . Cancer Sister     renal  . Diabetes Sister   . Diabetes Sister     Social History History  Substance Use Topics  . Smoking status: Never Smoker   . Smokeless tobacco: Never Used  . Alcohol Use: No    No Known Allergies  Current Outpatient Prescriptions  Medication Sig Dispense Refill  . ALPRAZolam (XANAX) 0.5 MG tablet Take 1 tablet (0.5 mg total) by mouth 3 (three) times daily as needed for sleep.  30 tablet  1  . aspirin 81 MG tablet Take 81 mg by mouth daily.        . citalopram (CELEXA) 20 MG tablet TAKE 1 TABLET BY MOUTH EVERY DAY  30 tablet  5  . HYDROcodone-acetaminophen (VICODIN) 5-500 MG per tablet Take 1 tablet by mouth every 6 (six) hours as needed.  30 tablet  0  . loratadine (CLARITIN) 10 MG tablet Take 10 mg by mouth daily.      Marland Kitchen MICARDIS 40 MG tablet TAKE 1 TABLET BY MOUTH EVERY DAY  30 tablet  5  . OVER THE COUNTER MEDICATION Somnapure--natural sleep aid 2 by mouth at bedtime      . simvastatin (ZOCOR) 10 MG tablet Take 1 tablet  (10 mg total) by mouth at bedtime.  30 tablet  2    Review of Systems Review of Systems  Constitutional: Negative for fever, chills and unexpected weight change.  HENT: Negative for hearing loss, congestion, sore throat, trouble swallowing and voice change.   Eyes: Negative for visual disturbance.  Respiratory: Negative for cough and wheezing.   Cardiovascular: Negative for chest pain, palpitations and leg swelling.  Gastrointestinal: Negative for nausea, vomiting, abdominal pain, diarrhea, constipation, blood in stool, abdominal distention, anal bleeding and rectal pain.  Genitourinary: Negative for hematuria and difficulty urinating.  Musculoskeletal: Negative for arthralgias.  Skin: Negative for rash and wound.  Neurological: Negative for seizures, syncope, weakness and headaches.  Hematological: Negative for adenopathy. Does not bruise/bleed easily.  Psychiatric/Behavioral: Negative for confusion.    Blood pressure 120/78, pulse 71, temperature 97.3 F (36.3 C), temperature source Temporal, height 5\' 7"  (1.702 m), weight 249 lb 12.8 oz (113.309 kg), SpO2 96.00%.  Physical Exam Physical Exam  Vitals reviewed. Constitutional: He appears well-developed and well-nourished.  Cardiovascular:  Normal rate, regular rhythm and normal heart sounds.   Pulmonary/Chest: Effort normal and breath sounds normal. He has no wheezes. He has no rales.  Abdominal: Soft. Bowel sounds are normal. He exhibits no distension. There is no tenderness. A hernia (nontender reducible umbilical hernia) is present.  Lymphadenopathy:    He has no cervical adenopathy.     Assessment    Umbilical hernia    Plan    I recommended repair at some point in the future.  This area has gotten larger and more symptomatic.  His obesity certainly contributes.  We discussed risks/benefits/postop course of both laparoscopic and open hernia repair.  I think lap repair with larger overlap given his habitus would produce  more durable repair but after a long discussion we decided to do open repair with same day surgery due to the issues he has at home.  i think this will still give a good repair.  We discussed risks including but not limited to bleeding, infection, injury to other structures, recurrence.  He will discuss at home and plan to schedule.        Taylor Harvey 04/18/2012, 9:08 PM

## 2012-05-05 NOTE — Anesthesia Postprocedure Evaluation (Signed)
  Anesthesia Post-op Note  Patient: Taylor Harvey.  Procedure(s) Performed: Procedure(s) (LRB) with comments: HERNIA REPAIR UMBILICAL ADULT (N/A) INSERTION OF MESH (N/A)  Patient Location: PACU  Anesthesia Type:General  Level of Consciousness: awake, alert , oriented and patient cooperative  Airway and Oxygen Therapy: Patient Spontanous Breathing  Post-op Pain: mild  Post-op Assessment: Post-op Vital signs reviewed, Patient's Cardiovascular Status Stable, Respiratory Function Stable, Patent Airway, No signs of Nausea or vomiting and Pain level controlled  Post-op Vital Signs: Reviewed and stable  Complications: No apparent anesthesia complications

## 2012-05-05 NOTE — Interval H&P Note (Signed)
History and Physical Interval Note:  05/05/2012 2:01 PM  Taylor Harvey.  has presented today for surgery, with the diagnosis of umbilical hernia  The various methods of treatment have been discussed with the patient and family. After consideration of risks, benefits and other options for treatment, the patient has consented to  Procedure(s) (LRB) with comments: HERNIA REPAIR UMBILICAL ADULT (N/A) INSERTION OF MESH (N/A) as a surgical intervention .  The patient's history has been reviewed, patient examined, no change in status, stable for surgery.  I have reviewed the patient's chart and labs.  Questions were answered to the patient's satisfaction.     Taylor Harvey

## 2012-05-06 ENCOUNTER — Encounter (HOSPITAL_BASED_OUTPATIENT_CLINIC_OR_DEPARTMENT_OTHER): Payer: Self-pay | Admitting: General Surgery

## 2012-05-11 ENCOUNTER — Other Ambulatory Visit: Payer: Self-pay | Admitting: Family Medicine

## 2012-05-11 NOTE — Telephone Encounter (Signed)
Last seen 03/29/12 and filled 01/20/12 #30 with 1 refill.  Please advise      KP

## 2012-05-28 ENCOUNTER — Ambulatory Visit (INDEPENDENT_AMBULATORY_CARE_PROVIDER_SITE_OTHER): Payer: BC Managed Care – PPO | Admitting: General Surgery

## 2012-05-28 ENCOUNTER — Encounter (INDEPENDENT_AMBULATORY_CARE_PROVIDER_SITE_OTHER): Payer: Self-pay | Admitting: General Surgery

## 2012-05-28 VITALS — BP 148/80 | HR 76 | Temp 97.8°F | Resp 16 | Ht 67.0 in | Wt 251.0 lb

## 2012-05-28 DIAGNOSIS — Z09 Encounter for follow-up examination after completed treatment for conditions other than malignant neoplasm: Secondary | ICD-10-CM

## 2012-05-28 NOTE — Progress Notes (Signed)
Subjective:     Patient ID: Taylor Rings., male   DOB: Mar 18, 1951, 62 y.o.   MRN: [id number]  HPI This is a 62 year old male who is 2 weeks status post an umbilical hernia repair with a mesh patch. He is doing well without any complaints. He took pain medication for a couple of days but has not taken any since then. He is having normal bowel movements and eating well. He wants to know when he can increase his activity.  Review of Systems     Objective:   Physical Exam Healing incision without infection    Assessment:     S/p uh repair with mesh    Plan:     I told him he could begin increasing his activity. I told him at 6 weeks postop he can have full normal activity. I told him I will plan on seeing him back as needed.

## 2012-07-25 ENCOUNTER — Other Ambulatory Visit: Payer: Self-pay | Admitting: Family Medicine

## 2012-08-31 ENCOUNTER — Other Ambulatory Visit: Payer: Self-pay | Admitting: Family Medicine

## 2012-09-09 ENCOUNTER — Other Ambulatory Visit: Payer: Self-pay | Admitting: Family Medicine

## 2012-10-04 ENCOUNTER — Other Ambulatory Visit: Payer: Self-pay | Admitting: Family Medicine

## 2012-11-05 ENCOUNTER — Other Ambulatory Visit: Payer: Self-pay | Admitting: Family Medicine

## 2012-11-12 ENCOUNTER — Ambulatory Visit (INDEPENDENT_AMBULATORY_CARE_PROVIDER_SITE_OTHER): Payer: BC Managed Care – PPO | Admitting: Family Medicine

## 2012-11-12 ENCOUNTER — Encounter: Payer: Self-pay | Admitting: Family Medicine

## 2012-11-12 VITALS — BP 110/75 | HR 77 | Temp 97.7°F | Wt 253.2 lb

## 2012-11-12 DIAGNOSIS — G4733 Obstructive sleep apnea (adult) (pediatric): Secondary | ICD-10-CM

## 2012-11-12 DIAGNOSIS — G4730 Sleep apnea, unspecified: Secondary | ICD-10-CM

## 2012-11-12 NOTE — Assessment & Plan Note (Signed)
Refer to pulm for evaluation Advised pt to not use xanax at night

## 2012-11-12 NOTE — Progress Notes (Signed)
  Subjective:    Patient ID: Taylor Rings., male    DOB: 1950/06/23, 62 y.o.   MRN: [id number]  HPI Pt here c/o insomnia for a while.  Its getting worse.  Pt was dx with sleep apnea about 15 years ago.  Sleep study done at [facility].  He is only getting about 4 hours of sleep at night.   He is willing to get re tested.     Review of Systems As above     Objective:   Physical Exam BP 110/75  Pulse 77  Temp(Src) 97.7 F (36.5 C) (Oral)  Wt 253 lb 3.2 oz (114.851 kg)  BMI 39.65 kg/m2  SpO2 93% General appearance: alert, cooperative, appears stated age and no distress Head: Normocephalic, without obvious abnormality, atraumatic Throat: lips, mucosa, and tongue normal; teeth and gums normal Neck: no adenopathy, supple, symmetrical, trachea midline, thyroid not enlarged, symmetric, no tenderness/mass/nodules and thick neck Lungs: clear to auscultation bilaterally Heart: S1, S2 normal Extremities: extremities normal, atraumatic, no cyanosis or edema        Assessment & Plan:

## 2012-11-12 NOTE — Patient Instructions (Signed)

## 2012-11-15 ENCOUNTER — Other Ambulatory Visit: Payer: Self-pay | Admitting: Family Medicine

## 2012-11-22 ENCOUNTER — Other Ambulatory Visit: Payer: Self-pay | Admitting: Family Medicine

## 2012-11-30 ENCOUNTER — Ambulatory Visit (INDEPENDENT_AMBULATORY_CARE_PROVIDER_SITE_OTHER): Payer: BC Managed Care – PPO | Admitting: Pulmonary Disease

## 2012-11-30 ENCOUNTER — Encounter: Payer: Self-pay | Admitting: Pulmonary Disease

## 2012-11-30 VITALS — BP 124/76 | HR 70 | Temp 98.6°F | Ht 67.0 in | Wt 246.5 lb

## 2012-11-30 DIAGNOSIS — G4730 Sleep apnea, unspecified: Secondary | ICD-10-CM

## 2012-11-30 NOTE — Assessment & Plan Note (Addendum)
Given excessive daytime somnolence, narrow pharyngeal exam, witnessed apneas & loud snoring, obstructive sleep apnea is very likely & an overnight polysomnogram will be scheduled as a split study. The pathophysiology of obstructive sleep apnea , it's cardiovascular consequences & modes of treatment including CPAP were discused with the patient in detail & they evidenced understanding. PSG 2005 - RDI [email or web address].info/ desatn 90% Will use O2 2 L , will very likely need trial CPAP He is willing to try this 50

## 2012-11-30 NOTE — Progress Notes (Signed)
Subjective:    Patient ID: Taylor Harvey., male    DOB: 12/25/1950, 62 y.o.   MRN: [id number]  HPI PCP - Laury Axon  62 year old obese never smoker presents for evaluation of obstructive sleep apnea. Polysomnogram in 2005 showed RDI of 21 events per hour desaturation to 90 %. This was corrected by CPAP of 11 cm. However he did not used CPAP for an extended period of time. 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 He reports trouble getting to deep sleep and excessive daytime somnolence. Epworth sleepiness score is 14. Bedtime is around midnight, sleep latency is 1-2 hours, he sleeps on his side with 2 pillows he cannot lay on his back detail apneas, he is 2-3 awakenings including nocturia and is out of bed at 7:30 AM feeling tired. He often takes naps in his recliner. He is gained about 10 pounds from 237-246 pounds since the sleep study. He used his wife oxygen for the last 2 weeks and felt much better with decreased sleep latency and decrease arousals and wonders if oxygen may not help him as much as CPAP. There is no history suggestive of cataplexy, sleep paralysis or parasomnias   Past Medical History  Diagnosis Date  . Hypertension   . Anxiety   . Sleep apnea 2005    moderate-has cpap-not used in 3 yr  . Chronic cough   . Hyperlipemia   . Arthritis   . Chronic kidney disease     hx kidney stones  . Depression     Past Surgical History  Procedure Laterality Date  . Trigger finger surgery  2006  . Lithotripsy      x2  . Colonoscopy    . Umbilical hernia repair  05/05/2012    Procedure: HERNIA REPAIR UMBILICAL ADULT;  Surgeon: Emelia Loron, MD;  Location: [facility];  Service: General;  Laterality: N/A;  . Insertion of mesh  05/05/2012    Procedure: INSERTION OF MESH;  Surgeon: Emelia Loron, MD;   Location:  SURGERY CENTER;  Service: General;  Laterality: N/A;    No Known Allergies  History   Social History  . Marital Status: Married    Spouse Name: N/A    Number of Children: N/A  . Years of Education: N/A   Occupational History  . retired    Social History Main Topics  . Smoking status: Never Smoker   . Smokeless tobacco: Never Used  . Alcohol Use: No  . Drug Use: No  . Sexually Active: Yes -- Male partner(s)   Other Topics Concern  . Not on file   Social History Narrative   Exercise--walks a mile 5 days a week    Review of Systems  Constitutional: Negative for fever and unexpected weight change.  HENT: Negative for ear pain, nosebleeds, congestion, sore throat, rhinorrhea, sneezing, trouble swallowing, dental problem, postnasal drip and sinus pressure.   Eyes: Negative for redness and itching.  Respiratory: Positive for shortness of breath. Negative for cough, chest tightness and wheezing.   Cardiovascular: Negative for palpitations and leg swelling.  Gastrointestinal: Negative for nausea and vomiting.  Genitourinary: Negative for dysuria.  Musculoskeletal: Negative for joint swelling.  Skin: Negative for rash.  Neurological: Negative for headaches.  Hematological: Does not bruise/bleed easily.  Psychiatric/Behavioral: Negative for dysphoric mood. The patient  is nervous/anxious.        Objective:   Physical Exam  Gen. Pleasant, obese, in no distress, normal affect ENT - no lesions, no post nasal drip, class 2-3 airway, neck size 18 inches Neck: No JVD, no thyromegaly, no carotid bruits Lungs: no use of accessory muscles, no dullness to percussion, decreased without rales or rhonchi  Cardiovascular: Rhythm regular, heart sounds  normal, no murmurs or gallops, no peripheral edema Abdomen: soft and non-tender, no hepatosplenomegaly, BS normal. Musculoskeletal: No deformities, no cyanosis or clubbing Neuro:  alert, non focal, no  tremors        Assessment & Plan:

## 2012-11-30 NOTE — Patient Instructions (Addendum)
Sleep study, will trial oxygen & CPAP

## 2012-12-05 ENCOUNTER — Other Ambulatory Visit: Payer: Self-pay | Admitting: Family Medicine

## 2012-12-27 ENCOUNTER — Ambulatory Visit (HOSPITAL_BASED_OUTPATIENT_CLINIC_OR_DEPARTMENT_OTHER): Payer: BC Managed Care – PPO | Attending: Pulmonary Disease

## 2012-12-27 DIAGNOSIS — G4730 Sleep apnea, unspecified: Secondary | ICD-10-CM

## 2012-12-27 DIAGNOSIS — G4733 Obstructive sleep apnea (adult) (pediatric): Secondary | ICD-10-CM | POA: Insufficient documentation

## 2012-12-28 ENCOUNTER — Telehealth: Payer: Self-pay | Admitting: Pulmonary Disease

## 2012-12-28 DIAGNOSIS — G4733 Obstructive sleep apnea (adult) (pediatric): Secondary | ICD-10-CM

## 2012-12-28 NOTE — Telephone Encounter (Signed)
PSG showed mild OSA Proceed , if willing, with trial of CPAP 10 cm, med FF mask, humidity, download in 4 wks

## 2012-12-29 NOTE — Procedures (Signed)
NAMECHERRY, WITTWER              ACCOUNT NO.:  [id number]  MEDICAL RECORD NO.:  [id number]          PATIENT TYPE:  OUT  LOCATION:  SLEEP CENTER                 FACILITY:  [facility]  PHYSICIAN:  Oretha Milch, MD      DATE OF BIRTH:  Jun 16, 1950  DATE OF STUDY:  12/27/2012                           NOCTURNAL POLYSOMNOGRAM  REFERRING PHYSICIAN:  Oretha Milch, MD  INDICATION FOR STUDY:  Mr. Bivins is a 62 year old gentleman with obstructive sleep apnea.  Polysomnogram in 2005 showed RDI of 21 events per hour with a desaturation to 90% corrected by CPAP of 11 cm.  Now presents with witnessed apneas, loud snoring, excessive daytime fatigue. At the time of this study, he weighed 245 pounds with a height of 5 feet 6 inches, BMI of 41, neck size of 18.5 inches.  EPWORTH SLEEPINESS SCORE:  Epworth sleepiness score was 10.  This nocturnal polysomnogram was performed with sleep technologist in attendance.  EEG, EOG, EMG, EKG, and respiratory parameters were recorded.  Sleep stages, arousals, limb movements, and respiratory data were scored according to criteria laid out by the American Academy of Sleep Medicine.   SLEEP ARCHITECTURE:  Lights off was at 10:20 p.m., lights on was at 4:53 a.m.  Total sleep time was 276 minutes with a sleep period time of 360 minutes with a sleep efficiency of 70%.  Sleep latency was 75 minutes. Latency to REM sleep was 254 minutes and awake after sleep onset was 42 minutes.  Sleep stages of the percentage of total sleep time was N1 11%, N2 76% N3 0.2%, and REM sleep 13% back at 35 minutes.  Supine sleep was noted for 1 minute.  Arousal Data:  There were 50 arousals with an arousal index of 11 events per hour.  Of these, 15 were spontaneous and the rest were associated with limb movement.  RESPIRATORY DATA:  There were 5 obstructive apneas, 1 central apneas, 1 mixed apnea, and 54 hypopneas with an apnea-hypopnea index of 13.3 events per hour.  Longest  hypopnea was 46 seconds.  OXYGEN DATA:  The desaturation index was 20 events per hour.  He spent 0.6 minutes with saturation less than 88% with a lowest desaturation of 85% during REM sleep.  CARDIAC DATA:  The low heart rate was 38 beats per minute.  The high heart rate recorded was an artifact.  No arrhythmias were noted.  MOVEMENT-PARASOMNIA:  The limb movement index was 47 events per hour, however, the PLM arousal index was only 2.6 events per hour.  Discussion:  He was desensitized with a medium full-face mask, but did not meet criteria for CPAP intervention.  IMPRESSIONS: 1. Mild obstructive sleep apnea with hypopneas causing sleep     fragmentation and mild oxygen desaturation. 2. Few PLMs were noted.  The PLM related arousals were very few.  The     significance of this is not clear with no evidence of cardiac     arrhythmias, or behavioral disturbance during sleep.  RECOMMENDATIONS: 1. The treatment options for this degree of sleep-disordered breathing     include weight loss, oral appliance or CPAP therapy. 2. He should be asked to avoid medications  with sedative side effects.     He should be cautioned against driving when sleepy.     Oretha Milch, MD    RVA/MEDQ  D:  12/28/2012 12:59:30  T:  12/29/2012 02:07:58  Job:  [id number]

## 2012-12-29 NOTE — Telephone Encounter (Signed)
I spoke with patient about results and he verbalized understanding and had no questions. Pt stated he is willing to set this up. Order was already placed. Please advise PCC's thanks

## 2013-01-10 ENCOUNTER — Other Ambulatory Visit: Payer: Self-pay | Admitting: Family Medicine

## 2013-02-14 ENCOUNTER — Other Ambulatory Visit: Payer: Self-pay | Admitting: Family Medicine

## 2013-03-14 ENCOUNTER — Other Ambulatory Visit: Payer: Self-pay | Admitting: Family Medicine

## 2013-03-21 ENCOUNTER — Encounter: Payer: Self-pay | Admitting: Family Medicine

## 2013-03-21 ENCOUNTER — Ambulatory Visit (INDEPENDENT_AMBULATORY_CARE_PROVIDER_SITE_OTHER): Payer: BC Managed Care – PPO | Admitting: Family Medicine

## 2013-03-21 VITALS — BP 122/68 | HR 67 | Temp 97.9°F | Wt 258.0 lb

## 2013-03-21 DIAGNOSIS — Z23 Encounter for immunization: Secondary | ICD-10-CM

## 2013-03-21 DIAGNOSIS — R350 Frequency of micturition: Secondary | ICD-10-CM

## 2013-03-21 LAB — POCT URINALYSIS DIPSTICK
Bilirubin, UA: NEGATIVE
Blood, UA: NEGATIVE
Nitrite, UA: NEGATIVE
Protein, UA: NEGATIVE
Urobilinogen, UA: 0.2
pH, UA: 7.5

## 2013-03-21 MED ORDER — CIPROFLOXACIN HCL 500 MG PO TABS: 500.0000 mg | ORAL_TABLET | Freq: Two times a day (BID) | ORAL | Status: DC

## 2013-03-21 NOTE — Patient Instructions (Addendum)
  rto as scheduled  Urinary Tract Infection Urinary tract infections (UTIs) can develop anywhere along your urinary tract. Your urinary tract is your body's drainage system for removing wastes and extra water. Your urinary tract includes two kidneys, two ureters, a bladder, and a urethra. Your kidneys are a pair of bean-shaped organs. Each kidney is about the size of your fist. They are located below your ribs, one on each side of your spine. CAUSES Infections are caused by microbes, which are microscopic organisms, including fungi, viruses, and bacteria. These organisms are so small that they can only be seen through a microscope. Bacteria are the microbes that most commonly cause UTIs. SYMPTOMS  Symptoms of UTIs may vary by age and gender of the patient and by the location of the infection. Symptoms in young women typically include a frequent and intense urge to urinate and a painful, burning feeling in the bladder or urethra during urination. Older women and men are more likely to be tired, shaky, and weak and have muscle aches and abdominal pain. A fever may mean the infection is in your kidneys. Other symptoms of a kidney infection include pain in your back or sides below the ribs, nausea, and vomiting. DIAGNOSIS To diagnose a UTI, your caregiver will ask you about your symptoms. Your caregiver also will ask to provide a urine sample. The urine sample will be tested for bacteria and white blood cells. White blood cells are made by your body to help fight infection. TREATMENT  Typically, UTIs can be treated with medication. Because most UTIs are caused by a bacterial infection, they usually can be treated with the use of antibiotics. The choice of antibiotic and length of treatment depend on your symptoms and the type of bacteria causing your infection. HOME CARE INSTRUCTIONS  If you were prescribed antibiotics, take them exactly as your caregiver instructs you. Finish the medication even if you  feel better after you have only taken some of the medication.  Drink enough water and fluids to keep your urine clear or pale yellow.  Avoid caffeine, tea, and carbonated beverages. They tend to irritate your bladder.  Empty your bladder often. Avoid holding urine for long periods of time.  Empty your bladder before and after sexual intercourse.  After a bowel movement, women should cleanse from front to back. Use each tissue only once. SEEK MEDICAL CARE IF:   You have back pain.  You develop a fever.  Your symptoms do not begin to resolve within 3 days. SEEK IMMEDIATE MEDICAL CARE IF:   You have severe back pain or lower abdominal pain.  You develop chills.  You have nausea or vomiting.  You have continued burning or discomfort with urination. MAKE SURE YOU:   Understand these instructions.  Will watch your condition.  Will get help right away if you are not doing well or get worse. Document Released: 02/19/2005 Document Revised: 11/11/2011 Document Reviewed: 06/20/2011 [facility] Patient Information 2014 [place], [place].

## 2013-03-21 NOTE — Progress Notes (Signed)
  Subjective:    Taylor Harvey. is a 62 y.o. male who complains of abnormal smelling urine, burning with urination and frequency. He has had symptoms for 3 weeks. Patient also complains of back pain. Patient denies congestion, cough, fever, headache, rhinitis, sorethroat, stomach ache and vaginal discharge. Patient does not have a history of recurrent UTI. Patient does not have a history of pyelonephritis.   The following portions of the patient's history were reviewed and updated as appropriate: allergies, current medications, past family history, past medical history, past social history, past surgical history and problem list.  Review of Systems Pertinent items are noted in HPI.    Objective:    BP 122/68  Pulse 67  Temp(Src) 97.9 F (36.6 C) (Oral)  Wt 258 lb (117.028 kg)  BMI 40.4 kg/m2  SpO2 96% General appearance: alert, cooperative, appears stated age and no distress Back: symmetric, no curvature. ROM normal. No CVA tenderness. Abdomen: soft, non-tender; bowel sounds normal; no masses,  no organomegaly  Laboratory:  Urine dipstick: 3+ for leukocyte esterase.   Micro exam: not done.    Assessment:    dysuria     Plan:    Medications: ciprofloxacin. Maintain adequate hydration. Follow up if symptoms not improving, and as needed.

## 2013-03-21 NOTE — Addendum Note (Signed)
Addended by: Arnette Norris on: 03/21/2013 01:26 PM   Modules accepted: Orders

## 2013-03-23 LAB — URINE CULTURE

## 2013-04-11 ENCOUNTER — Encounter: Payer: BC Managed Care – PPO | Admitting: Family Medicine

## 2013-04-18 ENCOUNTER — Other Ambulatory Visit: Payer: Self-pay | Admitting: Family Medicine

## 2013-05-06 ENCOUNTER — Encounter: Payer: Self-pay | Admitting: Internal Medicine

## 2013-05-15 ENCOUNTER — Other Ambulatory Visit: Payer: Self-pay | Admitting: Family Medicine

## 2013-05-20 ENCOUNTER — Other Ambulatory Visit: Payer: Self-pay | Admitting: Family Medicine

## 2013-06-09 ENCOUNTER — Other Ambulatory Visit: Payer: Self-pay | Admitting: Family Medicine

## 2013-06-09 DIAGNOSIS — E785 Hyperlipidemia, unspecified: Secondary | ICD-10-CM

## 2013-06-09 NOTE — Telephone Encounter (Signed)
Refill for zocor sent to [facility] 

## 2013-06-17 ENCOUNTER — Encounter: Payer: Self-pay | Admitting: Internal Medicine

## 2013-06-17 ENCOUNTER — Ambulatory Visit (INDEPENDENT_AMBULATORY_CARE_PROVIDER_SITE_OTHER): Payer: BC Managed Care – PPO | Admitting: Internal Medicine

## 2013-06-17 VITALS — BP 137/78 | HR 76 | Temp 98.3°F | Wt 260.0 lb

## 2013-06-17 DIAGNOSIS — J40 Bronchitis, not specified as acute or chronic: Secondary | ICD-10-CM

## 2013-06-17 MED ORDER — DOXYCYCLINE HYCLATE 100 MG PO TABS: 100.0000 mg | ORAL_TABLET | Freq: Two times a day (BID) | ORAL | Status: DC

## 2013-06-17 NOTE — Progress Notes (Signed)
   Subjective:    Patient ID: Taylor Harvey., male    DOB: 1950-09-16, 63 y.o.   MRN: [id number]  HPI Acute visit Symptoms started 6 days ago with chills, fever, sore throat, ear ache and cough. For the last 3 days sx better except for the cough which is associated with greenish sputum, chest congestion and sinus pressure. Some nosebleeds as well.   Past Medical History  Diagnosis Date  . Hypertension   . Anxiety   . Sleep apnea 2005    moderate-has cpap-not used in 3 yr  . Chronic cough   . Hyperlipemia   . Arthritis   . Chronic kidney disease     hx kidney stones  . Depression    Past Surgical History  Procedure Laterality Date  . Trigger finger surgery  2006  . Lithotripsy      x2  . Colonoscopy    . Umbilical hernia repair  05/05/2012    Procedure: HERNIA REPAIR UMBILICAL ADULT;  Surgeon: Rolm Bookbinder, MD;  Location: [place];  Service: General;  Laterality: N/A;  . Insertion of mesh  05/05/2012    Procedure: INSERTION OF MESH;  Surgeon: Rolm Bookbinder, MD;  Location: [place];  Service: General;  Laterality: N/A;   History  Substance Use Topics  . Smoking status: Never Smoker   . Smokeless tobacco: Never Used  . Alcohol Use: No     Review of Systems Had myalgias with the onset of symptoms, no nausea or vomiting, + mild diarrhea. Wife and son are sick with similar symptoms    Objective:   Physical Exam  BP 137/78  Pulse 76  Temp(Src) 98.3 F (36.8 C)  Wt 260 lb (117.935 kg)  SpO2 93% General -- alert, well-developed, NAD.   HEENT-- Not pale. TMs normal, throat symmetric, no redness or discharge. Face symmetric, sinuses midly  tender to palpation (frontal , maxillary sinuses ok). Nose  congested.  Lungs -- normal respiratory effort, no intercostal retractions, no accessory muscle use, and minimal ronchi w/ cough,no wheezing Heart-- normal rate, regular rhythm, no murmur.   Extremities-- no pretibial edema  bilaterally  Neurologic--  alert & oriented X3. Speech normal, gait normal, strength normal in all extremities.   Psych-- Cognition and judgment appear intact. Cooperative with normal attention span and concentration. No anxious or depressed appearing.     Assessment & Plan:  Bronchitis, The patient probably had a viral syndrome and now has symptoms consistent with bronchitis. See instructions

## 2013-06-17 NOTE — Progress Notes (Signed)
Pre visit review using our clinic review tool, if applicable. No additional management support is needed unless otherwise documented below in the visit note. 

## 2013-06-17 NOTE — Patient Instructions (Signed)
For cough, take Mucinex DM twice a day as needed  For congestion use OTC Nasocort: 2 nasal sprays on each side of the nose daily until you feel better Use advair consistently until better  Take the antibiotic as prescribed  (doxy) Call if no better in few days Call anytime if the symptoms are severe

## 2013-06-23 ENCOUNTER — Other Ambulatory Visit: Payer: Self-pay | Admitting: Family Medicine

## 2013-06-24 ENCOUNTER — Telehealth: Payer: Self-pay

## 2013-06-24 NOTE — Telephone Encounter (Signed)
Left message for call back. Non-identifiable   Flu-03/21/13 Td- 03/04/07 Shingles- 03/29/12 CCS- 06/11/06- polyps (type not specified in chart) and diverticulosis.  Recommended repeat in January 2018.   PSA- 01/20/12- 0.28

## 2013-06-28 ENCOUNTER — Encounter: Payer: Self-pay | Admitting: Family Medicine

## 2013-06-28 ENCOUNTER — Ambulatory Visit (INDEPENDENT_AMBULATORY_CARE_PROVIDER_SITE_OTHER): Payer: BC Managed Care – PPO | Admitting: Family Medicine

## 2013-06-28 VITALS — BP 132/74 | HR 74 | Temp 97.8°F | Ht 68.5 in | Wt 257.2 lb

## 2013-06-28 DIAGNOSIS — S139XXA Sprain of joints and ligaments of unspecified parts of neck, initial encounter: Secondary | ICD-10-CM

## 2013-06-28 DIAGNOSIS — F411 Generalized anxiety disorder: Secondary | ICD-10-CM

## 2013-06-28 DIAGNOSIS — Z0000 Encounter for general adult medical examination without abnormal findings: Secondary | ICD-10-CM

## 2013-06-28 DIAGNOSIS — F329 Major depressive disorder, single episode, unspecified: Secondary | ICD-10-CM

## 2013-06-28 DIAGNOSIS — F32A Depression, unspecified: Secondary | ICD-10-CM

## 2013-06-28 DIAGNOSIS — F3289 Other specified depressive episodes: Secondary | ICD-10-CM

## 2013-06-28 DIAGNOSIS — I10 Essential (primary) hypertension: Secondary | ICD-10-CM

## 2013-06-28 DIAGNOSIS — G4730 Sleep apnea, unspecified: Secondary | ICD-10-CM

## 2013-06-28 DIAGNOSIS — G43909 Migraine, unspecified, not intractable, without status migrainosus: Secondary | ICD-10-CM

## 2013-06-28 DIAGNOSIS — E785 Hyperlipidemia, unspecified: Secondary | ICD-10-CM

## 2013-06-28 LAB — CBC WITH DIFFERENTIAL/PLATELET
BASOS ABS: 0 10*3/uL (ref 0.0–0.1)
Basophils Relative: 0.2 % (ref 0.0–3.0)
Eosinophils Absolute: 0.1 10*3/uL (ref 0.0–0.7)
Eosinophils Relative: 1.3 % (ref 0.0–5.0)
HCT: 45.3 % (ref 39.0–52.0)
Hemoglobin: 15 g/dL (ref 13.0–17.0)
Lymphocytes Relative: 29.4 % (ref 12.0–46.0)
Lymphs Abs: 1.4 10*3/uL (ref 0.7–4.0)
MCHC: 33.1 g/dL (ref 30.0–36.0)
MCV: 94.4 fl (ref 78.0–100.0)
MONOS PCT: 8.2 % (ref 3.0–12.0)
Monocytes Absolute: 0.4 10*3/uL (ref 0.1–1.0)
NEUTROS PCT: 60.9 % (ref 43.0–77.0)
Neutro Abs: 3 10*3/uL (ref 1.4–7.7)
PLATELETS: 198 10*3/uL (ref 150.0–400.0)
RBC: 4.8 Mil/uL (ref 4.22–5.81)
RDW: 13.6 % (ref 11.5–14.6)
WBC: 4.9 10*3/uL (ref 4.5–10.5)

## 2013-06-28 LAB — LIPID PANEL
CHOL/HDL RATIO: 4
Cholesterol: 119 mg/dL (ref 0–200)
HDL: 30.5 mg/dL — ABNORMAL LOW (ref >39.00)
LDL CALC: 60 mg/dL (ref 0–99)
TRIGLYCERIDES: 143 mg/dL (ref 0.0–149.0)
VLDL: 28.6 mg/dL (ref 0.0–40.0)

## 2013-06-28 LAB — POCT URINALYSIS DIPSTICK
Bilirubin, UA: NEGATIVE
GLUCOSE UA: NEGATIVE
Ketones, UA: NEGATIVE
Leukocytes, UA: NEGATIVE
NITRITE UA: NEGATIVE
PROTEIN UA: NEGATIVE
RBC UA: NEGATIVE
Spec Grav, UA: >=1.03
UROBILINOGEN UA: 0.2
pH, UA: 6

## 2013-06-28 LAB — HEPATIC FUNCTION PANEL
ALT: 52 U/L (ref 0–53)
AST: 39 U/L — AB (ref 0–37)
Albumin: 3.8 g/dL (ref 3.5–5.2)
Alkaline Phosphatase: 94 U/L (ref 39–117)
BILIRUBIN DIRECT: 0.1 mg/dL (ref 0.0–0.3)
BILIRUBIN TOTAL: 0.8 mg/dL (ref 0.3–1.2)
Total Protein: 6.4 g/dL (ref 6.0–8.3)

## 2013-06-28 LAB — BASIC METABOLIC PANEL
BUN: 12 mg/dL (ref 6–23)
CALCIUM: 9 mg/dL (ref 8.4–10.5)
CO2: 26 mEq/L (ref 19–32)
CREATININE: 0.9 mg/dL (ref 0.4–1.5)
Chloride: 106 mEq/L (ref 96–112)
GFR: 86.92 mL/min (ref >60.00)
Glucose, Bld: 115 mg/dL — ABNORMAL HIGH (ref 70–99)
POTASSIUM: 4 meq/L (ref 3.5–5.1)
Sodium: 139 mEq/L (ref 135–145)

## 2013-06-28 LAB — TSH: TSH: 0.77 u[IU]/mL (ref 0.35–5.50)

## 2013-06-28 LAB — PSA: PSA: 0.31 ng/mL (ref 0.10–4.00)

## 2013-06-28 MED ORDER — CITALOPRAM HYDROBROMIDE 20 MG PO TABS
ORAL_TABLET | ORAL | Status: DC
Start: 2013-06-28 — End: 2014-03-07

## 2013-06-28 MED ORDER — HYDROCODONE-ACETAMINOPHEN 5-500 MG PO TABS: 1.0000 | ORAL_TABLET | Freq: Four times a day (QID) | ORAL | Status: DC | PRN

## 2013-06-28 MED ORDER — SIMVASTATIN 10 MG PO TABS: ORAL_TABLET | ORAL | Status: DC

## 2013-06-28 NOTE — Progress Notes (Signed)
Subjective:    Patient ID: Taylor Hua., male    DOB: [birth year], 63 y.o.   MRN: [id number]  HPI Pt here for cpe and labs.   No complaints.   Past Medical History  Diagnosis Date  . Hypertension   . Anxiety   . Sleep apnea 2005    moderate-has cpap-not used in 3 yr  . Chronic cough   . Hyperlipemia   . Arthritis   . Chronic kidney disease     hx kidney stones  . Depression    History   Social History  . Marital Status: Married    Spouse Name: N/A    Number of Children: N/A  . Years of Education: N/A   Occupational History  . retired    Social History Main Topics  . Smoking status: Never Smoker   . Smokeless tobacco: Never Used  . Alcohol Use: No  . Drug Use: No  . Sexual Activity: Yes    Partners: Female   Other Topics Concern  . Not on file   Social History Narrative   Exercise--walks a mile 5 days a week   Past Surgical History  Procedure Laterality Date  . Trigger finger surgery  2006  . Lithotripsy      x2  . Colonoscopy    . Umbilical hernia repair  05/05/2012    Procedure: HERNIA REPAIR UMBILICAL ADULT;  Surgeon: Rolm Bookbinder, MD;  Location: [place];  Service: General;  Laterality: N/A;  . Insertion of mesh  05/05/2012    Procedure: INSERTION OF MESH;  Surgeon: Rolm Bookbinder, MD;  Location: [place];  Service: General;  Laterality: N/A;    Family History  Problem Relation Age of Onset  . Pancreatic cancer    . Diabetes Mother   . Pancreatitis Mother   . Heart disease Mother     MI  . Stroke Father   . Hypertension Father   . Heart disease Father   . Heart disease Sister   . Diabetes Sister 62    dm  . Cancer Sister     renal  . Diabetes Sister   . Diabetes Sister    Current Outpatient Prescriptions on File Prior to Visit  Medication Sig Dispense Refill  . ADVAIR DISKUS 250-50 MCG/DOSE AEPB INHALE 1 PUFF INTO THE LUNGS TWICE DAILY  1 each  5  . aspirin 81 MG tablet Take 81 mg by  mouth daily.        Marland Kitchen esomeprazole (NEXIUM) 40 MG capsule Take 40 mg by mouth daily.      Marland Kitchen telmisartan (MICARDIS) 40 MG tablet TAKE ONE TABLET BY MOUTH ONCE DAILY  30 tablet  5   No current facility-administered medications on file prior to visit.     Review of Systems Review of Systems  Constitutional: Negative for activity change, appetite change and fatigue.  HENT: Negative for hearing loss, congestion, tinnitus and ear discharge.  dentist --no Eyes: Negative for visual disturbance (see optho q1y -- vision corrected to 20/20 with glasses).  Respiratory: Negative for cough, chest tightness and shortness of breath.   Cardiovascular: Negative for chest pain, palpitations and leg swelling.  Gastrointestinal: Negative for abdominal pain, diarrhea, constipation and abdominal distention.  Genitourinary: Negative for urgency, frequency, decreased urine volume and difficulty urinating.  Musculoskeletal: Negative for back pain, arthralgias and gait problem.  Skin: Negative for color change, pallor and rash.  Neurological: Negative for dizziness, light-headedness, numbness and headaches.  Hematological: Negative for adenopathy. Does not bruise/bleed easily.  Psychiatric/Behavioral: Negative for suicidal ideas, confusion, sleep disturbance, self-injury, dysphoric mood, decreased concentration and agitation.         Objective:   Physical Exam   BP 132/74  Pulse 74  Temp(Src) 97.8 F (36.6 C) (Oral)  Ht 5' 8.5" (1.74 m)  Wt 257 lb 3.2 oz (116.665 kg)  BMI 38.53 kg/m2  SpO2 96% General appearance: alert, cooperative, appears stated age and no distress Head: Normocephalic, without obvious abnormality, atraumatic Eyes: conjunctivae/corneas clear. PERRL, EOM's intact. Fundi benign. Ears: normal TM's and external ear canals both ears Nose: Nares normal. Septum midline. Mucosa normal. No drainage or sinus tenderness. Throat: lips, mucosa, and tongue normal; teeth and gums normal Neck: no  adenopathy, no carotid bruit, no JVD, supple, symmetrical, trachea midline and thyroid not enlarged, symmetric, no tenderness/mass/nodules Back: symmetric, no curvature. ROM normal. No CVA tenderness. Lungs: clear to auscultation bilaterally Chest wall: no tenderness Heart: regular rate and rhythm, S1, S2 normal, no murmur, click, rub or gallop Abdomen: soft, non-tender; bowel sounds normal; no masses,  no organomegaly Male genitalia: normal, penis: no lesions or discharge. testes: no masses or tenderness. no hernias Rectal: normal tone, normal prostate, no masses or tenderness and soft brown guaiac negative stool noted Extremities: extremities normal, atraumatic, no cyanosis or edema Pulses: 2+ and symmetric Skin: Skin color, texture, turgor normal. No rashes or lesions Lymph nodes: Cervical, supraclavicular, and axillary nodes normal. Neurologic: Alert and oriented X 3, normal strength and tone. Normal symmetric reflexes. Normal coordination and gait Psych-- no depression, no anxiety  Assessment & Plan:  cpe--- see avs            ghm utd           Check labs

## 2013-06-28 NOTE — Assessment & Plan Note (Signed)
Check labs Cont' meds 

## 2013-06-28 NOTE — Patient Instructions (Signed)
Preventive Care for Adults, Male A healthy lifestyle and preventive care can promote health and wellness. Preventive health guidelines for men include the following key practices:  A routine yearly physical is a good way to check with your health care provider about your health and preventative screening. It is a chance to share any concerns and updates on your health and to receive a thorough exam.  Visit your dentist for a routine exam and preventative care every 6 months. Brush your teeth twice a day and floss once a day. Good oral hygiene prevents tooth decay and gum disease.  The frequency of eye exams is based on your age, health, family medical history, use of contact lenses, and other factors. Follow your health care provider's recommendations for frequency of eye exams.  Eat a healthy diet. Foods such as vegetables, fruits, whole grains, low-fat dairy products, and lean protein foods contain the nutrients you need without too many calories. Decrease your intake of foods high in solid fats, added sugars, and salt. Eat the right amount of calories for you.Get information about a proper diet from your health care provider, if necessary.  Regular physical exercise is one of the most important things you can do for your health. Most adults should get at least 150 minutes of moderate-intensity exercise (any activity that increases your heart rate and causes you to sweat) each week. In addition, most adults need muscle-strengthening exercises on 2 or more days a week.  Maintain a healthy weight. The body mass index (BMI) is a screening tool to identify possible weight problems. It provides an estimate of body fat based on height and weight. Your health care provider can find your BMI and can help you achieve or maintain a healthy weight.For adults 20 years and older:  A BMI below 18.5 is considered underweight.  A BMI of 18.5 to 24.9 is normal.  A BMI of 25 to 29.9 is considered  overweight.  A BMI of 30 and above is considered obese.  Maintain normal blood lipids and cholesterol levels by exercising and minimizing your intake of saturated fat. Eat a balanced diet with plenty of fruit and vegetables. Blood tests for lipids and cholesterol should begin at age 42 and be repeated every 5 years. If your lipid or cholesterol levels are high, you are over 50, or you are at high risk for heart disease, you may need your cholesterol levels checked more frequently.Ongoing high lipid and cholesterol levels should be treated with medicines if diet and exercise are not working.  If you smoke, find out from your health care provider how to quit. If you do not use tobacco, do not start.  Lung cancer screening is recommended for adults aged 24 80 years who are at high risk for developing lung cancer because of a history of smoking. A yearly low-dose CT scan of the lungs is recommended for people who have at least a 30-pack-year history of smoking and are a current smoker or have quit within the past 15 years. A pack year of smoking is smoking an average of 1 pack of cigarettes a day for 1 year (for example: 1 pack a day for 30 years or 2 packs a day for 15 years). Yearly screening should continue until the smoker has stopped smoking for at least 15 years. Yearly screening should be stopped for people who develop a health problem that would prevent them from having lung cancer treatment.  If you choose to drink alcohol, do not have  more than 2 drinks per day. One drink is considered to be 12 ounces (355 mL) of beer, 5 ounces (148 mL) of wine, or 1.5 ounces (44 mL) of liquor.  Avoid use of street drugs. Do not share needles with anyone. Ask for help if you need support or instructions about stopping the use of drugs.  High blood pressure causes heart disease and increases the risk of stroke. Your blood pressure should be checked at least every 1 2 years. Ongoing high blood pressure should be  treated with medicines, if weight loss and exercise are not effective.  If you are 75 64 years old, ask your health care provider if you should take aspirin to prevent heart disease.  Diabetes screening involves taking a blood sample to check your fasting blood sugar level. This should be done once every 3 years, after age 19, if you are within normal weight and without risk factors for diabetes. Testing should be considered at a younger age or be carried out more frequently if you are overweight and have at least 1 risk factor for diabetes.  Colorectal cancer can be detected and often prevented. Most routine colorectal cancer screening begins at the age of 47 and continues through age 80. However, your health care provider may recommend screening at an earlier age if you have risk factors for colon cancer. On a yearly basis, your health care provider may provide home test kits to check for hidden blood in the stool. Use of a small camera at the end of a tube to directly examine the colon (sigmoidoscopy or colonoscopy) can detect the earliest forms of colorectal cancer. Talk to your health care provider about this at age 66, when routine screening begins. Direct exam of the colon should be repeated every 5 10 years through age 19, unless early forms of precancerous polyps or small growths are found.  People who are at an increased risk for hepatitis B should be screened for this virus. You are considered at high risk for hepatitis B if:  You were born in a country where hepatitis B occurs often. Talk with your health care provider about which countries are considered high-risk.  Your parents were born in a high-risk country and you have not received a shot to protect against hepatitis B (hepatitis B vaccine).  You have HIV or AIDS.  You use needles to inject street drugs.  You live with, or have sex with, someone who has hepatitis B.  You are a man who has sex with other men (MSM).  You get  hemodialysis treatment.  You take certain medicines for conditions such as cancer, organ transplantation, and autoimmune conditions.  Hepatitis C blood testing is recommended for all people born from [birth year] through 1965 and any individual with known risks for hepatitis C.  Practice safe sex. Use condoms and avoid high-risk sexual practices to reduce the spread of sexually transmitted infections (STIs). STIs include gonorrhea, chlamydia, syphilis, trichomonas, herpes, HPV, and human immunodeficiency virus (HIV). Herpes, HIV, and HPV are viral illnesses that have no cure. They can result in disability, cancer, and death.  A one-time screening for abdominal aortic aneurysm (AAA) and surgical repair of large AAAs by ultrasound are recommended for men ages 94 to 74 years who are current or former smokers.  Healthy men should no longer receive prostate-specific antigen (PSA) blood tests as part of routine cancer screening. Talk with your health care provider about prostate cancer screening.  Testicular cancer screening is not recommended  for adult males who have no symptoms. Screening includes self-exam, a health care provider exam, and other screening tests. Consult with your health care provider about any symptoms you have or any concerns you have about testicular cancer.  Use sunscreen. Apply sunscreen liberally and repeatedly throughout the day. You should seek shade when your shadow is shorter than you. Protect yourself by wearing long sleeves, pants, a wide-brimmed hat, and sunglasses year round, whenever you are outdoors.  Once a month, do a whole-body skin exam, using a mirror to look at the skin on your back. Tell your health care provider about new moles, moles that have irregular borders, moles that are larger than a pencil eraser, or moles that have changed in shape or color.  Stay current with required vaccines (immunizations).  Influenza vaccine. All adults should be immunized every  year.  Tetanus, diphtheria, and acellular pertussis (Td, Tdap) vaccine. An adult who has not previously received Tdap or who does not know his vaccine status should receive 1 dose of Tdap. This initial dose should be followed by tetanus and diphtheria toxoids (Td) booster doses every 10 years. Adults with an unknown or incomplete history of completing a 3-dose immunization series with Td-containing vaccines should begin or complete a primary immunization series including a Tdap dose. Adults should receive a Td booster every 10 years.  Varicella vaccine. An adult without evidence of immunity to varicella should receive 2 doses or a second dose if he has previously received 1 dose.  Human papillomavirus (HPV) vaccine. Males aged 44 21 years who have not received the vaccine previously should receive the 3-dose series. Males aged 43 26 years may be immunized. Immunization is recommended through the age of 50 years for any male who has sex with males and did not get any or all doses earlier. Immunization is recommended for any person with an immunocompromised condition through the age of 23 years if he did not get any or all doses earlier. During the 3-dose series, the second dose should be obtained 4 8 weeks after the first dose. The third dose should be obtained 24 weeks after the first dose and 16 weeks after the second dose.  Zoster vaccine. One dose is recommended for adults aged 96 years or older unless certain conditions are present.  Measles, mumps, and rubella (MMR) vaccine. Adults born before 55 generally are considered immune to measles and mumps. Adults born in [birth year] or later should have 1 or more doses of MMR vaccine unless there is a contraindication to the vaccine or there is laboratory evidence of immunity to each of the three diseases. A routine second dose of MMR vaccine should be obtained at least 28 days after the first dose for students attending postsecondary schools, health care  workers, or international travelers. People who received inactivated measles vaccine or an unknown type of measles vaccine during 1963 1967 should receive 2 doses of MMR vaccine. People who received inactivated mumps vaccine or an unknown type of mumps vaccine before 1979 and are at high risk for mumps infection should consider immunization with 2 doses of MMR vaccine. Unvaccinated health care workers born before [birth year] who lack laboratory evidence of measles, mumps, or rubella immunity or laboratory confirmation of disease should consider measles and mumps immunization with 2 doses of MMR vaccine or rubella immunization with 1 dose of MMR vaccine.  Pneumococcal 13-valent conjugate (PCV13) vaccine. When indicated, a person who is uncertain of his immunization history and has no record of immunization  should receive the PCV13 vaccine. An adult aged 67 years or older who has certain medical conditions and has not been previously immunized should receive 1 dose of PCV13 vaccine. This PCV13 should be followed with a dose of pneumococcal polysaccharide (PPSV23) vaccine. The PPSV23 vaccine dose should be obtained at least 8 weeks after the dose of PCV13 vaccine. An adult aged 79 years or older who has certain medical conditions and previously received 1 or more doses of PPSV23 vaccine should receive 1 dose of PCV13. The PCV13 vaccine dose should be obtained 1 or more years after the last PPSV23 vaccine dose.  Pneumococcal polysaccharide (PPSV23) vaccine. When PCV13 is also indicated, PCV13 should be obtained first. All adults aged 74 years and older should be immunized. An adult younger than age 50 years who has certain medical conditions should be immunized. Any person who resides in a nursing home or long-term care facility should be immunized. An adult smoker should be immunized. People with an immunocompromised condition and certain other conditions should receive both PCV13 and PPSV23 vaccines. People with human  immunodeficiency virus (HIV) infection should be immunized as soon as possible after diagnosis. Immunization during chemotherapy or radiation therapy should be avoided. Routine use of PPSV23 vaccine is not recommended for American Indians, [place] Natives, or people younger than 65 years unless there are medical conditions that require PPSV23 vaccine. When indicated, people who have unknown immunization and have no record of immunization should receive PPSV23 vaccine. One-time revaccination 5 years after the first dose of PPSV23 is recommended for people aged 41 64 years who have chronic kidney failure, nephrotic syndrome, asplenia, or immunocompromised conditions. People who received 1 2 doses of PPSV23 before age 15 years should receive another dose of PPSV23 vaccine at age 48 years or later if at least 5 years have passed since the previous dose. Doses of PPSV23 are not needed for people immunized with PPSV23 at or after age 69 years.  Meningococcal vaccine. Adults with asplenia or persistent complement component deficiencies should receive 2 doses of quadrivalent meningococcal conjugate (MenACWY-D) vaccine. The doses should be obtained at least 2 months apart. Microbiologists working with certain meningococcal bacteria, [place] recruits, people at risk during an outbreak, and people who travel to or live in countries with a high rate of meningitis should be immunized. A first-year college student up through age 7 years who is living in a residence hall should receive a dose if he did not receive a dose on or after his 16th birthday. Adults who have certain high-risk conditions should receive one or more doses of vaccine.  Hepatitis A vaccine. Adults who wish to be protected from this disease, have certain high-risk conditions, work with hepatitis A-infected animals, work in hepatitis A research labs, or travel to or work in countries with a high rate of hepatitis A should be immunized. Adults who were  previously unvaccinated and who anticipate close contact with an international adoptee during the first 60 days after arrival in the [place] from a country with a high rate of hepatitis A should be immunized.  Hepatitis B vaccine. Adults who wish to be protected from this disease, have certain high-risk conditions, may be exposed to blood or other infectious body fluids, are household contacts or sex partners of hepatitis B positive people, are clients or workers in certain care facilities, or travel to or work in countries with a high rate of hepatitis B should be immunized.  Haemophilus influenzae type b (Hib) vaccine. A  previously unvaccinated person with asplenia or sickle cell disease or having a scheduled splenectomy should receive 1 dose of Hib vaccine. Regardless of previous immunization, a recipient of a hematopoietic stem cell transplant should receive a 3-dose series 6 12 months after his successful transplant. Hib vaccine is not recommended for adults with HIV infection. Preventive Service / Frequency Ages 62 to 3  Blood pressure check.** / Every 1 to 2 years.  Lipid and cholesterol check.** / Every 5 years beginning at age 43.  Hepatitis C blood test.** / For any individual with known risks for hepatitis C.  Skin self-exam. / Monthly.  Influenza vaccine. / Every year.  Tetanus, diphtheria, and acellular pertussis (Tdap, Td) vaccine.** / Consult your health care provider. 1 dose of Td every 10 years.  Varicella vaccine.** / Consult your health care provider.  HPV vaccine. / 3 doses over 6 months, if 48 or younger.  Measles, mumps, rubella (MMR) vaccine.** / You need at least 1 dose of MMR if you were born in 1957 or later. You may also need a second dose.  Pneumococcal 13-valent conjugate (PCV13) vaccine.** / Consult your health care provider.  Pneumococcal polysaccharide (PPSV23) vaccine.** / 1 to 2 doses if you smoke cigarettes or if you have certain  conditions.  Meningococcal vaccine.** / 1 dose if you are age 8 to 70 years and a Market researcher living in a residence hall, or have one of several medical conditions. You may also need additional booster doses.  Hepatitis A vaccine.** / Consult your health care provider.  Hepatitis B vaccine.** / Consult your health care provider.  Haemophilus influenzae type b (Hib) vaccine.** / Consult your health care provider. Ages 48 to 32  Blood pressure check.** / Every 1 to 2 years.  Lipid and cholesterol check.** / Every 5 years beginning at age 38.  Lung cancer screening. / Every year if you are aged 40 80 years and have a 30-pack-year history of smoking and currently smoke or have quit within the past 15 years. Yearly screening is stopped once you have quit smoking for at least 15 years or develop a health problem that would prevent you from having lung cancer treatment.  Fecal occult blood test (FOBT) of stool. / Every year beginning at age 4 and continuing until age 70. You may not have to do this test if you get a colonoscopy every 10 years.  Flexible sigmoidoscopy** or colonoscopy.** / Every 5 years for a flexible sigmoidoscopy or every 10 years for a colonoscopy beginning at age 76 and continuing until age 62.  Hepatitis C blood test.** / For all people born from [birth year] through 1965 and any individual with known risks for hepatitis C.  Skin self-exam. / Monthly.  Influenza vaccine. / Every year.  Tetanus, diphtheria, and acellular pertussis (Tdap/Td) vaccine.** / Consult your health care provider. 1 dose of Td every 10 years.  Varicella vaccine.** / Consult your health care provider.  Zoster vaccine.** / 1 dose for adults aged 60 years or older.  Measles, mumps, rubella (MMR) vaccine.** / You need at least 1 dose of MMR if you were born in 1957 or later. You may also need a second dose.  Pneumococcal 13-valent conjugate (PCV13) vaccine.** / Consult your health care  provider.  Pneumococcal polysaccharide (PPSV23) vaccine.** / 1 to 2 doses if you smoke cigarettes or if you have certain conditions.  Meningococcal vaccine.** / Consult your health care provider.  Hepatitis A vaccine.** / Consult your health care  provider.  Hepatitis B vaccine.** / Consult your health care provider.  Haemophilus influenzae type b (Hib) vaccine.** / Consult your health care provider. Ages 65 and over  Blood pressure check.** / Every 1 to 2 years.  Lipid and cholesterol check.**/ Every 5 years beginning at age 20.  Lung cancer screening. / Every year if you are aged 55 80 years and have a 30-pack-year history of smoking and currently smoke or have quit within the past 15 years. Yearly screening is stopped once you have quit smoking for at least 15 years or develop a health problem that would prevent you from having lung cancer treatment.  Fecal occult blood test (FOBT) of stool. / Every year beginning at age 50 and continuing until age 75. You may not have to do this test if you get a colonoscopy every 10 years.  Flexible sigmoidoscopy** or colonoscopy.** / Every 5 years for a flexible sigmoidoscopy or every 10 years for a colonoscopy beginning at age 50 and continuing until age 75.  Hepatitis C blood test.** / For all people born from 1945 through 1965 and any individual with known risks for hepatitis C.  Abdominal aortic aneurysm (AAA) screening.** / A one-time screening for ages 65 to 75 years who are current or former smokers.  Skin self-exam. / Monthly.  Influenza vaccine. / Every year.  Tetanus, diphtheria, and acellular pertussis (Tdap/Td) vaccine.** / 1 dose of Td every 10 years.  Varicella vaccine.** / Consult your health care provider.  Zoster vaccine.** / 1 dose for adults aged 60 years or older.  Pneumococcal 13-valent conjugate (PCV13) vaccine.** / Consult your health care provider.  Pneumococcal polysaccharide (PPSV23) vaccine.** / 1 dose for all  adults aged 65 years and older.  Meningococcal vaccine.** / Consult your health care provider.  Hepatitis A vaccine.** / Consult your health care provider.  Hepatitis B vaccine.** / Consult your health care provider.  Haemophilus influenzae type b (Hib) vaccine.** / Consult your health care provider. **Family history and personal history of risk and conditions may change your health care provider's recommendations. Document Released: 07/08/2001 Document Revised: 03/02/2013 Document Reviewed: 10/07/2010 [employer] Patient Information 2014 [employer].  

## 2013-06-28 NOTE — Assessment & Plan Note (Signed)
Check labs con't meds 

## 2013-06-28 NOTE — Assessment & Plan Note (Signed)
Comes and goes Pt will hold off on going to ortho

## 2013-06-28 NOTE — Assessment & Plan Note (Signed)
Per pulm 

## 2013-06-28 NOTE — Assessment & Plan Note (Signed)
Stable con't meds 

## 2013-06-28 NOTE — Assessment & Plan Note (Signed)
con't diet and exercise  

## 2013-06-28 NOTE — Progress Notes (Signed)
Pre visit review using our clinic review tool, if applicable. No additional management support is needed unless otherwise documented below in the visit note. 

## 2013-06-29 ENCOUNTER — Telehealth: Payer: Self-pay | Admitting: Family Medicine

## 2013-06-29 NOTE — Telephone Encounter (Signed)
Relevant patient education assigned to patient using Emmi. ° °

## 2013-07-04 NOTE — Telephone Encounter (Signed)
Unable to reach patient Pre-Visit.    

## 2013-07-11 ENCOUNTER — Telehealth: Payer: Self-pay | Admitting: *Deleted

## 2013-07-11 NOTE — Telephone Encounter (Signed)
UDS collected on 06/29/13 and reviewed by Dr. Etter Sjogren. Results classified as low risk by Dr. Etter Sjogren,

## 2013-08-02 ENCOUNTER — Other Ambulatory Visit: Payer: Self-pay | Admitting: Family Medicine

## 2013-08-30 ENCOUNTER — Encounter: Payer: Self-pay | Admitting: Family Medicine

## 2013-09-05 ENCOUNTER — Ambulatory Visit (INDEPENDENT_AMBULATORY_CARE_PROVIDER_SITE_OTHER): Payer: BC Managed Care – PPO | Admitting: Physician Assistant

## 2013-09-05 ENCOUNTER — Ambulatory Visit (HOSPITAL_BASED_OUTPATIENT_CLINIC_OR_DEPARTMENT_OTHER)
Admission: RE | Admit: 2013-09-05 | Discharge: 2013-09-05 | Disposition: A | Discharge status: Home / Self Care | Payer: BC Managed Care – PPO | Source: Ambulatory Visit | Attending: Physician Assistant | Admitting: Physician Assistant

## 2013-09-05 ENCOUNTER — Encounter: Payer: Self-pay | Admitting: Physician Assistant

## 2013-09-05 VITALS — BP 136/78 | HR 81 | Temp 99.2°F | Resp 18 | Ht 68.0 in | Wt 253.0 lb

## 2013-09-05 DIAGNOSIS — R059 Cough, unspecified: Secondary | ICD-10-CM | POA: Insufficient documentation

## 2013-09-05 DIAGNOSIS — J3489 Other specified disorders of nose and nasal sinuses: Secondary | ICD-10-CM | POA: Insufficient documentation

## 2013-09-05 DIAGNOSIS — R0989 Other specified symptoms and signs involving the circulatory and respiratory systems: Secondary | ICD-10-CM

## 2013-09-05 DIAGNOSIS — R05 Cough: Secondary | ICD-10-CM | POA: Insufficient documentation

## 2013-09-05 DIAGNOSIS — J209 Acute bronchitis, unspecified: Secondary | ICD-10-CM | POA: Insufficient documentation

## 2013-09-05 MED ORDER — HYDROCOD POLST-CHLORPHEN POLST 10-8 MG/5ML PO LQCR: 5.0000 mL | Freq: Two times a day (BID) | ORAL | Status: DC | PRN

## 2013-09-05 MED ORDER — DOXYCYCLINE HYCLATE 100 MG PO TABS: 100.0000 mg | ORAL_TABLET | Freq: Two times a day (BID) | ORAL | Status: AC

## 2013-09-05 NOTE — Progress Notes (Signed)
Patient presents to clinic today c/o chills, sinus pressure, PND, productive cough and fever x 1 week.  Denies SOB, excessive wheezing or chest pain.  Denies ear pain.  Endorses tooth pain. Denies recent travel or sick contact.    Past Medical History  Diagnosis Date  . Hypertension   . Anxiety   . Sleep apnea 2005    moderate-has cpap-not used in 3 yr  . Chronic cough   . Hyperlipemia   . Arthritis   . Chronic kidney disease     hx kidney stones  . Depression     Current Outpatient Prescriptions on File Prior to Visit  Medication Sig Dispense Refill  . ADVAIR DISKUS 250-50 MCG/DOSE AEPB INHALE 1 PUFF INTO THE LUNGS TWICE DAILY  1 each  5  . aspirin 81 MG tablet Take 81 mg by mouth daily.        . citalopram (CELEXA) 20 MG tablet TAKE 1 TABLET BY MOUTH EVERY DAY  30 tablet  5  . esomeprazole (NEXIUM) 40 MG capsule Take 40 mg by mouth daily.      Marland Kitchen HYDROcodone-acetaminophen (VICODIN) 5-500 MG per tablet Take 1 tablet by mouth every 6 (six) hours as needed.  30 tablet  0  . simvastatin (ZOCOR) 10 MG tablet TAKE ONE TABLET BY MOUTH EVERY NIGHT AT BEDTIME  30 tablet  2  . telmisartan (MICARDIS) 40 MG tablet TAKE ONE TABLET BY MOUTH ONCE DAILY  30 tablet  5   No current facility-administered medications on file prior to visit.    No Known Allergies  Family History  Problem Relation Age of Onset  . Pancreatic cancer    . Diabetes Mother   . Pancreatitis Mother   . Heart disease Mother     MI  . Stroke Father   . Hypertension Father   . Heart disease Father   . Heart disease Sister   . Diabetes Sister 62    dm  . Cancer Sister     renal  . Diabetes Sister   . Diabetes Sister     History   Social History  . Marital Status: Married    Spouse Name: N/A    Number of Children: N/A  . Years of Education: N/A   Occupational History  . retired    Social History Main Topics  . Smoking status: Never Smoker   . Smokeless tobacco: Never Used  . Alcohol Use: No  . Drug  Use: No  . Sexual Activity: Yes    Partners: Female   Other Topics Concern  . None   Social History Narrative   Exercise--walks a mile 5 days a week   Review of Systems - See HPI.  All other ROS are negative.  BP 136/78  Pulse 81  Temp(Src) 99.2 F (37.3 C) (Oral)  Resp 18  Ht 5\' 8"  (1.727 m)  Wt 253 lb (114.76 kg)  BMI 38.48 kg/m2  SpO2 96%  Physical Exam  Vitals reviewed. Constitutional: He is oriented to person, place, and time and well-developed, well-nourished, and in no distress.  HENT:  Head: Normocephalic and atraumatic.  Right Ear: External ear normal.  Left Ear: External ear normal.  Nose: Nose normal.  Mouth/Throat: Oropharynx is clear and moist. No oropharyngeal exudate.  + TTP of sinuses.  TM within normal limits bilaterally.  Eyes: Conjunctivae are normal. Pupils are equal, round, and reactive to light.  Neck: Neck supple.  Cardiovascular: Normal rate, regular rhythm, normal heart sounds and intact  distal pulses.   Pulmonary/Chest: Effort normal. No respiratory distress. He has no wheezes. He has rales. He exhibits no tenderness.  Lymphadenopathy:    He has no cervical adenopathy.  Neurological: He is alert and oriented to person, place, and time.  Skin: Skin is warm and dry. No rash noted.  Psychiatric: Affect normal.    Recent Results (from the past 2160 hour(s))  BASIC METABOLIC PANEL     Status: Abnormal   Collection Time    06/28/13 10:57 AM      Result Value Ref Range   Sodium 139  135 - 145 mEq/L   Potassium 4.0  3.5 - 5.1 mEq/L   Chloride 106  96 - 112 mEq/L   CO2 26  19 - 32 mEq/L   Glucose, Bld 115 (*) 70 - 99 mg/dL   BUN 12  6 - 23 mg/dL   Creatinine, Ser 0.9  0.4 - 1.5 mg/dL   Calcium 9.0  8.4 - 10.5 mg/dL   GFR 86.92  >60.00 mL/min  CBC WITH DIFFERENTIAL     Status: None   Collection Time    06/28/13 10:57 AM      Result Value Ref Range   WBC 4.9  4.5 - 10.5 K/uL   RBC 4.80  4.22 - 5.81 Mil/uL   Hemoglobin 15.0  13.0 - 17.0  g/dL   HCT 45.3  39.0 - 52.0 %   MCV 94.4  78.0 - 100.0 fl   MCHC 33.1  30.0 - 36.0 g/dL   RDW 13.6  11.5 - 14.6 %   Platelets 198.0  150.0 - 400.0 K/uL   Neutrophils Relative % 60.9  43.0 - 77.0 %   Lymphocytes Relative 29.4  12.0 - 46.0 %   Monocytes Relative 8.2  3.0 - 12.0 %   Eosinophils Relative 1.3  0.0 - 5.0 %   Basophils Relative 0.2  0.0 - 3.0 %   Neutro Abs 3.0  1.4 - 7.7 K/uL   Lymphs Abs 1.4  0.7 - 4.0 K/uL   Monocytes Absolute 0.4  0.1 - 1.0 K/uL   Eosinophils Absolute 0.1  0.0 - 0.7 K/uL   Basophils Absolute 0.0  0.0 - 0.1 K/uL  HEPATIC FUNCTION PANEL     Status: Abnormal   Collection Time    06/28/13 10:57 AM      Result Value Ref Range   Total Bilirubin 0.8  0.3 - 1.2 mg/dL   Bilirubin, Direct 0.1  0.0 - 0.3 mg/dL   Alkaline Phosphatase 94  39 - 117 U/L   AST 39 (*) 0 - 37 U/L   ALT 52  0 - 53 U/L   Total Protein 6.4  6.0 - 8.3 g/dL   Albumin 3.8  3.5 - 5.2 g/dL  LIPID PANEL     Status: Abnormal   Collection Time    06/28/13 10:57 AM      Result Value Ref Range   Cholesterol 119  0 - 200 mg/dL   Comment: ATP III Classification       Desirable:  < 200 mg/dL               Borderline High:  200 - 239 mg/dL          High:  > = 240 mg/dL   Triglycerides 143.0  0.0 - 149.0 mg/dL   Comment: Normal:  <150 mg/dLBorderline High:  150 - 199 mg/dL   HDL 30.50 (*) >39.00 mg/dL   VLDL 28.6  0.0 - 40.0 mg/dL   LDL Cholesterol 60  0 - 99 mg/dL   Total CHOL/HDL Ratio 4     Comment:                Men          Women1/2 Average Risk     3.4          3.3Average Risk          5.0          4.42X Average Risk          9.6          7.13X Average Risk          15.0          11.0                      TSH     Status: None   Collection Time    06/28/13 10:57 AM      Result Value Ref Range   TSH 0.77  0.35 - 5.50 uIU/mL  PSA     Status: None   Collection Time    06/28/13 10:57 AM      Result Value Ref Range   PSA 0.31  0.10 - 4.00 ng/mL  POCT URINALYSIS DIPSTICK     Status: None    Collection Time    06/28/13  4:54 PM      Result Value Ref Range   Color, UA yellow     Clarity, UA clear     Glucose, UA Neg     Bilirubin, UA Neg     Ketones, UA Neg     Spec Grav, UA >=1.030     Blood, UA Neg     pH, UA 6.0     Protein, UA Neg     Urobilinogen, UA 0.2     Nitrite, UA Neg     Leukocytes, UA Negative     Assessment/Plan: Acute bronchitis Rx Doxycycline.  Increase fluids.  Rx Tussionex.  Rales on exam that seem to clear some with coughing. Will obtain CXR to r/o pneumonia.  Plain Mucinex.  Place humidifier in bedroom.

## 2013-09-05 NOTE — Assessment & Plan Note (Signed)
Rx Doxycycline.  Increase fluids.  Rx Tussionex.  Rales on exam that seem to clear some with coughing. Will obtain CXR to r/o pneumonia.  Plain Mucinex.  Place humidifier in bedroom.

## 2013-09-05 NOTE — Patient Instructions (Signed)
Please take antibiotic as directed.  Use Tussionex for cough.  Increase fluid intake.  Rest.  Use saline nasal spray. Place a humidifier in the bedroom. Use Plain Mucinex.  Please proceed to the [facility] to rule out pneumonia.  The address is [place]  The Imaging department is on the first floor.

## 2013-09-05 NOTE — Progress Notes (Signed)
Pre visit review using our clinic review tool, if applicable. No additional management support is needed unless otherwise documented below in the visit note/SLS  

## 2013-09-13 ENCOUNTER — Other Ambulatory Visit: Payer: Self-pay | Admitting: Family Medicine

## 2013-09-13 DIAGNOSIS — M549 Dorsalgia, unspecified: Secondary | ICD-10-CM

## 2013-09-13 MED ORDER — HYDROCODONE-ACETAMINOPHEN 5-325 MG PO TABS: 1.0000 | ORAL_TABLET | Freq: Four times a day (QID) | ORAL | Status: DC | PRN

## 2013-10-10 ENCOUNTER — Telehealth: Payer: Self-pay | Admitting: Family Medicine

## 2013-10-10 NOTE — Telephone Encounter (Signed)
Refill citalopram--- ov 3-4 weeks or sooner prn

## 2013-10-10 NOTE — Telephone Encounter (Signed)
Dr Etter Sjogren would like to see this patient in office?

## 2013-10-10 NOTE — Telephone Encounter (Signed)
Patient Information:  Caller Name: Mccormick  Phone: [phone]  Patient: Taylor Harvey, Taylor Harvey  Gender: Male  DOB: Dec 10, 1950  Age: 63 Years  PCP: Rosalita Chessman.  Office Follow Up:  Does the office need to follow up with this patient?: Yes  Instructions For The Office: Pls see RN note.  RN Note:  Anxiety Attack w/ SOB, onset 5-18.  Pt woke from nap w/ SOB, feels "things on inside of his body are running faster than on the outside, jittery".  Pt denies Chest Pain. Pt is speaking full sentences. Pt has hx of Anxiety, stopped daily Citalopram 2 weeks ago.  Pt took Zanax 0.5mg  1330 on 5-18, sxs are improving. All emergent sxs ruled out per Anxiety protocol in [facility], see w/n 24 hrs d/t history of panic attacks and current episode is not resolved.  No same day appt w/ Office. Pt would like to ask Dr Etter Sjogren for a refill of Citalopram 20mg  d/t sxs are improving after Zanax but states his sxs were controlled better w/ Citalopram. Pt was last seen on 09-05-13.  Pt uses [facility] [address]  Please review RX request w/ MD and f/u w/ Pt.  Symptoms  Reason For Call & Symptoms: Anxiety Attack w/ SOB, onset 5-18  Reviewed Health History In EMR: Yes  Reviewed Medications In EMR: Yes  Reviewed Allergies In EMR: Yes  Reviewed Surgeries / Procedures: Yes  Date of Onset of Symptoms: 10/10/2013  Treatments Tried: Zanax 0.5mg   Treatments Tried Worked: No  Guideline(s) Used:  No Protocol Available - Sick Adult  Disposition Per Guideline:   Discuss with PCP and Callback by Nurse Today  Reason For Disposition Reached:   Nursing judgment  Advice Given:  N/A  Patient Will Follow Care Advice:  YES

## 2013-10-11 NOTE — Telephone Encounter (Signed)
Spoke with pharmacy and they still have refills on the citalopram. Advised to refill. Notified spouse and advised to follow up with PCP in 3-4 weeks.

## 2013-10-11 NOTE — Telephone Encounter (Signed)
Pt's wife called to less [place] know that the pharmacy has not received the script yet.

## 2013-10-31 ENCOUNTER — Other Ambulatory Visit: Payer: Self-pay | Admitting: Family Medicine

## 2013-11-03 ENCOUNTER — Encounter: Payer: Self-pay | Admitting: Cardiology

## 2013-11-03 ENCOUNTER — Ambulatory Visit (INDEPENDENT_AMBULATORY_CARE_PROVIDER_SITE_OTHER): Payer: BC Managed Care – PPO | Admitting: Cardiology

## 2013-11-03 VITALS — BP 132/84 | HR 72 | Ht 66.0 in | Wt 254.3 lb

## 2013-11-03 DIAGNOSIS — Z8249 Family history of ischemic heart disease and other diseases of the circulatory system: Secondary | ICD-10-CM

## 2013-11-03 DIAGNOSIS — R002 Palpitations: Secondary | ICD-10-CM

## 2013-11-03 NOTE — Patient Instructions (Signed)
The current medical regimen is effective;  continue present plan and medications.  Your physician has requested that you have an exercise tolerance test. For further information please visit [email or web address]. Please also follow instruction sheet, as given.  Follow up as needed. 

## 2013-11-03 NOTE — Progress Notes (Signed)
HPI The patient presents for evaluation of the palpitations.  He has had no prior cardiac history. He does report a stress test many years ago that was negative. He reports a couple of weeks ago he had increased palpitations described as jumping heart beats. This would happen at rest. However, at that time he was out of his anxiolytics. Since taking these again he has had no further tachypalpitations. He's not had any presyncope or syncope. He might get a little dyspneic climbing stairs but this is chronic. He is otherwise able to exercise 3-4 days per week without shortness of breath. He denies chest pressure, neck or arm discomfort. He's had no presyncope or syncope.  No Known Allergies  Current Outpatient Prescriptions  Medication Sig Dispense Refill  . ADVAIR DISKUS 250-50 MCG/DOSE AEPB INHALE 1 PUFF INTO THE LUNGS TWICE DAILY  1 each  5  . aspirin 81 MG tablet Take 81 mg by mouth daily.        . citalopram (CELEXA) 20 MG tablet TAKE 1 TABLET BY MOUTH EVERY DAY  30 tablet  5  . esomeprazole (NEXIUM) 40 MG capsule Take 40 mg by mouth daily.      Marland Kitchen HYDROcodone-acetaminophen (NORCO/VICODIN) 5-325 MG per tablet Take 1 tablet by mouth every 6 (six) hours as needed for moderate pain.  30 tablet  0  . simvastatin (ZOCOR) 10 MG tablet 1 tab by mouth at bedtime--labs are due now  30 tablet  0  . telmisartan (MICARDIS) 40 MG tablet TAKE ONE TABLET BY MOUTH ONCE DAILY  30 tablet  5   No current facility-administered medications for this visit.    Past Medical History  Diagnosis Date  . Hypertension   . Anxiety   . Sleep apnea 2005    moderate-has cpap-not used in 3 yr  . Hyperlipemia   . Arthritis   . Chronic kidney disease     hx kidney stones  . Depression     Past Surgical History  Procedure Laterality Date  . Trigger finger surgery  2006  . Lithotripsy      x2  . Colonoscopy    . Umbilical hernia repair  05/05/2012    Procedure: HERNIA REPAIR UMBILICAL ADULT;  Surgeon: Rolm Bookbinder, MD;  Location: [facility];  Service: General;  Laterality: N/A;  . Insertion of mesh  05/05/2012    Procedure: INSERTION OF MESH;  Surgeon: Rolm Bookbinder, MD;  Location: [facility];  Service: General;  Laterality: N/A;    Family History  Problem Relation Age of Onset  . Pancreatic cancer    . Diabetes Mother   . Pancreatitis Mother   . Heart disease Mother 65    MI, died with MI  . Stroke Father   . Hypertension Father   . Peripheral vascular disease Father     Carotid surgery  . Heart disease Sister     Arrhythmia  . Diabetes Sister 56    DM  . Cancer Sister     renal  . Diabetes Sister   . Diabetes Sister     History   Social History  . Marital Status: Married    Spouse Name: N/A    Number of Children: 6  . Years of Education: N/A   Occupational History  . retired    Social History Main Topics  . Smoking status: Never Smoker   . Smokeless tobacco: Never Used  . Alcohol Use: No  . Drug Use: No  .  Sexual Activity: Yes    Partners: Female   Other Topics Concern  . Not on file   Social History Narrative   Exercise--walks a mile 5 days a week.  One deceased child age 63.  One stepchild, 5 adopted.     ROS:As stated in the HPI and negative for all other systems.   PHYSICAL EXAM BP 132/84  Pulse 72  Ht 5\' 6"  (1.676 m)  Wt 254 lb 4.8 oz (115.35 kg)  BMI 41.06 kg/m2 GENERAL:  Well appearing HEENT:  Pupils equal round and reactive, fundi not visualized, oral mucosa unremarkable NECK:  No jugular venous distention, waveform within normal limits, carotid upstroke brisk and symmetric, no bruits, no thyromegaly LYMPHATICS:  No cervical, inguinal adenopathy LUNGS:  Clear to auscultation bilaterally BACK:  No CVA tenderness CHEST:  Unremarkable HEART:  PMI not displaced or sustained,S1 and S2 within normal limits, no S3, no S4, no clicks, no rubs, no murmurs ABD:  Flat, positive bowel sounds normal in frequency in  pitch, no bruits, no rebound, no guarding, no midline pulsatile mass, no hepatomegaly, no splenomegaly EXT:  2 plus pulses throughout, no edema, no cyanosis no clubbing SKIN:  No rashes no nodules NEURO:  Cranial nerves II through XII grossly intact, motor grossly intact throughout PSYCH:  Cognitively intact, oriented to person place and time  EKG:  Sinus rhythm, rate 72, axis within normal limits, intervals within normal limits, no acute ST-T wave changes.  11/03/2013  (artifact)   ASSESSMENT AND PLAN  PALPITATIONS:  These have been resolved .  It may have been related to anxiety.  Given the fact that symptoms have resolved no further work up is indicated.  OVERWEIGHT:  He does exercise.  His weight is on the way down.  We did discuss this.  FAMILY HISTORY OF CAD:   I will bring the patient back for a POET (Plain Old Exercise Test). This will allow me to screen for obstructive coronary disease, risk stratify and very importantly provide a prescription for exercise.

## 2013-11-26 ENCOUNTER — Other Ambulatory Visit: Payer: Self-pay | Admitting: Family Medicine

## 2013-12-02 ENCOUNTER — Ambulatory Visit (HOSPITAL_COMMUNITY)
Admission: RE | Admit: 2013-12-02 | Discharge: 2013-12-02 | Disposition: A | Discharge status: Home / Self Care | Payer: BC Managed Care – PPO | Source: Ambulatory Visit | Attending: Cardiology | Admitting: Cardiology

## 2013-12-02 ENCOUNTER — Other Ambulatory Visit: Payer: Self-pay | Admitting: Family Medicine

## 2013-12-02 DIAGNOSIS — Z8249 Family history of ischemic heart disease and other diseases of the circulatory system: Secondary | ICD-10-CM

## 2013-12-02 DIAGNOSIS — R002 Palpitations: Secondary | ICD-10-CM

## 2013-12-02 NOTE — Procedures (Signed)
Exercise Treadmill Test   Test  Exercise Tolerance Test Ordering MD: Marijo File, MD    Unique Test No: 1   Treadmill:  1  Indication for ETT: chest pain - rule out ischemia  Contraindication to ETT: No   Stress Modality: exercise - treadmill  Cardiac Imaging Performed: non   Protocol: standard Bruce - maximal  Max BP:  207/98  Max MPHR (bpm):  156 85% MPR (bpm):  132  MPHR obtained (bpm):  155 % MPHR obtained:  99  Reached 85% MPHR (min:sec):  5 Total Exercise Time (min-sec):  7  Workload in METS:  8.5 Borg Scale: 16  Reason ETT Terminated:  dyspnea    ST Segment Analysis At Rest: normal ST segments - no evidence of significant ST depression With Exercise: 1-2 mm horizontal/upsloping ST depression at peak stress  Other Information Arrhythmia:  No Angina during ETT:  absent (0) Quality of ETT:  diagnostic  ETT Interpretation:  borderline (indeterminate) with non-specific ST changes  Comments: 1-2 mm horizontal to upsloping ST depression inferolaterally at peak stress Good exercise effort given age Duke Treadmill Score of +3 Hypertensive response to exercise Clinical correlation is advised - additional testing may be necessary  Pixie Casino, MD, [facility] Attending Cardiologist [place]

## 2014-01-02 ENCOUNTER — Other Ambulatory Visit: Payer: Self-pay | Admitting: Family Medicine

## 2014-02-03 ENCOUNTER — Other Ambulatory Visit: Payer: Self-pay

## 2014-02-03 MED ORDER — SIMVASTATIN 10 MG PO TABS: ORAL_TABLET | ORAL | Status: DC

## 2014-02-03 MED ORDER — FLUTICASONE-SALMETEROL 250-50 MCG/DOSE IN AEPB: INHALATION_SPRAY | RESPIRATORY_TRACT | Status: DC

## 2014-03-03 ENCOUNTER — Encounter: Payer: Self-pay | Admitting: Family Medicine

## 2014-03-03 ENCOUNTER — Ambulatory Visit (INDEPENDENT_AMBULATORY_CARE_PROVIDER_SITE_OTHER): Payer: BC Managed Care – PPO | Admitting: Family Medicine

## 2014-03-03 VITALS — BP 124/76 | HR 78 | Temp 98.4°F | Resp 16 | Wt 250.1 lb

## 2014-03-03 DIAGNOSIS — R350 Frequency of micturition: Secondary | ICD-10-CM

## 2014-03-03 DIAGNOSIS — R300 Dysuria: Secondary | ICD-10-CM

## 2014-03-03 DIAGNOSIS — R82998 Other abnormal findings in urine: Secondary | ICD-10-CM

## 2014-03-03 DIAGNOSIS — R319 Hematuria, unspecified: Secondary | ICD-10-CM

## 2014-03-03 DIAGNOSIS — N390 Urinary tract infection, site not specified: Secondary | ICD-10-CM | POA: Insufficient documentation

## 2014-03-03 LAB — POCT URINALYSIS DIPSTICK
BILIRUBIN UA: NEGATIVE
Blood, UA: NEGATIVE
GLUCOSE UA: NEGATIVE
Ketones, UA: NEGATIVE
Nitrite, UA: NEGATIVE
Protein, UA: NEGATIVE
SPEC GRAV UA: 1.01
Urobilinogen, UA: 0.2
pH, UA: 6

## 2014-03-03 MED ORDER — CIPROFLOXACIN HCL 500 MG PO TABS: 500.0000 mg | ORAL_TABLET | Freq: Two times a day (BID) | ORAL | Status: DC

## 2014-03-03 NOTE — Patient Instructions (Signed)
Schedule a cholesterol and BP follow up in 2 weeks- this will give [place] a chance to recheck urine Start the Cipro twice daily for infection Drink plenty of fluids Call with any questions or concerns Hang in there! (Welcome back! We fixed it!)

## 2014-03-03 NOTE — Progress Notes (Signed)
   Subjective:    Patient ID: Taylor Hua., male    DOB: 1950/07/06, 63 y.o.   MRN: [id number]  Dysuria    Painful urination- sxs started 2-3 weeks ago.  + hematuria upon completion of urination.  Hx of chronic kidney stones.  + frequency, urgency, hesitancy, burning w/ urination.  No fevers.  + suprapubic pressure.  + LBP.   Review of Systems  Genitourinary: Positive for dysuria.   For ROS see HPI     Objective:   Physical Exam  Vitals reviewed. Constitutional: He appears well-developed and well-nourished. No distress.  Abdominal: Soft. Bowel sounds are normal. He exhibits no distension. There is no tenderness. There is no rebound and no guarding.  Mild LBP  Skin: Skin is warm and dry.  Psychiatric: He has a normal mood and affect. His behavior is normal. Thought content normal.          Assessment & Plan:

## 2014-03-03 NOTE — Assessment & Plan Note (Signed)
New.  Pt's sxs and UA consistent w/ infxn.  Start Cipro.  Reviewed supportive care and red flags that should prompt return.  Pt expressed understanding and is in agreement w/ plan.

## 2014-03-03 NOTE — Progress Notes (Signed)
Pre visit review using our clinic review tool, if applicable. No additional management support is needed unless otherwise documented below in the visit note. 

## 2014-03-03 NOTE — Addendum Note (Signed)
Addended by: Modena Morrow D on: 03/03/2014 03:44 PM   Modules accepted: Orders

## 2014-03-06 LAB — URINE CULTURE: Colony Count: 70000

## 2014-03-07 ENCOUNTER — Other Ambulatory Visit: Payer: Self-pay | Admitting: Family Medicine

## 2014-03-07 DIAGNOSIS — F329 Major depressive disorder, single episode, unspecified: Secondary | ICD-10-CM

## 2014-03-07 DIAGNOSIS — F32A Depression, unspecified: Secondary | ICD-10-CM

## 2014-03-07 MED ORDER — CITALOPRAM HYDROBROMIDE 20 MG PO TABS
ORAL_TABLET | ORAL | Status: DC
Start: 2014-03-07 — End: 2014-09-15

## 2014-03-07 MED ORDER — SIMVASTATIN 10 MG PO TABS: ORAL_TABLET | ORAL | Status: DC

## 2014-03-07 MED ORDER — TELMISARTAN 40 MG PO TABS: ORAL_TABLET | ORAL | Status: DC

## 2014-03-07 NOTE — Telephone Encounter (Signed)
Caller name:Aadyn Probasco Relation to JL:LVDI Call back number:[phone] [facility]acy[facility]  [address]  Reason for call: pt is needing new rx for citalopram (CELEXA) 20 MG tablet, simvastatin 10 mg, and telmisartan 40 mg.

## 2014-03-07 NOTE — Telephone Encounter (Signed)
[place] patient 

## 2014-03-10 ENCOUNTER — Other Ambulatory Visit: Payer: Self-pay

## 2014-03-17 ENCOUNTER — Ambulatory Visit (INDEPENDENT_AMBULATORY_CARE_PROVIDER_SITE_OTHER): Payer: BC Managed Care – PPO | Admitting: Family Medicine

## 2014-03-17 ENCOUNTER — Encounter: Payer: Self-pay | Admitting: Family Medicine

## 2014-03-17 VITALS — BP 120/70 | HR 71 | Temp 98.1°F | Resp 16 | Wt 252.0 lb

## 2014-03-17 DIAGNOSIS — I10 Essential (primary) hypertension: Secondary | ICD-10-CM

## 2014-03-17 DIAGNOSIS — E785 Hyperlipidemia, unspecified: Secondary | ICD-10-CM

## 2014-03-17 DIAGNOSIS — Z23 Encounter for immunization: Secondary | ICD-10-CM

## 2014-03-17 LAB — CBC WITH DIFFERENTIAL/PLATELET
Basophils Absolute: 0 10*3/uL (ref 0.0–0.1)
Basophils Relative: 0.3 % (ref 0.0–3.0)
EOS PCT: 2.3 % (ref 0.0–5.0)
Eosinophils Absolute: 0.1 10*3/uL (ref 0.0–0.7)
HEMATOCRIT: 45.4 % (ref 39.0–52.0)
Hemoglobin: 15 g/dL (ref 13.0–17.0)
LYMPHS ABS: 1.3 10*3/uL (ref 0.7–4.0)
Lymphocytes Relative: 27 % (ref 12.0–46.0)
MCHC: 32.9 g/dL (ref 30.0–36.0)
MCV: 93.6 fl (ref 78.0–100.0)
MONOS PCT: 8.9 % (ref 3.0–12.0)
Monocytes Absolute: 0.4 10*3/uL (ref 0.1–1.0)
Neutro Abs: 3 10*3/uL (ref 1.4–7.7)
Neutrophils Relative %: 61.5 % (ref 43.0–77.0)
PLATELETS: 173 10*3/uL (ref 150.0–400.0)
RBC: 4.85 Mil/uL (ref 4.22–5.81)
RDW: 13.7 % (ref 11.5–15.5)
WBC: 4.9 10*3/uL (ref 4.0–10.5)

## 2014-03-17 LAB — LIPID PANEL
CHOLESTEROL: 106 mg/dL (ref 0–200)
HDL: 26.4 mg/dL — AB (ref >39.00)
LDL CALC: 51 mg/dL (ref 0–99)
NonHDL: 79.6
TRIGLYCERIDES: 143 mg/dL (ref 0.0–149.0)
Total CHOL/HDL Ratio: 4
VLDL: 28.6 mg/dL (ref 0.0–40.0)

## 2014-03-17 LAB — HEPATIC FUNCTION PANEL
ALBUMIN: 3.5 g/dL (ref 3.5–5.2)
ALT: 33 U/L (ref 0–53)
AST: 24 U/L (ref 0–37)
Alkaline Phosphatase: 85 U/L (ref 39–117)
Bilirubin, Direct: 0.1 mg/dL (ref 0.0–0.3)
Total Bilirubin: 0.8 mg/dL (ref 0.2–1.2)
Total Protein: 6.9 g/dL (ref 6.0–8.3)

## 2014-03-17 LAB — BASIC METABOLIC PANEL
BUN: 16 mg/dL (ref 6–23)
CALCIUM: 9 mg/dL (ref 8.4–10.5)
CO2: 24 mEq/L (ref 19–32)
CREATININE: 1 mg/dL (ref 0.4–1.5)
Chloride: 107 mEq/L (ref 96–112)
GFR: 78.85 mL/min (ref >60.00)
GLUCOSE: 108 mg/dL — AB (ref 70–99)
POTASSIUM: 3.8 meq/L (ref 3.5–5.1)
Sodium: 138 mEq/L (ref 135–145)

## 2014-03-17 NOTE — Assessment & Plan Note (Signed)
Chronic problem.  Excellent control.  Asymptomatic.  Check labs.  Anticipatory guidance provided.

## 2014-03-17 NOTE — Patient Instructions (Signed)
Schedule your complete physical in 6 months We'll notify you of your lab results and make any changes if needed Keep up the good work on healthy diet and regular exercise Call with any questions or concerns Happy [place]!!!

## 2014-03-17 NOTE — Assessment & Plan Note (Signed)
Chronic problem.  Tolerating statin w/o difficulty.  Check labs.  Adjust meds prn  

## 2014-03-17 NOTE — Progress Notes (Signed)
   Subjective:    Patient ID: Taylor Hua., male    DOB: Nov 22, 1950, 63 y.o.   MRN: [id number]  HPI HTN- chronic problem, on Micardis.  No CP, SOB, HAs, visual changes, edema.  Hyperlipidemia- chronic problem, on Simvastatin.  No abd pain, N/V/D, myalgias.   Review of Systems For ROS see HPI     Objective:   Physical Exam  Vitals reviewed. Constitutional: He is oriented to person, place, and time. He appears well-developed and well-nourished. No distress.  HENT:  Head: Normocephalic and atraumatic.  Eyes: Conjunctivae and EOM are normal. Pupils are equal, round, and reactive to light.  Neck: Normal range of motion. Neck supple. No thyromegaly present.  Cardiovascular: Normal rate, regular rhythm, normal heart sounds and intact distal pulses.   No murmur heard. Pulmonary/Chest: Effort normal and breath sounds normal. No respiratory distress.  Abdominal: Soft. Bowel sounds are normal. He exhibits no distension.  Musculoskeletal: He exhibits no edema.  Lymphadenopathy:    He has no cervical adenopathy.  Neurological: He is alert and oriented to person, place, and time. No cranial nerve deficit.  Skin: Skin is warm and dry.  Psychiatric: He has a normal mood and affect. His behavior is normal.          Assessment & Plan:

## 2014-03-17 NOTE — Progress Notes (Signed)
Pre visit review using our clinic review tool, if applicable. No additional management support is needed unless otherwise documented below in the visit note. 

## 2014-04-07 ENCOUNTER — Other Ambulatory Visit: Payer: Self-pay | Admitting: Family Medicine

## 2014-07-17 ENCOUNTER — Telehealth: Payer: Self-pay

## 2014-07-17 NOTE — Telephone Encounter (Signed)
Medication List and allergies:  Reviewed and updated  90 day supply/mail order: none Local prescriptions: [facility] [address]  Immunizations due: Pneumonia   A/P:   Flu vaccine--03/17/2014 Tdap--2008 PNA--due Shingles--2013 CCS--2008--per Dr Olevia Perches next due 2018 PSA--08/2012--0.65 within range  To Discuss with Provider: Nothing at this time

## 2014-07-19 ENCOUNTER — Ambulatory Visit (INDEPENDENT_AMBULATORY_CARE_PROVIDER_SITE_OTHER): Payer: PPO | Admitting: Family Medicine

## 2014-07-19 ENCOUNTER — Encounter: Payer: Self-pay | Admitting: Family Medicine

## 2014-07-19 VITALS — BP 118/70 | HR 63 | Temp 98.1°F | Resp 16 | Ht 67.5 in | Wt 250.2 lb

## 2014-07-19 DIAGNOSIS — I10 Essential (primary) hypertension: Secondary | ICD-10-CM

## 2014-07-19 DIAGNOSIS — R399 Unspecified symptoms and signs involving the genitourinary system: Secondary | ICD-10-CM | POA: Insufficient documentation

## 2014-07-19 DIAGNOSIS — Z23 Encounter for immunization: Secondary | ICD-10-CM

## 2014-07-19 DIAGNOSIS — Z0000 Encounter for general adult medical examination without abnormal findings: Secondary | ICD-10-CM

## 2014-07-19 DIAGNOSIS — G4700 Insomnia, unspecified: Secondary | ICD-10-CM | POA: Insufficient documentation

## 2014-07-19 DIAGNOSIS — G4730 Sleep apnea, unspecified: Secondary | ICD-10-CM

## 2014-07-19 DIAGNOSIS — E785 Hyperlipidemia, unspecified: Secondary | ICD-10-CM

## 2014-07-19 DIAGNOSIS — R350 Frequency of micturition: Secondary | ICD-10-CM

## 2014-07-19 LAB — HEPATIC FUNCTION PANEL
ALBUMIN: 4 g/dL (ref 3.5–5.2)
ALT: 24 U/L (ref 0–53)
AST: 17 U/L (ref 0–37)
Alkaline Phosphatase: 97 U/L (ref 39–117)
Bilirubin, Direct: 0.1 mg/dL (ref 0.0–0.3)
Total Bilirubin: 0.4 mg/dL (ref 0.2–1.2)
Total Protein: 6.2 g/dL (ref 6.0–8.3)

## 2014-07-19 LAB — LIPID PANEL
Cholesterol: 97 mg/dL (ref 0–200)
HDL: 26.3 mg/dL — ABNORMAL LOW (ref >39.00)
LDL Cholesterol: 51 mg/dL (ref 0–99)
NONHDL: 70.7
Total CHOL/HDL Ratio: 4
Triglycerides: 97 mg/dL (ref 0.0–149.0)
VLDL: 19.4 mg/dL (ref 0.0–40.0)

## 2014-07-19 LAB — BASIC METABOLIC PANEL
BUN: 10 mg/dL (ref 6–23)
CALCIUM: 9.1 mg/dL (ref 8.4–10.5)
CO2: 31 mEq/L (ref 19–32)
Chloride: 107 mEq/L (ref 96–112)
Creatinine, Ser: 0.95 mg/dL (ref 0.40–1.50)
GFR: 84.53 mL/min (ref >60.00)
Glucose, Bld: 111 mg/dL — ABNORMAL HIGH (ref 70–99)
Potassium: 4.8 mEq/L (ref 3.5–5.1)
Sodium: 141 mEq/L (ref 135–145)

## 2014-07-19 LAB — CBC WITH DIFFERENTIAL/PLATELET
BASOS ABS: 0 10*3/uL (ref 0.0–0.1)
Basophils Relative: 0.5 % (ref 0.0–3.0)
EOS ABS: 0.1 10*3/uL (ref 0.0–0.7)
Eosinophils Relative: 1.9 % (ref 0.0–5.0)
HEMATOCRIT: 43.3 % (ref 39.0–52.0)
HEMOGLOBIN: 14.6 g/dL (ref 13.0–17.0)
LYMPHS ABS: 1.3 10*3/uL (ref 0.7–4.0)
LYMPHS PCT: 29 % (ref 12.0–46.0)
MCHC: 33.8 g/dL (ref 30.0–36.0)
MCV: 91.8 fl (ref 78.0–100.0)
Monocytes Absolute: 0.4 10*3/uL (ref 0.1–1.0)
Monocytes Relative: 8.6 % (ref 3.0–12.0)
Neutro Abs: 2.7 10*3/uL (ref 1.4–7.7)
Neutrophils Relative %: 60 % (ref 43.0–77.0)
Platelets: 187 10*3/uL (ref 150.0–400.0)
RBC: 4.71 Mil/uL (ref 4.22–5.81)
RDW: 13.4 % (ref 11.5–15.5)
WBC: 4.6 10*3/uL (ref 4.0–10.5)

## 2014-07-19 LAB — TSH: TSH: 0.87 u[IU]/mL (ref 0.35–4.50)

## 2014-07-19 LAB — PSA: PSA: 0.33 ng/mL (ref 0.10–4.00)

## 2014-07-19 MED ORDER — TRAZODONE HCL 50 MG PO TABS: 25.0000 mg | ORAL_TABLET | Freq: Every evening | ORAL | Status: DC | PRN

## 2014-07-19 NOTE — Progress Notes (Signed)
   Subjective:    Patient ID: Taylor Hua., male    DOB: 04-22-1951, 64 y.o.   MRN: [id number]  HPI Here today for CPE.  Risk Factors: HTN- chronic problem, on Micardis.  Good medication compliance.  Well controlled. Hyperlipidemia- chronic problem, on Simvastatin.  Good compliance. OSA- chronic problem, not wearing CPAP regularly at night.  Reports he typically does 'ok' w/o it. Physical Activity: not currently exercising, plans to restart Depression: denies current sxs.  Anxiety is controlled w/ Celexa Hearing: normal to conversational tones, mildly decreased to whispered voice ADL's: independent Cognitive: normal linear thought process, memory and attention intact Home Safety: safe at home, lives w/ wife. Height, Weight, BMI, Visual Acuity: see vitals, vision corrected to 20/20 w/ glasses Counseling: UTD on colonoscopy Living will/Healthcare POA: pt does not have either, not interested at this time Labs Ordered: See A&P Care Plan: See A&P    Review of Systems Patient reports no vision/hearing changes, anorexia, fever ,adenopathy, persistant/recurrent hoarseness, swallowing issues, chest pain, palpitations, edema, persistant/recurrent cough, hemoptysis, dyspnea (rest,exertional, paroxysmal nocturnal), gastrointestinal  bleeding (melena, rectal bleeding), abdominal pain, excessive heart burn, syncope, focal weakness, memory loss, numbness & tingling, skin/hair/nail changes, depression, anxiety, abnormal bruising/bleeding, musculoskeletal symptoms/signs.   + urinary frequency and urgency and then when pt does use restroom, 'very little comes out'.  sxs for 'a few months'.  Pt not interested in treating at this time.  + insomnia- has been using melatonin at night w/o improvement.  Difficulty falling asleep.  Does ok once he is asleep.    Objective:   Physical Exam BP 118/70 mmHg  Pulse 63  Temp(Src) 98.1 F (36.7 C) (Oral)  Resp 16  Ht 5' 7.5" (1.715 m)  Wt 250 lb 4 oz  (113.513 kg)  BMI 38.59 kg/m2  SpO2 94%  General Appearance:    Alert, cooperative, no distress, appears stated age  Head:    Normocephalic, without obvious abnormality, atraumatic  Eyes:    PERRL, conjunctiva/corneas clear, EOM's intact, fundi    benign, both eyes       Ears:    Normal TM's and external ear canals, both ears  Nose:   Nares normal, septum midline, mucosa normal, no drainage   or sinus tenderness  Throat:   Lips, mucosa, and tongue normal; teeth and gums normal  Neck:   Supple, symmetrical, trachea midline, no adenopathy;       thyroid:  No enlargement/tenderness/nodules  Back:     Symmetric, no curvature, ROM normal, no CVA tenderness  Lungs:     Clear to auscultation bilaterally, respirations unlabored  Chest wall:    No tenderness or deformity  Heart:    Regular rate and rhythm, S1 and S2 normal, no murmur, rub   or gallop  Abdomen:     Soft, non-tender, bowel sounds active all four quadrants,    no masses, no organomegaly  Genitalia:    Normal male without lesion, discharge or tenderness  Rectal:    Normal tone, normal prostate, no masses or tenderness  Extremities:   Extremities normal, atraumatic, no cyanosis or edema  Pulses:   2+ and symmetric all extremities  Skin:   Skin color, texture, turgor normal, no rashes or lesions  Lymph nodes:   Cervical, supraclavicular, and axillary nodes normal  Neurologic:   CNII-XII intact. Normal strength, sensation and reflexes      throughout          Assessment & Plan:

## 2014-07-19 NOTE — Progress Notes (Signed)
Pre visit review using our clinic review tool, if applicable. No additional management support is needed unless otherwise documented below in the visit note. 

## 2014-07-19 NOTE — Patient Instructions (Signed)
Follow up in 6 months to recheck BP and cholesterol We'll notify you of your lab results and make any changes if needed Start the Trazodone as needed for insomnia If your urinary symptoms change or worsen, please call Try and make healthy food choices and get regular exercise Call with any questions or concerns [place]!!!

## 2014-07-20 ENCOUNTER — Other Ambulatory Visit (INDEPENDENT_AMBULATORY_CARE_PROVIDER_SITE_OTHER): Payer: PPO

## 2014-07-20 DIAGNOSIS — R7309 Other abnormal glucose: Secondary | ICD-10-CM

## 2014-07-20 LAB — HEMOGLOBIN A1C: Hgb A1c MFr Bld: 6.3 % (ref 4.6–6.5)

## 2014-07-23 NOTE — Assessment & Plan Note (Signed)
Chronic problem.  Encouraged pt to use CPAP nightly as directed.  Also discussed need for exercise and weight loss.  Will follow.

## 2014-07-23 NOTE — Assessment & Plan Note (Signed)
New.  Discussed sleep hygiene as well as need to comply w/ nightly CPAP.  Will start trazodone to use as needed.  Pt expressed understanding and is in agreement w/ plan.

## 2014-07-23 NOTE — Assessment & Plan Note (Signed)
Chronic problem.  Tolerating statin w/o difficulty.  Check labs.  Adjust meds prn  

## 2014-07-23 NOTE — Assessment & Plan Note (Signed)
Pt's PE WNL w/ exception of obesity.  UTD on colonoscopy.  Written screening schedule updated and given to pt.  Check labs.  Anticipatory guidance provided.  prevnar vaccine given today.

## 2014-07-23 NOTE — Assessment & Plan Note (Signed)
Check PSA to r/o BPH.  Pt is not interested in urology referral at this time.  Will follow.

## 2014-07-23 NOTE — Assessment & Plan Note (Signed)
Chronic problem.  Well controlled.  Asymptomatic.  Check labs.  No anticipated med changes. 

## 2014-09-15 ENCOUNTER — Other Ambulatory Visit: Payer: Self-pay

## 2014-09-15 DIAGNOSIS — F32A Depression, unspecified: Secondary | ICD-10-CM

## 2014-09-15 DIAGNOSIS — F329 Major depressive disorder, single episode, unspecified: Secondary | ICD-10-CM

## 2014-09-15 MED ORDER — CITALOPRAM HYDROBROMIDE 20 MG PO TABS: ORAL_TABLET | ORAL | Status: DC

## 2014-09-18 ENCOUNTER — Other Ambulatory Visit: Payer: Self-pay | Admitting: General Practice

## 2014-09-18 MED ORDER — TELMISARTAN 40 MG PO TABS: ORAL_TABLET | ORAL | Status: DC

## 2014-10-16 ENCOUNTER — Other Ambulatory Visit: Payer: Self-pay | Admitting: Family Medicine

## 2014-10-16 NOTE — Telephone Encounter (Signed)
Med filled.  

## 2015-01-17 ENCOUNTER — Ambulatory Visit (INDEPENDENT_AMBULATORY_CARE_PROVIDER_SITE_OTHER): Payer: PPO | Admitting: Family Medicine

## 2015-01-17 ENCOUNTER — Encounter: Payer: Self-pay | Admitting: Family Medicine

## 2015-01-17 VITALS — BP 120/74 | HR 82 | Temp 98.0°F | Resp 16 | Ht 68.0 in | Wt 249.5 lb

## 2015-01-17 DIAGNOSIS — M5489 Other dorsalgia: Secondary | ICD-10-CM

## 2015-01-17 DIAGNOSIS — E785 Hyperlipidemia, unspecified: Secondary | ICD-10-CM

## 2015-01-17 DIAGNOSIS — I10 Essential (primary) hypertension: Secondary | ICD-10-CM | POA: Diagnosis not present

## 2015-01-17 DIAGNOSIS — E669 Obesity, unspecified: Secondary | ICD-10-CM

## 2015-01-17 LAB — CBC WITH DIFFERENTIAL/PLATELET
BASOS PCT: 0.4 % (ref 0.0–3.0)
Basophils Absolute: 0 10*3/uL (ref 0.0–0.1)
EOS PCT: 2.2 % (ref 0.0–5.0)
Eosinophils Absolute: 0.1 10*3/uL (ref 0.0–0.7)
HEMATOCRIT: 44.9 % (ref 39.0–52.0)
Hemoglobin: 14.9 g/dL (ref 13.0–17.0)
LYMPHS ABS: 1.4 10*3/uL (ref 0.7–4.0)
Lymphocytes Relative: 29.8 % (ref 12.0–46.0)
MCHC: 33.2 g/dL (ref 30.0–36.0)
MCV: 94.3 fl (ref 78.0–100.0)
MONOS PCT: 8.6 % (ref 3.0–12.0)
Monocytes Absolute: 0.4 10*3/uL (ref 0.1–1.0)
NEUTROS ABS: 2.7 10*3/uL (ref 1.4–7.7)
NEUTROS PCT: 59 % (ref 43.0–77.0)
PLATELETS: 160 10*3/uL (ref 150.0–400.0)
RBC: 4.76 Mil/uL (ref 4.22–5.81)
RDW: 13.4 % (ref 11.5–15.5)
WBC: 4.6 10*3/uL (ref 4.0–10.5)

## 2015-01-17 LAB — HEPATIC FUNCTION PANEL
ALK PHOS: 78 U/L (ref 39–117)
ALT: 25 U/L (ref 0–53)
AST: 18 U/L (ref 0–37)
Albumin: 4 g/dL (ref 3.5–5.2)
BILIRUBIN TOTAL: 0.5 mg/dL (ref 0.2–1.2)
Bilirubin, Direct: 0.1 mg/dL (ref 0.0–0.3)
Total Protein: 6.6 g/dL (ref 6.0–8.3)

## 2015-01-17 LAB — HEMOGLOBIN A1C: Hgb A1c MFr Bld: 6 % (ref 4.6–6.5)

## 2015-01-17 LAB — BASIC METABOLIC PANEL
BUN: 12 mg/dL (ref 6–23)
CALCIUM: 9 mg/dL (ref 8.4–10.5)
CO2: 28 meq/L (ref 19–32)
Chloride: 107 mEq/L (ref 96–112)
Creatinine, Ser: 0.93 mg/dL (ref 0.40–1.50)
GFR: 86.5 mL/min (ref >60.00)
Glucose, Bld: 101 mg/dL — ABNORMAL HIGH (ref 70–99)
Potassium: 4.1 mEq/L (ref 3.5–5.1)
SODIUM: 141 meq/L (ref 135–145)

## 2015-01-17 LAB — LIPID PANEL
Cholesterol: 97 mg/dL (ref 0–200)
HDL: 28.8 mg/dL — ABNORMAL LOW (ref >39.00)
LDL CALC: 49 mg/dL (ref 0–99)
NonHDL: 67.7
Total CHOL/HDL Ratio: 3
Triglycerides: 96 mg/dL (ref 0.0–149.0)
VLDL: 19.2 mg/dL (ref 0.0–40.0)

## 2015-01-17 MED ORDER — HYDROCODONE-ACETAMINOPHEN 5-325 MG PO TABS: 1.0000 | ORAL_TABLET | Freq: Four times a day (QID) | ORAL | Status: DC | PRN

## 2015-01-17 NOTE — Patient Instructions (Signed)
Schedule your complete physical in 6 months We'll notify you of your lab results and make any changes if needed Continue to work on healthy diet and regular exercise- you can do it! Call with any questions or concerns Happy Labor Day!!! 

## 2015-01-17 NOTE — Progress Notes (Signed)
Pre visit review using our clinic review tool, if applicable. No additional management support is needed unless otherwise documented below in the visit note. 

## 2015-01-17 NOTE — Assessment & Plan Note (Signed)
Pt continues to work on healthy diet and is attempting to get more regular exercise.  Counseled him on need for both.  Will check A1C to assess for diabetes.  Will continue to follow.

## 2015-01-17 NOTE — Progress Notes (Signed)
   Subjective:    Patient ID: Taylor Hua., male    DOB: [birth year], 64 y.o.   MRN: [id number]  HPI HTN- chronic problem, on Micardis w/ good control.  Some exercise, walking some.  No CP, SOB, HAs, visual changes, edema.  Hyperlipidemia- chronic problem, on Simvastatin. No abd pain, N/V, myalgias.    Review of Systems For ROS see HPI     Objective:   Physical Exam  Constitutional: He is oriented to person, place, and time. He appears well-developed and well-nourished. No distress.  obese  HENT:  Head: Normocephalic and atraumatic.  Eyes: Conjunctivae and EOM are normal. Pupils are equal, round, and reactive to light.  Neck: Normal range of motion. Neck supple. No thyromegaly present.  Cardiovascular: Normal rate, regular rhythm, normal heart sounds and intact distal pulses.   No murmur heard. Pulmonary/Chest: Effort normal and breath sounds normal. No respiratory distress.  Abdominal: Soft. Bowel sounds are normal. He exhibits no distension.  Musculoskeletal: He exhibits no edema.  Lymphadenopathy:    He has no cervical adenopathy.  Neurological: He is alert and oriented to person, place, and time. No cranial nerve deficit.  Skin: Skin is warm and dry.  Psychiatric: He has a normal mood and affect. His behavior is normal.  Vitals reviewed.         Assessment & Plan:

## 2015-01-17 NOTE — Assessment & Plan Note (Signed)
Chronic problem.  Tolerating statin w/o difficulty.  Check labs.  Adjust meds prn  

## 2015-01-17 NOTE — Assessment & Plan Note (Signed)
Chronic problem.  Well controlled.  Asymptomatic.  Check labs.  No anticipated med changes. 

## 2015-03-21 ENCOUNTER — Other Ambulatory Visit: Payer: Self-pay | Admitting: Family Medicine

## 2015-03-21 NOTE — Telephone Encounter (Signed)
Medication filled to pharmacy as requested.   

## 2015-04-26 ENCOUNTER — Other Ambulatory Visit: Payer: Self-pay | Admitting: Family Medicine

## 2015-04-26 NOTE — Telephone Encounter (Signed)
Medication filled to pharmacy as requested.   

## 2015-05-07 ENCOUNTER — Other Ambulatory Visit: Payer: Self-pay | Admitting: Family Medicine

## 2015-05-17 ENCOUNTER — Other Ambulatory Visit: Payer: Self-pay | Admitting: Family Medicine

## 2015-05-17 NOTE — Telephone Encounter (Signed)
Medication filled to pharmacy as requested.   

## 2015-05-30 ENCOUNTER — Encounter: Payer: Self-pay | Admitting: Family Medicine

## 2015-06-01 ENCOUNTER — Ambulatory Visit (INDEPENDENT_AMBULATORY_CARE_PROVIDER_SITE_OTHER): Payer: PPO | Admitting: Family Medicine

## 2015-06-01 ENCOUNTER — Encounter: Payer: Self-pay | Admitting: Family Medicine

## 2015-06-01 VITALS — BP 125/69 | HR 73 | Temp 97.7°F | Resp 16 | Ht 68.0 in | Wt 246.6 lb

## 2015-06-01 DIAGNOSIS — H66001 Acute suppurative otitis media without spontaneous rupture of ear drum, right ear: Secondary | ICD-10-CM

## 2015-06-01 DIAGNOSIS — H6690 Otitis media, unspecified, unspecified ear: Secondary | ICD-10-CM | POA: Insufficient documentation

## 2015-06-01 MED ORDER — AMOXICILLIN 500 MG PO CAPS: 500.0000 mg | ORAL_CAPSULE | Freq: Two times a day (BID) | ORAL | Status: DC

## 2015-06-01 MED ORDER — PROMETHAZINE-DM 6.25-15 MG/5ML PO SYRP: 5.0000 mL | ORAL_SOLUTION | Freq: Four times a day (QID) | ORAL | Status: DC | PRN

## 2015-06-01 NOTE — Assessment & Plan Note (Signed)
New.  Pt's R ear w/ evidence of infxn.  Start abx.  Cough meds prn for respiratory sxs.  Reviewed supportive care and red flags that should prompt return.  Pt expressed understanding and is in agreement w/ plan.

## 2015-06-01 NOTE — Progress Notes (Signed)
   Subjective:    Patient ID: Taylor Harvey., male    DOB: 1950-09-05, 65 y.o.   MRN: [id number]  HPI URI- 'real bad cough'.  sxs started 12/22.  Had fever the 'first few days' but none recently.  Mild sinus pressure.  Cough was initially dry but in the 'last 4-5 days' it has become productive.  Mild SOB.  No N/V.  + fatigue, body aches.  No known sick contacts.  R ear pain.   Review of Systems For ROS see HPI     Objective:   Physical Exam  Constitutional: He appears well-developed and well-nourished. No distress.  HENT:  Head: Normocephalic and atraumatic.  Right Ear: Tympanic membrane is erythematous and bulging.  Left Ear: Tympanic membrane normal.  Nose: No mucosal edema or rhinorrhea. Right sinus exhibits no maxillary sinus tenderness and no frontal sinus tenderness. Left sinus exhibits no maxillary sinus tenderness and no frontal sinus tenderness.  Mouth/Throat: Mucous membranes are normal. No oropharyngeal exudate, posterior oropharyngeal edema or posterior oropharyngeal erythema.  Eyes: Conjunctivae and EOM are normal. Pupils are equal, round, and reactive to light.  Neck: Normal range of motion. Neck supple.  Cardiovascular: Normal rate, regular rhythm and normal heart sounds.   Pulmonary/Chest: Effort normal and breath sounds normal. No respiratory distress. He has no wheezes.  + hacking cough  Lymphadenopathy:    He has no cervical adenopathy.  Skin: Skin is warm and dry.  Vitals reviewed.         Assessment & Plan:

## 2015-06-01 NOTE — Patient Instructions (Signed)
Follow up as needed Start the Amoxicillin twice daily for the ear infection Drink plenty of fluids Use the cough syrup for nights/weekends- will cause drowsiness Mucinex DM for daytime cough REST! Call with any questions or concerns If you want to join [place] at the new [place] office, any scheduled appointments will automatically transfer and we will see you at [address] [place] [address], [place], [place] [address] ([place]) Happy New Year!!!

## 2015-06-01 NOTE — Progress Notes (Signed)
Pre visit review using our clinic review tool, if applicable. No additional management support is needed unless otherwise documented below in the visit note. 

## 2015-06-23 ENCOUNTER — Other Ambulatory Visit: Payer: Self-pay | Admitting: Family Medicine

## 2015-07-03 DIAGNOSIS — M545 Low back pain: Secondary | ICD-10-CM | POA: Diagnosis not present

## 2015-07-12 DIAGNOSIS — M545 Low back pain: Secondary | ICD-10-CM | POA: Diagnosis not present

## 2015-07-18 ENCOUNTER — Other Ambulatory Visit: Payer: Self-pay | Admitting: Orthopaedic Surgery

## 2015-07-18 DIAGNOSIS — M545 Low back pain: Secondary | ICD-10-CM | POA: Diagnosis not present

## 2015-07-19 DIAGNOSIS — M545 Low back pain: Secondary | ICD-10-CM | POA: Diagnosis not present

## 2015-07-25 DIAGNOSIS — M545 Low back pain: Secondary | ICD-10-CM | POA: Diagnosis not present

## 2015-07-25 DIAGNOSIS — S134XXA Sprain of ligaments of cervical spine, initial encounter: Secondary | ICD-10-CM | POA: Diagnosis not present

## 2015-07-26 ENCOUNTER — Other Ambulatory Visit: Payer: Self-pay | Admitting: Family Medicine

## 2015-07-26 NOTE — Telephone Encounter (Signed)
Medication filled to pharmacy as requested.   

## 2015-08-08 ENCOUNTER — Other Ambulatory Visit: Payer: Self-pay | Admitting: Family Medicine

## 2015-08-08 NOTE — Telephone Encounter (Signed)
Medication filled to pharmacy as requested.   

## 2015-08-08 NOTE — Telephone Encounter (Signed)
Last OV ear pain 06/01/15, Last CPE 06/2014 Trazodone last filled 07/19/14 #30 with 3

## 2015-10-01 DIAGNOSIS — M545 Low back pain: Secondary | ICD-10-CM | POA: Diagnosis not present

## 2015-10-08 ENCOUNTER — Other Ambulatory Visit: Payer: Self-pay | Admitting: Orthopaedic Surgery

## 2015-10-08 DIAGNOSIS — M545 Low back pain: Secondary | ICD-10-CM

## 2015-10-14 ENCOUNTER — Ambulatory Visit
Admission: RE | Admit: 2015-10-14 | Discharge: 2015-10-14 | Disposition: A | Discharge status: Home / Self Care | Payer: PPO | Source: Ambulatory Visit | Attending: Orthopaedic Surgery | Admitting: Orthopaedic Surgery

## 2015-10-14 DIAGNOSIS — M545 Low back pain: Secondary | ICD-10-CM | POA: Diagnosis not present

## 2015-10-15 DIAGNOSIS — M545 Low back pain: Secondary | ICD-10-CM | POA: Diagnosis not present

## 2015-10-31 DIAGNOSIS — M545 Low back pain: Secondary | ICD-10-CM | POA: Diagnosis not present

## 2015-11-07 DIAGNOSIS — M545 Low back pain: Secondary | ICD-10-CM | POA: Diagnosis not present

## 2015-11-30 ENCOUNTER — Other Ambulatory Visit: Payer: Self-pay | Admitting: Family Medicine

## 2015-12-03 ENCOUNTER — Other Ambulatory Visit: Payer: Self-pay | Admitting: Family Medicine

## 2015-12-03 NOTE — Telephone Encounter (Signed)
Medication filled to pharmacy as requested.   

## 2015-12-17 ENCOUNTER — Other Ambulatory Visit: Payer: Self-pay | Admitting: Family Medicine

## 2015-12-17 NOTE — Telephone Encounter (Signed)
Medication filled to pharmacy as requested.   

## 2016-01-07 ENCOUNTER — Encounter: Payer: Self-pay | Admitting: Family Medicine

## 2016-01-07 ENCOUNTER — Ambulatory Visit (INDEPENDENT_AMBULATORY_CARE_PROVIDER_SITE_OTHER): Payer: PPO | Admitting: Family Medicine

## 2016-01-07 VITALS — BP 124/71 | HR 81 | Temp 98.1°F | Resp 16 | Ht 68.0 in | Wt 254.0 lb

## 2016-01-07 DIAGNOSIS — E785 Hyperlipidemia, unspecified: Secondary | ICD-10-CM

## 2016-01-07 DIAGNOSIS — I10 Essential (primary) hypertension: Secondary | ICD-10-CM

## 2016-01-07 DIAGNOSIS — E669 Obesity, unspecified: Secondary | ICD-10-CM | POA: Diagnosis not present

## 2016-01-07 DIAGNOSIS — Z23 Encounter for immunization: Secondary | ICD-10-CM

## 2016-01-07 LAB — BASIC METABOLIC PANEL
BUN: 14 mg/dL (ref 6–23)
CHLORIDE: 108 meq/L (ref 96–112)
CO2: 26 mEq/L (ref 19–32)
CREATININE: 0.94 mg/dL (ref 0.40–1.50)
Calcium: 9 mg/dL (ref 8.4–10.5)
GFR: 85.19 mL/min (ref >60.00)
Glucose, Bld: 120 mg/dL — ABNORMAL HIGH (ref 70–99)
POTASSIUM: 4.4 meq/L (ref 3.5–5.1)
Sodium: 141 mEq/L (ref 135–145)

## 2016-01-07 LAB — CBC WITH DIFFERENTIAL/PLATELET
BASOS PCT: 0.6 % (ref 0.0–3.0)
Basophils Absolute: 0 10*3/uL (ref 0.0–0.1)
EOS PCT: 3.3 % (ref 0.0–5.0)
Eosinophils Absolute: 0.1 10*3/uL (ref 0.0–0.7)
HEMATOCRIT: 44.6 % (ref 39.0–52.0)
HEMOGLOBIN: 15.3 g/dL (ref 13.0–17.0)
LYMPHS PCT: 30 % (ref 12.0–46.0)
Lymphs Abs: 1.2 10*3/uL (ref 0.7–4.0)
MCHC: 34.3 g/dL (ref 30.0–36.0)
MCV: 91.7 fl (ref 78.0–100.0)
Monocytes Absolute: 0.5 10*3/uL (ref 0.1–1.0)
Monocytes Relative: 11.9 % (ref 3.0–12.0)
NEUTROS ABS: 2.2 10*3/uL (ref 1.4–7.7)
Neutrophils Relative %: 54.2 % (ref 43.0–77.0)
PLATELETS: 157 10*3/uL (ref 150.0–400.0)
RBC: 4.87 Mil/uL (ref 4.22–5.81)
RDW: 13.3 % (ref 11.5–15.5)
WBC: 4.1 10*3/uL (ref 4.0–10.5)

## 2016-01-07 LAB — LIPID PANEL
CHOL/HDL RATIO: 4
Cholesterol: 110 mg/dL (ref 0–200)
HDL: 29.9 mg/dL — ABNORMAL LOW (ref >39.00)
LDL CALC: 55 mg/dL (ref 0–99)
NONHDL: 79.64
TRIGLYCERIDES: 122 mg/dL (ref 0.0–149.0)
VLDL: 24.4 mg/dL (ref 0.0–40.0)

## 2016-01-07 LAB — HEPATIC FUNCTION PANEL
ALT: 26 U/L (ref 0–53)
AST: 17 U/L (ref 0–37)
Albumin: 4.1 g/dL (ref 3.5–5.2)
Alkaline Phosphatase: 89 U/L (ref 39–117)
BILIRUBIN DIRECT: 0.1 mg/dL (ref 0.0–0.3)
BILIRUBIN TOTAL: 0.4 mg/dL (ref 0.2–1.2)
Total Protein: 6.2 g/dL (ref 6.0–8.3)

## 2016-01-07 NOTE — Patient Instructions (Signed)
Schedule your complete physical in 6 months We'll notify you of your lab results and make any changes if needed Continue to work on healthy diet and regular exercise- you can do it! Call with any questions or concerns Enjoy the rest of your summer! 

## 2016-01-07 NOTE — Assessment & Plan Note (Signed)
Chronic problem.  Well controlled.  Asymptomatic w/ exception of deconditioning.  Stressed need for healthy diet and regular exercise.  Check labs.  No anticipated med changes.

## 2016-01-07 NOTE — Progress Notes (Signed)
   Subjective:    Patient ID: [name], male    DOB: December 15, 1950, 65 y.o.   MRN: [id number]  HPI HTN- chronic problem, on Telmisartan daily w/ good control.  Denies CP, SOB above baseline, HAs, visual changes, edema.  Hyperlipidemia- chronic problem, on Simvastatin daily.  Pt has gained 7 lbs since last visit.  Denies abd pain, N/V, myalgias  Obesity- ongoing issue for pt.  He gained 7 lbs since last visit.  Pt admits he has not been exercising recently.   Review of Systems For ROS see HPI     Objective:   Physical Exam  Constitutional: He is oriented to person, place, and time. He appears well-developed and well-nourished. No distress.  obese  HENT:  Head: Normocephalic and atraumatic.  Eyes: Conjunctivae and EOM are normal. Pupils are equal, round, and reactive to light.  Neck: Normal range of motion. Neck supple. No thyromegaly present.  Cardiovascular: Normal rate, regular rhythm, normal heart sounds and intact distal pulses.   No murmur heard. Pulmonary/Chest: Effort normal and breath sounds normal. No respiratory distress.  Abdominal: Soft. Bowel sounds are normal. He exhibits no distension.  Musculoskeletal: He exhibits no edema.  Lymphadenopathy:    He has no cervical adenopathy.  Neurological: He is alert and oriented to person, place, and time. No cranial nerve deficit.  Skin: Skin is warm and dry.  Psychiatric: He has a normal mood and affect. His behavior is normal.  Vitals reviewed.         Assessment & Plan:

## 2016-01-07 NOTE — Addendum Note (Signed)
Addended by: Davis Gourd on: 01/07/2016 09:26 AM   Modules accepted: Orders

## 2016-01-07 NOTE — Assessment & Plan Note (Signed)
Chronic problem.  Tolerating statin w/o difficulty.  Check labs.  Adjust meds prn  

## 2016-01-07 NOTE — Progress Notes (Signed)
Pre visit review using our clinic review tool, if applicable. No additional management support is needed unless otherwise documented below in the visit note. 

## 2016-01-07 NOTE — Assessment & Plan Note (Signed)
Chronic problem.  Pt has gained 7 lbs since last visit.  Stressed need for healthy diet and regular exercise.  Pt is agreeable to work on this.  Check labs to risk stratify.  Will follow.

## 2016-01-08 ENCOUNTER — Other Ambulatory Visit (INDEPENDENT_AMBULATORY_CARE_PROVIDER_SITE_OTHER): Payer: PPO

## 2016-01-08 DIAGNOSIS — R7309 Other abnormal glucose: Secondary | ICD-10-CM | POA: Diagnosis not present

## 2016-01-08 LAB — HEMOGLOBIN A1C: Hgb A1c MFr Bld: 6.1 % (ref 4.6–6.5)

## 2016-01-14 ENCOUNTER — Other Ambulatory Visit: Payer: Self-pay | Admitting: Family Medicine

## 2016-02-04 ENCOUNTER — Encounter: Payer: Self-pay | Admitting: Family Medicine

## 2016-02-05 ENCOUNTER — Other Ambulatory Visit: Payer: Self-pay | Admitting: Family Medicine

## 2016-03-05 ENCOUNTER — Ambulatory Visit (INDEPENDENT_AMBULATORY_CARE_PROVIDER_SITE_OTHER): Payer: PPO | Admitting: Orthopaedic Surgery

## 2016-03-05 DIAGNOSIS — M7712 Lateral epicondylitis, left elbow: Secondary | ICD-10-CM | POA: Diagnosis not present

## 2016-04-01 ENCOUNTER — Encounter: Payer: Self-pay | Admitting: Gastroenterology

## 2016-04-03 ENCOUNTER — Other Ambulatory Visit: Payer: Self-pay | Admitting: Family Medicine

## 2016-04-09 ENCOUNTER — Other Ambulatory Visit: Payer: Self-pay | Admitting: Family Medicine

## 2016-06-23 ENCOUNTER — Encounter: Payer: Self-pay | Admitting: Gastroenterology

## 2016-07-10 ENCOUNTER — Other Ambulatory Visit: Payer: Self-pay | Admitting: Family Medicine

## 2016-07-21 ENCOUNTER — Ambulatory Visit (INDEPENDENT_AMBULATORY_CARE_PROVIDER_SITE_OTHER): Payer: PPO | Admitting: Family Medicine

## 2016-07-21 ENCOUNTER — Encounter: Payer: Self-pay | Admitting: Family Medicine

## 2016-07-21 VITALS — BP 124/76 | HR 78 | Temp 98.1°F | Resp 16 | Ht 68.0 in | Wt 259.5 lb

## 2016-07-21 DIAGNOSIS — Z125 Encounter for screening for malignant neoplasm of prostate: Secondary | ICD-10-CM | POA: Diagnosis not present

## 2016-07-21 DIAGNOSIS — Z1159 Encounter for screening for other viral diseases: Secondary | ICD-10-CM

## 2016-07-21 DIAGNOSIS — Z0000 Encounter for general adult medical examination without abnormal findings: Secondary | ICD-10-CM

## 2016-07-21 DIAGNOSIS — E785 Hyperlipidemia, unspecified: Secondary | ICD-10-CM | POA: Diagnosis not present

## 2016-07-21 DIAGNOSIS — I10 Essential (primary) hypertension: Secondary | ICD-10-CM | POA: Diagnosis not present

## 2016-07-21 LAB — LIPID PANEL
CHOLESTEROL: 107 mg/dL (ref 0–200)
HDL: 31.1 mg/dL — AB (ref >39.00)
LDL Cholesterol: 52 mg/dL (ref 0–99)
NonHDL: 75.57
Total CHOL/HDL Ratio: 3
Triglycerides: 116 mg/dL (ref 0.0–149.0)
VLDL: 23.2 mg/dL (ref 0.0–40.0)

## 2016-07-21 LAB — HEPATIC FUNCTION PANEL
ALK PHOS: 87 U/L (ref 39–117)
ALT: 32 U/L (ref 0–53)
AST: 20 U/L (ref 0–37)
Albumin: 4.1 g/dL (ref 3.5–5.2)
BILIRUBIN DIRECT: 0.1 mg/dL (ref 0.0–0.3)
TOTAL PROTEIN: 6.1 g/dL (ref 6.0–8.3)
Total Bilirubin: 0.4 mg/dL (ref 0.2–1.2)

## 2016-07-21 LAB — BASIC METABOLIC PANEL
BUN: 13 mg/dL (ref 6–23)
CALCIUM: 9.1 mg/dL (ref 8.4–10.5)
CO2: 30 meq/L (ref 19–32)
Chloride: 106 mEq/L (ref 96–112)
Creatinine, Ser: 0.98 mg/dL (ref 0.40–1.50)
GFR: 81.05 mL/min (ref >60.00)
GLUCOSE: 119 mg/dL — AB (ref 70–99)
Potassium: 4.8 mEq/L (ref 3.5–5.1)
SODIUM: 141 meq/L (ref 135–145)

## 2016-07-21 LAB — CBC WITH DIFFERENTIAL/PLATELET
BASOS ABS: 0 10*3/uL (ref 0.0–0.1)
Basophils Relative: 0.7 % (ref 0.0–3.0)
EOS ABS: 0.2 10*3/uL (ref 0.0–0.7)
Eosinophils Relative: 3.2 % (ref 0.0–5.0)
HCT: 45.8 % (ref 39.0–52.0)
Hemoglobin: 15.4 g/dL (ref 13.0–17.0)
LYMPHS ABS: 1.4 10*3/uL (ref 0.7–4.0)
LYMPHS PCT: 28.4 % (ref 12.0–46.0)
MCHC: 33.6 g/dL (ref 30.0–36.0)
MCV: 94.2 fl (ref 78.0–100.0)
MONO ABS: 0.5 10*3/uL (ref 0.1–1.0)
Monocytes Relative: 10.5 % (ref 3.0–12.0)
NEUTROS ABS: 2.7 10*3/uL (ref 1.4–7.7)
NEUTROS PCT: 57.2 % (ref 43.0–77.0)
PLATELETS: 169 10*3/uL (ref 150.0–400.0)
RBC: 4.86 Mil/uL (ref 4.22–5.81)
RDW: 13.4 % (ref 11.5–15.5)
WBC: 4.8 10*3/uL (ref 4.0–10.5)

## 2016-07-21 LAB — PSA, MEDICARE: PSA: 0.27 ng/mL (ref 0.10–4.00)

## 2016-07-21 LAB — TSH: TSH: 1.39 u[IU]/mL (ref 0.35–4.50)

## 2016-07-21 NOTE — Patient Instructions (Signed)
Follow up in 6 months to recheck BP and cholesterol We'll notify you of your lab results and make any changes if needed Continue to work on healthy diet and regular exercise- you can do it! I'm proud of you for scheduling your colonoscopy!!! You are up to date on immunizations- great job! Call with any questions or concerns Happy Spring!!!

## 2016-07-21 NOTE — Assessment & Plan Note (Signed)
Chronic problem.  Tolerating statin w/o difficulty.  Check labs.  Adjust meds prn  

## 2016-07-21 NOTE — Assessment & Plan Note (Signed)
Pt's PE WNL w/ exception of obesity.  Has colonoscopy scheduled- applauded his efforts.  UTD on immunizations.  Stressed need for healthy diet and regular exercise.  Check labs.  Anticipatory guidance provided.

## 2016-07-21 NOTE — Progress Notes (Signed)
Pre visit review using our clinic review tool, if applicable. No additional management support is needed unless otherwise documented below in the visit note. 

## 2016-07-21 NOTE — Assessment & Plan Note (Signed)
Chronic problem.  Well controlled today.  Check labs.  No anticipated med changes.  Will follow. 

## 2016-07-21 NOTE — Progress Notes (Signed)
   Subjective:    Patient ID: Taylor Hua., male    DOB: Jan 12, 1951, 66 y.o.   MRN: [id number]  HPI Here today for CPE.  Risk Factors: HTN- chronic problem, on telmisartan daily w/ good control Hyperlipidemia- chronic problem, on Simvastatin daily w/o difficulty. Physical Activity: not currently exercising Fall Risk: low Depression: denies, well controlled on Celexa Hearing: normal to conversational tones, whispered voice at 6 ft ADL's: independent Cognitive: normal linear thought process, memory and attention intact Home Safety: safe at home Height, Weight, BMI, Visual Acuity: see vitals, vision corrected to 20/20 w/ glasses Counseling: colonoscopy scheduled 3/21, UTD on immunizations Care team reviewed and updated w/ pt Labs Ordered: See A&P Care Plan: See A&P    Review of Systems Patient reports no vision/hearing changes, anorexia, fever ,adenopathy, persistant/recurrent hoarseness, swallowing issues, chest pain, palpitations, edema, persistant/recurrent cough, hemoptysis, dyspnea (rest,exertional, paroxysmal nocturnal), gastrointestinal  bleeding (melena, rectal bleeding), abdominal pain, excessive heart burn, GU symptoms (dysuria, hematuria, voiding/incontinence issues) syncope, focal weakness, memory loss, numbness & tingling, skin/hair/nail changes, depression, anxiety, abnormal bruising/bleeding, musculoskeletal symptoms/signs.     Objective:   Physical Exam BP 124/76   Pulse 78   Temp 98.1 F (36.7 C) (Oral)   Resp 16   Ht 5\' 8"  (1.727 m)   Wt 259 lb 8 oz (117.7 kg)   SpO2 98%   BMI 39.46 kg/m   General Appearance:    Alert, cooperative, no distress, appears stated age, obese  Head:    Normocephalic, without obvious abnormality, atraumatic  Eyes:    PERRL, conjunctiva/corneas clear, EOM's intact, fundi    benign, both eyes       Ears:    Normal TM's and external ear canals, both ears  Nose:   Nares normal, septum midline, mucosa normal, no drainage   or  sinus tenderness  Throat:   Lips, mucosa, and tongue normal; teeth and gums normal  Neck:   Supple, symmetrical, trachea midline, no adenopathy;       thyroid:  No enlargement/tenderness/nodules  Back:     Symmetric, no curvature, ROM normal, no CVA tenderness  Lungs:     Clear to auscultation bilaterally, respirations unlabored  Chest wall:    No tenderness or deformity  Heart:    Regular rate and rhythm, S1 and S2 normal, no murmur, rub   or gallop  Abdomen:     Soft, non-tender, bowel sounds active all four quadrants,    no masses, no organomegaly.  + diastasis recti  Genitalia:    Normal male without lesion, discharge or tenderness  Rectal:    Normal tone, normal prostate, no masses or tenderness  Extremities:   Extremities normal, atraumatic, no cyanosis or edema  Pulses:   2+ and symmetric all extremities  Skin:   Skin color, texture, turgor normal, no rashes or lesions  Lymph nodes:   Cervical, supraclavicular, and axillary nodes normal  Neurologic:   CNII-XII intact. Normal strength, sensation and reflexes      throughout          Assessment & Plan:

## 2016-07-22 ENCOUNTER — Other Ambulatory Visit (INDEPENDENT_AMBULATORY_CARE_PROVIDER_SITE_OTHER): Payer: PPO

## 2016-07-22 DIAGNOSIS — R7309 Other abnormal glucose: Secondary | ICD-10-CM

## 2016-07-22 LAB — HEPATITIS C ANTIBODY: HCV AB: NEGATIVE

## 2016-07-22 LAB — HEMOGLOBIN A1C: Hgb A1c MFr Bld: 6.4 % (ref 4.6–6.5)

## 2016-07-30 ENCOUNTER — Ambulatory Visit (AMBULATORY_SURGERY_CENTER): Payer: Self-pay

## 2016-07-30 ENCOUNTER — Encounter: Payer: Self-pay | Admitting: Gastroenterology

## 2016-07-30 VITALS — Ht 67.5 in | Wt 260.0 lb

## 2016-07-30 DIAGNOSIS — Z860100 Personal history of colon polyps, unspecified: Secondary | ICD-10-CM

## 2016-07-30 MED ORDER — SUPREP BOWEL PREP KIT 17.5-3.13-1.6 GM/177ML PO SOLN: 1.0000 | Freq: Once | ORAL | 0 refills | Status: AC

## 2016-07-30 NOTE — Progress Notes (Signed)
No allergies to eggs or soy No past problems with anesthesia No home oxygen No diet meds  Declined emmi 

## 2016-08-05 ENCOUNTER — Other Ambulatory Visit: Payer: Self-pay | Admitting: Family Medicine

## 2016-08-13 ENCOUNTER — Ambulatory Visit (AMBULATORY_SURGERY_CENTER): Payer: PPO | Admitting: Gastroenterology

## 2016-08-13 ENCOUNTER — Encounter: Payer: Self-pay | Admitting: Gastroenterology

## 2016-08-13 VITALS — BP 111/58 | HR 64 | Temp 96.6°F | Resp 12 | Ht 67.5 in | Wt 260.0 lb

## 2016-08-13 DIAGNOSIS — Z1211 Encounter for screening for malignant neoplasm of colon: Secondary | ICD-10-CM | POA: Diagnosis not present

## 2016-08-13 DIAGNOSIS — Z1212 Encounter for screening for malignant neoplasm of rectum: Secondary | ICD-10-CM

## 2016-08-13 DIAGNOSIS — I10 Essential (primary) hypertension: Secondary | ICD-10-CM | POA: Diagnosis not present

## 2016-08-13 DIAGNOSIS — Z800 Family history of malignant neoplasm of digestive organs: Secondary | ICD-10-CM

## 2016-08-13 DIAGNOSIS — G4730 Sleep apnea, unspecified: Secondary | ICD-10-CM | POA: Diagnosis not present

## 2016-08-13 DIAGNOSIS — Z86010 Personal history of colonic polyps: Secondary | ICD-10-CM | POA: Diagnosis not present

## 2016-08-13 MED ORDER — SODIUM CHLORIDE 0.9 % IV SOLN: 500.0000 mL | INTRAVENOUS | Status: DC

## 2016-08-13 NOTE — Op Note (Signed)
[facility] Patient Name: Taylor Harvey Procedure Date: 08/13/2016 11:00 AM MRN: [id number] Endoscopist: Mallie Mussel L. Loletha Carrow , MD Age: 66 Referring MD:  Date of Birth: 1950-10-04 Gender: Male Account #: [id number] Procedure:                Colonoscopy Indications:              Screening in patient at increased risk: Colorectal                            cancer in sister 66 or older (recent diagnosis).                            Patient had hyperplastic polyps on 05/2006                            colonoscopy. Medicines:                Monitored Anesthesia Care Procedure:                Pre-Anesthesia Assessment:                           - Prior to the procedure, a History and Physical                            was performed, and patient medications and                            allergies were reviewed. The patient's tolerance of                            previous anesthesia was also reviewed. The risks                            and benefits of the procedure and the sedation                            options and risks were discussed with the patient.                            All questions were answered, and informed consent                            was obtained. Anticoagulants: The patient has taken                            aspirin. It was decided not to withhold this                            medication prior to the procedure. ASA Grade                            Assessment: II - A patient with mild systemic  disease. After reviewing the risks and benefits,                            the patient was deemed in satisfactory condition to                            undergo the procedure.                           After obtaining informed consent, the colonoscope                            was passed under direct vision. Throughout the                            procedure, the patient's blood pressure, pulse, and   oxygen saturations were monitored continuously. The                            Colonoscope was introduced through the anus and                            advanced to the the cecum, identified by                            appendiceal orifice and ileocecal valve. The                            colonoscopy was performed with difficulty due to                            significant looping. The patient tolerated the                            procedure well. The quality of the bowel                            preparation was good. The ileocecal valve,                            appendiceal orifice, and rectum were photographed.                            The quality of the bowel preparation was evaluated                            using the BBPS [facility] Bowel Preparation Scale)                            with scores of: Right Colon = 2, Transverse Colon =                            3 and Left Colon = 2. The total BBPS score equals  7. The bowel preparation used was SUPREP. Scope In: 11:10:42 AM Scope Out: 11:25:49 AM Scope Withdrawal Time: 0 hours 9 minutes 49 seconds  Total Procedure Duration: 0 hours 15 minutes 7 seconds  Findings:                 The perianal and digital rectal examinations were                            normal.                           Diverticula were found in the entire colon and left                            colon.                           The colon (entire examined portion) was moderately                            redundant.                           The exam was otherwise without abnormality on                            direct and retroflexion views.                           Internal hemorrhoids were found. The hemorrhoids                            were small and Grade I (internal hemorrhoids that                            do not prolapse). Complications:            No immediate complications. Estimated Blood Loss:     Estimated  blood loss: none. Impression:               - Diverticulosis in the entire examined colon and                            in the left colon.                           - Redundant colon.                           - The examination was otherwise normal on direct                            and retroflexion views.                           - Internal hemorrhoids.                           -  No specimens collected. Recommendation:           - Patient has a contact number available for                            emergencies. The signs and symptoms of potential                            delayed complications were discussed with the                            patient. Return to normal activities tomorrow.                            Written discharge instructions were provided to the                            patient.                           - Resume previous diet.                           - Continue present medications.                           - Repeat colonoscopy in 5 years for screening                            purposes. Nygeria Lager L. Loletha Carrow, MD 08/13/2016 11:31:29 AM This report has been signed electronically.

## 2016-08-13 NOTE — Progress Notes (Signed)
Pt's states no medical or surgical changes since previsit or office visit. 

## 2016-08-13 NOTE — Patient Instructions (Addendum)
Handout given on diverticulosis   YOU HAD AN ENDOSCOPIC PROCEDURE TODAY: Refer to the procedure report and other information in the discharge instructions given to you for any specific questions about what was found during the examination. If this information does not answer your questions, please call [place] office at [phone] to clarify.   YOU SHOULD EXPECT: Some feelings of bloating in the abdomen. Passage of more gas than usual. Walking can help get rid of the air that was put into your GI tract during the procedure and reduce the bloating. If you had a lower endoscopy (such as a colonoscopy or flexible sigmoidoscopy) you may notice spotting of blood in your stool or on the toilet paper. Some abdominal soreness may be present for a day or two, also.  DIET: Your first meal following the procedure should be a light meal and then it is ok to progress to your normal diet. A half-sandwich or bowl of soup is an example of a good first meal. Heavy or fried foods are harder to digest and may make you feel nauseous or bloated. Drink plenty of fluids but you should avoid alcoholic beverages for 24 hours. If you had a esophageal dilation, please see attached instructions for diet.    ACTIVITY: Your care partner should take you home directly after the procedure. You should plan to take it easy, moving slowly for the rest of the day. You can resume normal activity the day after the procedure however YOU SHOULD NOT DRIVE, use power tools, machinery or perform tasks that involve climbing or major physical exertion for 24 hours (because of the sedation medicines used during the test).   SYMPTOMS TO REPORT IMMEDIATELY: A gastroenterologist can be reached at any hour. Please call [phone]  for any of the following symptoms:  Following lower endoscopy (colonoscopy, flexible sigmoidoscopy) Excessive amounts of blood in the stool  Significant tenderness, worsening of abdominal pains  Swelling of the abdomen  that is new, acute  Fever of 100 or higher  FOLLOW UP:  If any biopsies were taken you will be contacted by phone or by letter within the next 1-3 weeks. Call [phone]  if you have not heard about the biopsies in 3 weeks.  Please also call with any specific questions about appointments or follow up tests.   Hemorrhoid and diverticulosis information given.  Recall 5 years-2023

## 2016-08-13 NOTE — Progress Notes (Signed)
To PACU Pt awake and alert. Report to RN 

## 2016-08-14 ENCOUNTER — Telehealth: Payer: Self-pay

## 2016-08-14 NOTE — Telephone Encounter (Signed)
  Follow up Call-  Call back number 08/13/2016  Post procedure Call Back phone  # [phone]  Permission to leave phone message Yes  Some recent data might be hidden     Patient questions:  Do you have a fever, pain , or abdominal swelling? No. Pain Score  0 *  Have you tolerated food without any problems? Yes.    Have you been able to return to your normal activities? Yes.    Do you have any questions about your discharge instructions: Diet   No. Medications  No. Follow up visit  No.  Do you have questions or concerns about your Care? No.  Actions: * If pain score is 4 or above: No action needed, pain <4.

## 2016-08-18 ENCOUNTER — Other Ambulatory Visit: Payer: Self-pay | Admitting: Family Medicine

## 2016-10-07 ENCOUNTER — Other Ambulatory Visit: Payer: Self-pay | Admitting: Family Medicine

## 2016-10-09 DIAGNOSIS — H524 Presbyopia: Secondary | ICD-10-CM | POA: Diagnosis not present

## 2016-10-09 DIAGNOSIS — H2513 Age-related nuclear cataract, bilateral: Secondary | ICD-10-CM | POA: Diagnosis not present

## 2016-10-09 DIAGNOSIS — H5203 Hypermetropia, bilateral: Secondary | ICD-10-CM | POA: Diagnosis not present

## 2016-10-30 ENCOUNTER — Ambulatory Visit (INDEPENDENT_AMBULATORY_CARE_PROVIDER_SITE_OTHER): Payer: PPO | Admitting: Family Medicine

## 2016-10-30 ENCOUNTER — Encounter: Payer: Self-pay | Admitting: Family Medicine

## 2016-10-30 VITALS — BP 118/80 | HR 70 | Temp 98.2°F | Resp 17 | Ht 68.0 in | Wt 250.5 lb

## 2016-10-30 DIAGNOSIS — B9789 Other viral agents as the cause of diseases classified elsewhere: Secondary | ICD-10-CM

## 2016-10-30 DIAGNOSIS — J069 Acute upper respiratory infection, unspecified: Secondary | ICD-10-CM

## 2016-10-30 MED ORDER — GUAIFENESIN-CODEINE 100-10 MG/5ML PO SYRP: 10.0000 mL | ORAL_SOLUTION | Freq: Three times a day (TID) | ORAL | 0 refills | Status: DC | PRN

## 2016-10-30 NOTE — Patient Instructions (Signed)
Follow up as needed/scheduled This appears to be a virus and should continue to improve w/ time Drink plenty of fluids Use the cough syrup as needed- will cause drowsiness Mucinex DM for daytime cough (won't cause drowsiness) REST! Call with any questions or concerns Hang in there!

## 2016-10-30 NOTE — Progress Notes (Signed)
Pre visit review using our clinic review tool, if applicable. No additional management support is needed unless otherwise documented below in the visit note. 

## 2016-10-30 NOTE — Progress Notes (Signed)
   Subjective:    Patient ID: Taylor Hua., male    DOB: 1950/11/19, 66 y.o.   MRN: [id number]  HPI URI- sxs started 9-10 days ago.  No fevers.  'first 2-3 days of yucky feeling.  HA, jaw and teeth hurting.  Chest congestion.  Cough.  4 days of diarrhea'.  Possible sick contacts.  + sinus pain/pressure.  No ear pain.  Cough is productive.  No N/V.  Pt reports feeling better recently.   Review of Systems For ROS see HPI     Objective:   Physical Exam  Constitutional: He is oriented to person, place, and time. He appears well-developed and well-nourished. No distress.  HENT:  Head: Normocephalic and atraumatic.  Right Ear: Tympanic membrane normal.  Left Ear: Tympanic membrane normal.  Nose: No mucosal edema or rhinorrhea. Right sinus exhibits no maxillary sinus tenderness and no frontal sinus tenderness. Left sinus exhibits no maxillary sinus tenderness and no frontal sinus tenderness.  Mouth/Throat: Mucous membranes are normal. No oropharyngeal exudate, posterior oropharyngeal edema or posterior oropharyngeal erythema.  Eyes: Conjunctivae and EOM are normal. Pupils are equal, round, and reactive to light.  Neck: Normal range of motion. Neck supple.  Cardiovascular: Normal rate, regular rhythm and normal heart sounds.   Pulmonary/Chest: Effort normal and breath sounds normal. No respiratory distress. He has no wheezes.  + hacking cough  Lymphadenopathy:    He has no cervical adenopathy.  Neurological: He is alert and oriented to person, place, and time.  Skin: Skin is warm and dry.  Vitals reviewed.         Assessment & Plan:  Viral URI- new.  No evidence of bacterial infxn on PE.  Suspect that the diarrhea is part of the viral picture.  Pt reports he is feeling better than earlier in the illness.  Reviewed dx, that there is no need for abx, supportive care and red flags that should prompt return.  Cough meds prn.  Pt expressed understanding and is in agreement w/ plan.

## 2016-11-17 ENCOUNTER — Other Ambulatory Visit: Payer: Self-pay | Admitting: General Practice

## 2016-11-17 ENCOUNTER — Ambulatory Visit (INDEPENDENT_AMBULATORY_CARE_PROVIDER_SITE_OTHER): Payer: PPO | Admitting: Family Medicine

## 2016-11-17 ENCOUNTER — Encounter: Payer: Self-pay | Admitting: Family Medicine

## 2016-11-17 VITALS — BP 121/78 | HR 80 | Temp 98.2°F | Resp 16 | Ht 68.0 in | Wt 248.4 lb

## 2016-11-17 DIAGNOSIS — R05 Cough: Secondary | ICD-10-CM | POA: Diagnosis not present

## 2016-11-17 DIAGNOSIS — R058 Other specified cough: Secondary | ICD-10-CM

## 2016-11-17 MED ORDER — ALBUTEROL SULFATE HFA 108 (90 BASE) MCG/ACT IN AERS: 2.0000 | INHALATION_SPRAY | Freq: Four times a day (QID) | RESPIRATORY_TRACT | 2 refills | Status: DC | PRN

## 2016-11-17 MED ORDER — PREDNISONE 10 MG PO TABS: ORAL_TABLET | ORAL | 0 refills | Status: DC

## 2016-11-17 MED ORDER — ALBUTEROL SULFATE HFA 108 (90 BASE) MCG/ACT IN AERS: 2.0000 | INHALATION_SPRAY | Freq: Four times a day (QID) | RESPIRATORY_TRACT | 1 refills | Status: DC | PRN

## 2016-11-17 NOTE — Progress Notes (Signed)
   Subjective:    Patient ID: Taylor Hua., male    DOB: 1950-12-25, 66 y.o.   MRN: [id number]  HPI Cough- pt continues to cough since his visit on 6/7.  Has used prescription cough syrup, OTC Mucinex w/o relief.  Pt reports feeling well w/ exception of cough.  Pt will start coughing w/ talking, laughing.  Denies SOB, fever, chills, N/V, nasal congestion, ear pain, HA.   Review of Systems For ROS see HPI     Objective:   Physical Exam  Constitutional: He is oriented to person, place, and time. He appears well-developed and well-nourished. No distress.  HENT:  Head: Normocephalic and atraumatic.  Right Ear: Tympanic membrane normal.  Left Ear: Tympanic membrane normal.  Nose: No mucosal edema or rhinorrhea. Right sinus exhibits no maxillary sinus tenderness and no frontal sinus tenderness. Left sinus exhibits no maxillary sinus tenderness and no frontal sinus tenderness.  Mouth/Throat: Mucous membranes are normal. No oropharyngeal exudate, posterior oropharyngeal edema or posterior oropharyngeal erythema.  Eyes: Conjunctivae and EOM are normal. Pupils are equal, round, and reactive to light.  Neck: Normal range of motion. Neck supple.  Cardiovascular: Normal rate, regular rhythm and normal heart sounds.   Pulmonary/Chest: Effort normal and breath sounds normal. No respiratory distress. He has no wheezes.  Rare cough during exam  Lymphadenopathy:    He has no cervical adenopathy.  Neurological: He is alert and oriented to person, place, and time.  Skin: Skin is warm and dry.  Psychiatric: He has a normal mood and affect. His behavior is normal. Thought content normal.  Vitals reviewed.         Assessment & Plan:  Post- infectious cough- ongoing.  Pt is feeling well w/ exception of dry, hacking cough consistent w/ post-viral cough.  No evidence of volume overload or abnormality on PE to suggest cardiac cough.  Start prednisone taper.  Albuterol prn.  Reviewed supportive care  and red flags that should prompt return.  Pt expressed understanding and is in agreement w/ plan.

## 2016-11-17 NOTE — Progress Notes (Signed)
Pre visit review using our clinic review tool, if applicable. No additional management support is needed unless otherwise documented below in the visit note. 

## 2016-11-17 NOTE — Patient Instructions (Signed)
Follow up as needed/scheduled Start the Prednisone as directed- take w/ food (3 pills at the same time x3 days, then 2 pills at the same time x3 days, then 1 pill x3 days) Drink plenty of fluids If you have a coughing fit, use the Albuterol inhaler- 2 puffs every 4-6 hrs as needed Call with any questions or concerns Hang in there!

## 2016-12-10 ENCOUNTER — Other Ambulatory Visit: Payer: Self-pay | Admitting: Family Medicine

## 2016-12-22 ENCOUNTER — Encounter: Payer: Self-pay | Admitting: Family Medicine

## 2016-12-22 ENCOUNTER — Telehealth: Payer: Self-pay | Admitting: Family Medicine

## 2016-12-22 ENCOUNTER — Ambulatory Visit (INDEPENDENT_AMBULATORY_CARE_PROVIDER_SITE_OTHER): Payer: PPO | Admitting: Family Medicine

## 2016-12-22 VITALS — BP 134/74 | HR 74 | Temp 98.1°F | Resp 16 | Ht 68.0 in | Wt 251.2 lb

## 2016-12-22 DIAGNOSIS — H8112 Benign paroxysmal vertigo, left ear: Secondary | ICD-10-CM | POA: Diagnosis not present

## 2016-12-22 MED ORDER — MECLIZINE HCL 25 MG PO TABS
25.0000 mg | ORAL_TABLET | Freq: Three times a day (TID) | ORAL | 0 refills | Status: DC | PRN
Start: 2016-12-22 — End: 2017-02-16

## 2016-12-22 NOTE — Patient Instructions (Signed)
Follow up as needed/scheduled Start the Meclizine as needed for dizziness Drink plenty of fluids Change positions slowly Start daily Claritin or Zyrtec to improve the fluid behind the ears Call with any questions or concerns- particularly if not improving Hang in there!!!

## 2016-12-22 NOTE — Progress Notes (Signed)
Pre visit review using our clinic review tool, if applicable. No additional management support is needed unless otherwise documented below in the visit note. 

## 2016-12-22 NOTE — Progress Notes (Signed)
   Subjective:    Patient ID: Taylor Harvey., male    DOB: [birth year], 66 y.o.   MRN: [id number]  HPI Dizziness- sxs started Friday night right before bed when turning quickly.  Saturday after breakfast, he developed dizziness and nausea driving home after checking blind spot.  Had blurry vision on Saturday- 'like when you wake up and need to rub your eyes'.  Continues to have dizziness when turning over in bed or changing positions.  Dizziness w/ turning head.  No sinus congestion or pressure.  Prior to sxs starting he had intermittent ear pain.   Review of Systems For ROS see HPI     Objective:   Physical Exam  Constitutional: He is oriented to person, place, and time. He appears well-developed and well-nourished. No distress.  HENT:  Head: Normocephalic and atraumatic.  Right Ear: External ear normal.  Left Ear: External ear normal.  Nose: Nose normal.  Mouth/Throat: Oropharynx is clear and moist. No oropharyngeal exudate.  4-5 beats of horizontal nystagmus when looking L Fluid behind TMs bilaterally  Eyes: Pupils are equal, round, and reactive to light. Conjunctivae and EOM are normal.  Neck: Normal range of motion. Neck supple. No thyromegaly present.  Cardiovascular: Normal rate, regular rhythm, normal heart sounds and intact distal pulses.   Pulmonary/Chest: Effort normal and breath sounds normal. No respiratory distress. He has no wheezes. He has no rales.  Lymphadenopathy:    He has no cervical adenopathy.  Neurological: He is alert and oriented to person, place, and time. He has normal reflexes. No cranial nerve deficit. Coordination normal.  Skin: Skin is warm and dry.  Psychiatric: He has a normal mood and affect. His behavior is normal.  Vitals reviewed.         Assessment & Plan:  BPV- new. Pt's sxs and PE consistent w/ BPV.  Reviewed dx and tx.  Start Meclizine.  Pt declined Zofran at this time.  Start OTC antihistamine to decrease fluid behind TMs.   Reviewed supportive care and red flags that should prompt return.  Pt expressed understanding and is in agreement w/ plan.

## 2016-12-22 NOTE — Telephone Encounter (Signed)
Patient Name: Taylor Harvey DOB: [birth year] Initial Comment Caller states has been having dizziness, headaches, and blurred vision Nurse Assessment Nurse: Ronnald Ramp, RN, Miranda Date/Time (Eastern Time): 12/22/2016 8:27:57 AM Confirm and document reason for call. If symptomatic, describe symptoms. ---Caller states has had been having headache, dizziness, and blurred off and on since Friday. Does the patient have any new or worsening symptoms? ---Yes Will a triage be completed? ---Yes Related visit to physician within the last 2 weeks? ---No Does the PT have any chronic conditions? (i.e. diabetes, asthma, etc.) ---Yes List chronic conditions. ---HTN, High Cholesterol, Anxiety Is this a behavioral health or substance abuse call? ---No Guidelines Guideline Title Affirmed Question Affirmed Notes Dizziness - Vertigo [1] Dizziness (vertigo) present now AND [2] one or more stroke risk factors (i.e., hypertension, diabetes, prior stroke/TIA/ heart attack) (Exception: prior physician evaluation for this AND no different/worse than usual) Final Disposition User Go to ED Now (or PCP triage) Ronnald Ramp, RN, Miranda Comments Appt scheduled for today at 9:45 with PCP Referrals REFERRED TO PCP OFFICE

## 2017-01-15 ENCOUNTER — Other Ambulatory Visit: Payer: Self-pay | Admitting: Family Medicine

## 2017-01-20 ENCOUNTER — Ambulatory Visit (INDEPENDENT_AMBULATORY_CARE_PROVIDER_SITE_OTHER): Payer: PPO | Admitting: Orthopaedic Surgery

## 2017-01-20 DIAGNOSIS — M79622 Pain in left upper arm: Secondary | ICD-10-CM | POA: Diagnosis not present

## 2017-01-20 MED ORDER — METHYLPREDNISOLONE 4 MG PO TABS: ORAL_TABLET | ORAL | 0 refills | Status: DC

## 2017-01-20 NOTE — Progress Notes (Signed)
Office Visit Note   Patient: Taylor Harvey.           Date of Birth: Jun 12, 1950           MRN: [id number] Visit Date: 01/20/2017              Requested by: Midge Minium, MD [address] [place], [place] [address] PCP: Midge Minium, MD   Assessment & Plan: Visit Diagnoses:  1. Left upper arm pain     Plan: Since is still likely an inflammatory process combined with a muscular skeletal strain and chronic elbow issues. I will put him on a six-day steroid taper as well as some topical sample anti-inflammatories. I like see him back in about a month to see how is doing overall. HIS records and answered.  Follow-Up Instructions: Return in about 4 weeks (around 02/17/2017).   Orders:  No orders of the defined types were placed in this encounter.  Meds ordered this encounter  Medications  . methylPREDNISolone (MEDROL) 4 MG tablet    Sig: Medrol dose pack. Take as instructed    Dispense:  21 tablet    Refill:  0      Procedures: No procedures performed   Clinical Data: No additional findings.   Subjective: No chief complaint on file. Patient has someone I've seen before but is been a while. His chief complaint today is left upper arm pain. His wife is concerned that this could be a blood clot or heart issues are resected chronic elbow pain for some time. He points the biceps as source of his pain when he holds arms different ways. He denies any numbness and tingling in his hand. He denies any chest pain. He denies any shortness of breath. He denies any swelling in his hand. It hurts mainly with activities in the arm.  HPI  Review of Systems He currently denies any headache, chest pain, shortness of breath, fever, chills, nausea, vomiting.  Objective: Vital Signs: There were no vitals taken for this visit.  Physical Exam He is alert and oriented 3 and in no acute distress Ortho Exam Examination of his left arm shows no swelling in the hand. He  has palpable radial and ulnar pulses. He has normal sensation throughout his hand. He has mid biceps pain but no deficits in the biceps tendon proximally or distally. He is global elbow pain but no elbow effusion. He has good range of motion of the elbow. Specialty Comments:  No specialty comments available.  Imaging: No results found.   PMFS History: Patient Active Problem List   Diagnosis Date Noted  . Left upper arm pain 01/20/2017  . Otitis media 06/01/2015  . Insomnia 07/19/2014  . Urinary frequency 07/19/2014  . Physical exam 07/19/2014  . UTI (urinary tract infection) 03/03/2014  . Acute bronchitis 09/05/2013  . Obesity (BMI 30-39.9) 01/20/2012  . ABRASION 09/25/2009  . NECK SPRAIN AND STRAIN 03/30/2009  . Hyperlipidemia 11/24/2008  . SINUSITIS- ACUTE-NOS 07/05/2008  . RHINITIS 07/05/2008  . ANXIETY STATE, UNSPECIFIED 06/02/2008  . NEOPLASM, SKIN, UNCERTAIN BEHAVIOR 24/58/0998  . TRIGGER FINGER 03/02/2008  . BACK PAIN WITH RADICULOPATHY 03/02/2008  . BACK PAIN 07/15/2007  . HEADACHE 03/04/2007  . DYSPNEA ON EXERTION 11/09/2006  . POLYP, COLON 07/19/2006  . Essential hypertension 07/19/2006  . Sleep apnea 07/19/2006  . DIVERTICULOSIS, ASYMPTOMATIC 06/11/2006   Past Medical History:  Diagnosis Date  . Allergy   . Anxiety   . Arthritis   .  Chronic kidney disease    hx kidney stones  . Depression   . GERD (gastroesophageal reflux disease)   . Hyperlipemia   . Hypertension   . Sleep apnea 2005   moderate-has cpap-not used in 3 yr    Family History  Problem Relation Age of Onset  . Diabetes Mother   . Pancreatitis Mother   . Heart disease Mother 90       MI, died with MI  . Stroke Father   . Hypertension Father   . Peripheral vascular disease Father        Carotid surgery  . Heart disease Sister        Arrhythmia  . Diabetes Sister 62       DM  . Kidney cancer Sister   . Diabetes Sister   . Cancer Sister        colon  . Heart disease Sister   .  Diabetes Sister   . Heart disease Sister   . Stroke Brother   . Colon cancer Sister   . Pancreatic cancer Unknown     Past Surgical History:  Procedure Laterality Date  . COLONOSCOPY    . INSERTION OF MESH  05/05/2012   Procedure: INSERTION OF MESH;  Surgeon: Rolm Bookbinder, MD;  Location: [facility];  Service: General;  Laterality: N/A;  . LITHOTRIPSY     x2  . trigger finger surgery  2006  . UMBILICAL HERNIA REPAIR  05/05/2012   Procedure: HERNIA REPAIR UMBILICAL ADULT;  Surgeon: Rolm Bookbinder, MD;  Location: [facility];  Service: General;  Laterality: N/A;   Social History   Occupational History  . retired    Social History Main Topics  . Smoking status: Never Smoker  . Smokeless tobacco: Never Used  . Alcohol use No  . Drug use: No  . Sexual activity: Yes    Partners: Female

## 2017-02-16 ENCOUNTER — Ambulatory Visit (INDEPENDENT_AMBULATORY_CARE_PROVIDER_SITE_OTHER): Payer: PPO | Admitting: Family Medicine

## 2017-02-16 ENCOUNTER — Encounter: Payer: Self-pay | Admitting: Family Medicine

## 2017-02-16 VITALS — BP 121/70 | HR 66 | Temp 98.1°F | Resp 16 | Ht 68.0 in | Wt 257.0 lb

## 2017-02-16 DIAGNOSIS — I10 Essential (primary) hypertension: Secondary | ICD-10-CM

## 2017-02-16 DIAGNOSIS — M79602 Pain in left arm: Secondary | ICD-10-CM | POA: Diagnosis not present

## 2017-02-16 DIAGNOSIS — E785 Hyperlipidemia, unspecified: Secondary | ICD-10-CM

## 2017-02-16 DIAGNOSIS — Z23 Encounter for immunization: Secondary | ICD-10-CM | POA: Diagnosis not present

## 2017-02-16 LAB — CBC WITH DIFFERENTIAL/PLATELET
BASOS ABS: 0 10*3/uL (ref 0.0–0.1)
BASOS PCT: 0.6 % (ref 0.0–3.0)
EOS ABS: 0.2 10*3/uL (ref 0.0–0.7)
Eosinophils Relative: 3.6 % (ref 0.0–5.0)
HEMATOCRIT: 43 % (ref 39.0–52.0)
HEMOGLOBIN: 14.4 g/dL (ref 13.0–17.0)
LYMPHS PCT: 34.7 % (ref 12.0–46.0)
Lymphs Abs: 1.5 10*3/uL (ref 0.7–4.0)
MCHC: 33.5 g/dL (ref 30.0–36.0)
MCV: 95.6 fl (ref 78.0–100.0)
Monocytes Absolute: 0.4 10*3/uL (ref 0.1–1.0)
Monocytes Relative: 9.9 % (ref 3.0–12.0)
Neutro Abs: 2.3 10*3/uL (ref 1.4–7.7)
Neutrophils Relative %: 51.2 % (ref 43.0–77.0)
Platelets: 164 10*3/uL (ref 150.0–400.0)
RBC: 4.5 Mil/uL (ref 4.22–5.81)
RDW: 13.5 % (ref 11.5–15.5)
WBC: 4.4 10*3/uL (ref 4.0–10.5)

## 2017-02-16 LAB — BASIC METABOLIC PANEL
BUN: 16 mg/dL (ref 6–23)
CHLORIDE: 111 meq/L (ref 96–112)
CO2: 25 mEq/L (ref 19–32)
Calcium: 9.1 mg/dL (ref 8.4–10.5)
Creatinine, Ser: 0.92 mg/dL (ref 0.40–1.50)
GFR: 87.03 mL/min (ref >60.00)
GLUCOSE: 134 mg/dL — AB (ref 70–99)
POTASSIUM: 4.5 meq/L (ref 3.5–5.1)
Sodium: 144 mEq/L (ref 135–145)

## 2017-02-16 LAB — HEPATIC FUNCTION PANEL
ALT: 25 U/L (ref 0–53)
AST: 18 U/L (ref 0–37)
Albumin: 4 g/dL (ref 3.5–5.2)
Alkaline Phosphatase: 84 U/L (ref 39–117)
BILIRUBIN DIRECT: 0.1 mg/dL (ref 0.0–0.3)
TOTAL PROTEIN: 6 g/dL (ref 6.0–8.3)
Total Bilirubin: 0.3 mg/dL (ref 0.2–1.2)

## 2017-02-16 LAB — LIPID PANEL
CHOL/HDL RATIO: 4
CHOLESTEROL: 108 mg/dL (ref 0–200)
HDL: 28.8 mg/dL — ABNORMAL LOW (ref >39.00)
NonHDL: 78.98
Triglycerides: 270 mg/dL — ABNORMAL HIGH (ref 0.0–149.0)
VLDL: 54 mg/dL — AB (ref 0.0–40.0)

## 2017-02-16 LAB — TSH: TSH: 1.45 u[IU]/mL (ref 0.35–4.50)

## 2017-02-16 LAB — LDL CHOLESTEROL, DIRECT: Direct LDL: 47 mg/dL
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
## 2017-02-16 NOTE — Assessment & Plan Note (Signed)
Chronic problem.  Tolerating statin w/o difficulty.  Pt has gained 6 lbs since last visit.  Stressed need for healthy diet and regular exercise.  Check labs.  Adjust meds prn

## 2017-02-16 NOTE — Progress Notes (Signed)
Pre visit review using our clinic review tool, if applicable. No additional management support is needed unless otherwise documented below in the visit note. 

## 2017-02-16 NOTE — Assessment & Plan Note (Signed)
Chronic problem.  Well controlled.  Asymptomatic.  Check labs.  No anticipated med changes.  Will follow. 

## 2017-02-16 NOTE — Patient Instructions (Signed)
Schedule your complete physical and Medicare Wellness Visit in 6 months We'll notify you of your lab results and make any changes if needed Continue to work on healthy diet and regular exercise- you can do it! Your arm pain appears to be muscular in nature and I wonder if you have a biceps tendonitis Call with any questions or concerns Happy Fall!!!

## 2017-02-17 ENCOUNTER — Ambulatory Visit (INDEPENDENT_AMBULATORY_CARE_PROVIDER_SITE_OTHER): Payer: PPO | Admitting: Orthopaedic Surgery

## 2017-02-17 ENCOUNTER — Other Ambulatory Visit (INDEPENDENT_AMBULATORY_CARE_PROVIDER_SITE_OTHER): Payer: PPO

## 2017-02-17 DIAGNOSIS — M25512 Pain in left shoulder: Secondary | ICD-10-CM

## 2017-02-17 DIAGNOSIS — M25522 Pain in left elbow: Secondary | ICD-10-CM | POA: Diagnosis not present

## 2017-02-17 DIAGNOSIS — R7309 Other abnormal glucose: Secondary | ICD-10-CM | POA: Diagnosis not present

## 2017-02-17 DIAGNOSIS — G8929 Other chronic pain: Secondary | ICD-10-CM | POA: Diagnosis not present

## 2017-02-17 LAB — HEMOGLOBIN A1C: HEMOGLOBIN A1C: 6.2 % (ref 4.6–6.5)

## 2017-02-17 MED ORDER — LIDOCAINE HCL 1 % IJ SOLN
3.0000 mL | INTRAMUSCULAR | Status: AC | PRN
  Administered 2017-02-17: 3 mL

## 2017-02-17 MED ORDER — LIDOCAINE HCL 1 % IJ SOLN
1.0000 mL | INTRAMUSCULAR | Status: AC | PRN
  Administered 2017-02-17: 1 mL

## 2017-02-17 MED ORDER — METHYLPREDNISOLONE ACETATE 40 MG/ML IJ SUSP
40.0000 mg | INTRAMUSCULAR | Status: AC | PRN
  Administered 2017-02-17: 40 mg via INTRA_ARTICULAR

## 2017-02-17 NOTE — Progress Notes (Signed)
Office Visit Note   Patient: Taylor Harvey.           Date of Birth: [birth year]           MRN: [id number] Visit Date: 02/17/2017              Requested by: Midge Minium, MD [address] [place], [place] [address] PCP: Midge Minium, MD   Assessment & Plan: Visit Diagnoses:  1. Chronic left shoulder pain   2. Pain in left elbow     Plan: Given the pain and he is having I'm recommending a steroid injection in his left shoulder subacromial area and the left elbow joint itself. His pain is nonspecific and seems be muscle related as well. Hopefully these injections will help things feel better. We'll see him back in 4 weeks to see if this is helped. My next step would be potentially an MRI of the left elbow acute bothering him.  Follow-Up Instructions: Return in about 4 weeks (around 03/17/2017).   Orders:  No orders of the defined types were placed in this encounter.  No orders of the defined types were placed in this encounter.     Procedures: Large Joint Inj Date/Time: 02/17/2017 11:10 AM Performed by: Mcarthur Rossetti Authorized by: [name] Y   Location:  Shoulder Site:  L subacromial bursa Ultrasound Guidance: No   Fluoroscopic Guidance: No   Arthrogram: No   Medications:  3 mL lidocaine 1 %; 40 mg methylPREDNISolone acetate 40 MG/ML Medium Joint Inj Date/Time: 02/17/2017 11:10 AM Performed by: Mcarthur Rossetti Authorized by: Mcarthur Rossetti   Location:  Elbow Site:  L elbow Ultrasound Guided: No   Fluoroscopic Guidance: No   Medications:  1 mL lidocaine 1 %; 40 mg methylPREDNISolone acetate 40 MG/ML     Clinical Data: No additional findings.   Subjective: No chief complaint on file. The patient is someone well-known to me. He comes in today with elbow and shoulder pain. His main around his elbow but is not specific. He feels like is a deep pain. 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 He  hurts in the mid humerus and the mid forearm but in the elbow and shoulder as well. Denies a numbness and tingling in his hand. He denies any specific wrist pain itself.  HPI  Review of Systems He denies any chest pain, headache, short of breath, fever, chills, nausea, vomiting.  Objective: Vital Signs: There were no vitals taken for this visit.  Physical Exam He is alert and oriented 3 in no acute distress Ortho Exam Exam lamination of his left upper extremity shows pain with range of motion of the shoulder and the elbow but no specific blocks to motion. His range of motion of his wrists is full on the left side. He is neurovascularly intact as well. His pain seems to be a deep pain. There is no pain over the epicondyles of the elbow and his elbow is ligamentously stable. He does have positive Neer and Hawkins signs for the shoulder. Specialty Comments:  No specialty comments available.  Imaging: No results found.   PMFS History: Patient Active Problem List   Diagnosis Date Noted  . Chronic left shoulder pain 02/17/2017  . Pain in left elbow 02/17/2017  . Left upper arm pain 01/20/2017  . Otitis media 06/01/2015  . Insomnia 07/19/2014  . Urinary frequency 07/19/2014  . Physical exam 07/19/2014  . UTI (  urinary tract infection) 03/03/2014  . Acute bronchitis 09/05/2013  . Obesity (BMI 30-39.9) 01/20/2012  . ABRASION 09/25/2009  . NECK SPRAIN AND STRAIN 03/30/2009  . Hyperlipidemia 11/24/2008  . SINUSITIS- ACUTE-NOS 07/05/2008  . RHINITIS 07/05/2008  . ANXIETY STATE, UNSPECIFIED 06/02/2008  . NEOPLASM, SKIN, UNCERTAIN BEHAVIOR 72/62/0355  . TRIGGER FINGER 03/02/2008  . BACK PAIN WITH RADICULOPATHY 03/02/2008  . BACK PAIN 07/15/2007  . HEADACHE 03/04/2007  . DYSPNEA ON EXERTION 11/09/2006  . POLYP, COLON 07/19/2006  . Essential hypertension 07/19/2006  . Sleep apnea 07/19/2006  . DIVERTICULOSIS, ASYMPTOMATIC 06/11/2006   Past Medical History:  Diagnosis Date  .  Allergy   . Anxiety   . Arthritis   . Chronic kidney disease    hx kidney stones  . Depression   . GERD (gastroesophageal reflux disease)   . Hyperlipemia   . Hypertension   . Sleep apnea 2005   moderate-has cpap-not used in 3 yr    Family History  Problem Relation Age of Onset  . Diabetes Mother   . Pancreatitis Mother   . Heart disease Mother 21       MI, died with MI  . Stroke Father   . Hypertension Father   . Peripheral vascular disease Father        Carotid surgery  . Heart disease Sister        Arrhythmia  . Diabetes Sister 53       DM  . Kidney cancer Sister   . Diabetes Sister   . Cancer Sister        colon  . Heart disease Sister   . Diabetes Sister   . Heart disease Sister   . Stroke Brother   . Colon cancer Sister   . Pancreatic cancer Unknown     Past Surgical History:  Procedure Laterality Date  . COLONOSCOPY    . INSERTION OF MESH  05/05/2012   Procedure: INSERTION OF MESH;  Surgeon: Rolm Bookbinder, MD;  Location: [facility];  Service: General;  Laterality: N/A;  . LITHOTRIPSY     x2  . trigger finger surgery  2006  . UMBILICAL HERNIA REPAIR  05/05/2012   Procedure: HERNIA REPAIR UMBILICAL ADULT;  Surgeon: Rolm Bookbinder, MD;  Location: [facility];  Service: General;  Laterality: N/A;   Social History   Occupational History  . retired    Social History Main Topics  . Smoking status: Never Smoker  . Smokeless tobacco: Never Used  . Alcohol use No  . Drug use: No  . Sexual activity: Yes    Partners: Female

## 2017-03-17 ENCOUNTER — Ambulatory Visit (INDEPENDENT_AMBULATORY_CARE_PROVIDER_SITE_OTHER): Payer: PPO | Admitting: Physician Assistant

## 2017-03-17 ENCOUNTER — Other Ambulatory Visit (INDEPENDENT_AMBULATORY_CARE_PROVIDER_SITE_OTHER): Payer: Self-pay

## 2017-03-17 ENCOUNTER — Other Ambulatory Visit: Payer: Self-pay | Admitting: Family Medicine

## 2017-03-17 DIAGNOSIS — M7712 Lateral epicondylitis, left elbow: Secondary | ICD-10-CM | POA: Diagnosis not present

## 2017-03-17 DIAGNOSIS — M25512 Pain in left shoulder: Secondary | ICD-10-CM | POA: Diagnosis not present

## 2017-03-17 DIAGNOSIS — M25522 Pain in left elbow: Secondary | ICD-10-CM

## 2017-03-17 NOTE — Progress Notes (Signed)
Office Visit Note   Patient: Taylor Harvey.           Date of Birth: 1950-06-29           MRN: [id number] Visit Date: 03/17/2017              Requested by: Midge Minium, MD [address] [place], [place] [address] PCP: Midge Minium, MD   Assessment & Plan: Visit Diagnoses:  1. Lateral epicondylitis, left elbow   2. Left shoulder pain, unspecified chronicity     Plan: We will obtain an MRI of his left elbow to evaluate tendon for tendon tear.  Have him follow-up after the MRI to go over the results and discuss further treatment.  In regards to his shoulder actually some forward flexion exercises and wall crawls to keep him from developing a frozen shoulder.  Follow-Up Instructions: Return for After MRI.   Orders:  No orders of the defined types were placed in this encounter.  No orders of the defined types were placed in this encounter.     Procedures: No procedures performed   Clinical Data: No additional findings.   Subjective: Left elbow pain Left shoulder pain  HPI Mr. Helderman returns today for follow-up of his left elbow and left shoulder pain.  He states he did great for a week after the injections in both left elbow and left shoulder.  His pain now is returned in the elbow.  States not really have any pain in the shoulder.  His pain does radiate down the arm into the hand but no numbness or tingling.  He also has tenderness in the mid biceps muscle belly especially with reaching out to grasp something.  Pain does awaken him. Review of Systems   Objective: Vital Signs: There were no vitals taken for this visit.  Physical Exam  Constitutional: He is oriented to person, place, and time. He appears well-developed and well-nourished. No distress.  Pulmonary/Chest: Effort normal.  Neurological: He is alert and oriented to person, place, and time.  Skin: He is not diaphoretic.  Psychiatric: He has a normal mood and affect.    Ortho  Exam Bilateral shoulders he has 5 out of 5 strength with external and internal rotation against resistance.  Active forward flexion left shoulder 110 degrees passively frame 180 degrees.  Negative impingement testing on the left.   Left elbow he has good range of motion elbow without pain.  Is nontender over the biceps medial epicondyle.  Tenderness over the lateral epicondyle.  He has pain with extension of the left wrist against resistance.  Nontender throughout the biceps including the proximal tendons.  Specialty Comments:  No specialty comments available.  Imaging: No results found.   PMFS History: Patient Active Problem List   Diagnosis Date Noted  . Chronic left shoulder pain 02/17/2017  . Pain in left elbow 02/17/2017  . Left upper arm pain 01/20/2017  . Otitis media 06/01/2015  . Insomnia 07/19/2014  . Urinary frequency 07/19/2014  . Physical exam 07/19/2014  . UTI (urinary tract infection) 03/03/2014  . Acute bronchitis 09/05/2013  . Obesity (BMI 30-39.9) 01/20/2012  . ABRASION 09/25/2009  . NECK SPRAIN AND STRAIN 03/30/2009  . Hyperlipidemia 11/24/2008  . SINUSITIS- ACUTE-NOS 07/05/2008  . RHINITIS 07/05/2008  . ANXIETY STATE, UNSPECIFIED 06/02/2008  . NEOPLASM, SKIN, UNCERTAIN BEHAVIOR 62/13/0865  . TRIGGER FINGER 03/02/2008  . BACK PAIN WITH RADICULOPATHY 03/02/2008  . BACK PAIN 07/15/2007  . HEADACHE  03/04/2007  . DYSPNEA ON EXERTION 11/09/2006  . POLYP, COLON 07/19/2006  . Essential hypertension 07/19/2006  . Sleep apnea 07/19/2006  . DIVERTICULOSIS, ASYMPTOMATIC 06/11/2006   Past Medical History:  Diagnosis Date  . Allergy   . Anxiety   . Arthritis   . Chronic kidney disease    hx kidney stones  . Depression   . GERD (gastroesophageal reflux disease)   . Hyperlipemia   . Hypertension   . Sleep apnea 2005   moderate-has cpap-not used in 3 yr    Family History  Problem Relation Age of Onset  . Diabetes Mother   . Pancreatitis Mother   . Heart  disease Mother 2       MI, died with MI  . Stroke Father   . Hypertension Father   . Peripheral vascular disease Father        Carotid surgery  . Heart disease Sister        Arrhythmia  . Diabetes Sister 38       DM  . Kidney cancer Sister   . Diabetes Sister   . Cancer Sister        colon  . Heart disease Sister   . Diabetes Sister   . Heart disease Sister   . Stroke Brother   . Colon cancer Sister   . Pancreatic cancer Unknown     Past Surgical History:  Procedure Laterality Date  . COLONOSCOPY    . INSERTION OF MESH  05/05/2012   Procedure: INSERTION OF MESH;  Surgeon: Rolm Bookbinder, MD;  Location: [place] [facility];  Service: General;  Laterality: N/A;  . LITHOTRIPSY     x2  . trigger finger surgery  2006  . UMBILICAL HERNIA REPAIR  05/05/2012   Procedure: HERNIA REPAIR UMBILICAL ADULT;  Surgeon: Rolm Bookbinder, MD;  Location: [place];  Service: General;  Laterality: N/A;   Social History   Occupational History  . retired    Social History Main Topics  . Smoking status: Never Smoker  . Smokeless tobacco: Never Used  . Alcohol use No  . Drug use: No  . Sexual activity: Yes    Partners: Female

## 2017-04-01 ENCOUNTER — Ambulatory Visit
Admission: RE | Admit: 2017-04-01 | Discharge: 2017-04-01 | Disposition: A | Discharge status: Home / Self Care | Payer: PPO | Source: Ambulatory Visit | Attending: Physician Assistant | Admitting: Physician Assistant

## 2017-04-01 DIAGNOSIS — M25522 Pain in left elbow: Secondary | ICD-10-CM

## 2017-04-01 DIAGNOSIS — M7711 Lateral epicondylitis, right elbow: Secondary | ICD-10-CM | POA: Diagnosis not present

## 2017-04-08 ENCOUNTER — Other Ambulatory Visit: Payer: Self-pay | Admitting: Family Medicine

## 2017-04-08 ENCOUNTER — Ambulatory Visit (INDEPENDENT_AMBULATORY_CARE_PROVIDER_SITE_OTHER): Payer: PPO | Admitting: Orthopaedic Surgery

## 2017-04-08 DIAGNOSIS — M7712 Lateral epicondylitis, left elbow: Secondary | ICD-10-CM | POA: Diagnosis not present

## 2017-04-08 NOTE — Progress Notes (Signed)
The patient is here for follow-up of an MRI of his left elbow.  He has been having significant pain over the lateral epicondyle area and we have injected his here at least once.  He was also having some anterior shoulder pain and arm pain.  He still denies any neck pain any numbness and tingling in his hand.  On examination he still has pain over the lateral epicondylar area of the left elbow with full range of motion of that left elbow and is ligamentously stable.  His pain does seem to be more tolerable and less.  His shoulder moves well.  His MRI of his left elbow does show evidence of lateral epicondylitis with interstitial tearing of the common extensor origin at the remainder the elbow is entirely normal.  Given the extent of his lateral epicondylitis I do feel that physical therapy formally will be worth trying and he agrees with this.  We can set him up with [facility] physical therapy to work on knee modalities to decrease his pain and improve his function of his elbow and shoulder.  I will see him back myself in about 6 weeks to see if therapy has helped.  We can always try one more injection at that point if needed.

## 2017-04-13 DIAGNOSIS — M7712 Lateral epicondylitis, left elbow: Secondary | ICD-10-CM | POA: Diagnosis not present

## 2017-04-13 DIAGNOSIS — M25522 Pain in left elbow: Secondary | ICD-10-CM | POA: Diagnosis not present

## 2017-04-28 DIAGNOSIS — M7712 Lateral epicondylitis, left elbow: Secondary | ICD-10-CM | POA: Diagnosis not present

## 2017-04-28 DIAGNOSIS — M25522 Pain in left elbow: Secondary | ICD-10-CM | POA: Diagnosis not present

## 2017-04-30 DIAGNOSIS — M7712 Lateral epicondylitis, left elbow: Secondary | ICD-10-CM | POA: Diagnosis not present

## 2017-04-30 DIAGNOSIS — M25522 Pain in left elbow: Secondary | ICD-10-CM | POA: Diagnosis not present

## 2017-05-06 DIAGNOSIS — M25522 Pain in left elbow: Secondary | ICD-10-CM | POA: Diagnosis not present

## 2017-05-06 DIAGNOSIS — M7712 Lateral epicondylitis, left elbow: Secondary | ICD-10-CM | POA: Diagnosis not present

## 2017-05-11 DIAGNOSIS — M7712 Lateral epicondylitis, left elbow: Secondary | ICD-10-CM | POA: Diagnosis not present

## 2017-05-11 DIAGNOSIS — M25522 Pain in left elbow: Secondary | ICD-10-CM | POA: Diagnosis not present

## 2017-05-13 DIAGNOSIS — M7712 Lateral epicondylitis, left elbow: Secondary | ICD-10-CM | POA: Diagnosis not present

## 2017-05-13 DIAGNOSIS — M25522 Pain in left elbow: Secondary | ICD-10-CM | POA: Diagnosis not present

## 2017-05-14 ENCOUNTER — Ambulatory Visit (INDEPENDENT_AMBULATORY_CARE_PROVIDER_SITE_OTHER): Payer: PPO | Admitting: Orthopaedic Surgery

## 2017-05-21 DIAGNOSIS — M7712 Lateral epicondylitis, left elbow: Secondary | ICD-10-CM | POA: Diagnosis not present

## 2017-05-21 DIAGNOSIS — M25522 Pain in left elbow: Secondary | ICD-10-CM | POA: Diagnosis not present

## 2017-05-27 ENCOUNTER — Ambulatory Visit (INDEPENDENT_AMBULATORY_CARE_PROVIDER_SITE_OTHER): Payer: PPO

## 2017-05-27 ENCOUNTER — Encounter (INDEPENDENT_AMBULATORY_CARE_PROVIDER_SITE_OTHER): Payer: Self-pay | Admitting: Orthopaedic Surgery

## 2017-05-27 ENCOUNTER — Ambulatory Visit (INDEPENDENT_AMBULATORY_CARE_PROVIDER_SITE_OTHER): Payer: PPO | Admitting: Orthopaedic Surgery

## 2017-05-27 DIAGNOSIS — M25512 Pain in left shoulder: Secondary | ICD-10-CM | POA: Diagnosis not present

## 2017-05-27 DIAGNOSIS — G8929 Other chronic pain: Secondary | ICD-10-CM

## 2017-05-27 DIAGNOSIS — M7712 Lateral epicondylitis, left elbow: Secondary | ICD-10-CM | POA: Diagnosis not present

## 2017-05-27 DIAGNOSIS — M25522 Pain in left elbow: Secondary | ICD-10-CM | POA: Diagnosis not present

## 2017-05-27 MED ORDER — LIDOCAINE HCL 1 % IJ SOLN
3.0000 mL | INTRAMUSCULAR | Status: AC | PRN
  Administered 2017-05-27: 3 mL

## 2017-05-27 MED ORDER — METHYLPREDNISOLONE ACETATE 40 MG/ML IJ SUSP
40.0000 mg | INTRAMUSCULAR | Status: AC | PRN
  Administered 2017-05-27: 40 mg via INTRA_ARTICULAR

## 2017-05-27 NOTE — Progress Notes (Signed)
Office Visit Note   Patient: Taylor Harvey.           Date of Birth: 06-17-1950           MRN: [id number] Visit Date: 05/27/2017              Requested by: Midge Minium, MD [address] [place], [place] [address] PCP: Midge Minium, MD   Assessment & Plan: Visit Diagnoses:  1. Chronic left shoulder pain     Plan: Right now he understands when I comes up into the MRI until we see if we can get him feeling better with injections in his left shoulder.  I talked about trying a subacromial injection today as well as setting him up for an intra-articular injection by Dr. Ernestina Patches to see if we can avoid any type of frozen shoulder types of symptoms.  We will see him back in follow-up after he has the intra-articular injection.  I provided the subacromial injection without difficulty.  He agrees with this plan as well.  Obviously if his symptoms are not improving we will obtain an MRI of his left shoulder.  Follow-Up Instructions: Return in about 3 weeks (around 06/17/2017).   Orders:  Orders Placed This Encounter  Procedures  . Large Joint Inj  . XR Shoulder Left   No orders of the defined types were placed in this encounter.     Procedures: Large Joint Inj: L subacromial bursa on 05/27/2017 4:22 PM Indications: pain and diagnostic evaluation Details: 22 G 1.5 in needle  Arthrogram: No  Medications: 3 mL lidocaine 1 %; 40 mg methylPREDNISolone acetate 40 MG/ML Outcome: tolerated well, no immediate complications Procedure, treatment alternatives, risks and benefits explained, specific risks discussed. Consent was given by the patient. Immediately prior to procedure a time out was called to verify the correct patient, procedure, equipment, support staff and site/side marked as required. Patient was prepped and draped in the usual sterile fashion.       Clinical Data: No additional findings.   Subjective: Chief Complaint  Patient presents with  . Left  Elbow - Pain, Follow-up   The patient was coming in today originally his follow-up for his left elbow having gone to physical therapy for chronic lateral epicondylitis of his left elbow.  However his shoulder is been quite stiff and the physical therapist feel like they cannot "release his tissue appropriately" from his left shoulder.  They feel like he needs an MRI to better evaluate the shoulder.  He does report stiffness and pain deep in that left shoulder but denies any specific trauma.  He denies any radicular symptoms.  He says physical therapy has helped the left elbow greatly.  He currently denies any headache, chest pain, shortness of breath, fever, chills, nausea, vomiting. HPI  Review of Systems Is alert and oriented x3 and in no acute distress  Objective: Vital Signs: There were no vitals taken for this visit.  Physical Exam He is alert and oriented x3 in no acute distress Ortho Exam Examination of his left shoulder does show some signs of impingement with pain with overhead activities and reaching behind but no significant weakness.  He does have positive Neer and Hawkins signs. Specialty Comments:  No specialty comments available.  Imaging: Xr Shoulder Left  Result Date: 05/27/2017 3 views left shoulder show well located shoulder with no acute findings.  There is moderate acromioclavicular arthritic changes.    PMFS History: Patient Active Problem  List   Diagnosis Date Noted  . Lateral epicondylitis, left elbow 04/08/2017  . Chronic left shoulder pain 02/17/2017  . Pain in left elbow 02/17/2017  . Left upper arm pain 01/20/2017  . Otitis media 06/01/2015  . Insomnia 07/19/2014  . Urinary frequency 07/19/2014  . Physical exam 07/19/2014  . UTI (urinary tract infection) 03/03/2014  . Acute bronchitis 09/05/2013  . Obesity (BMI 30-39.9) 01/20/2012  . ABRASION 09/25/2009  . NECK SPRAIN AND STRAIN 03/30/2009  . Hyperlipidemia 11/24/2008  . SINUSITIS- ACUTE-NOS  07/05/2008  . RHINITIS 07/05/2008  . ANXIETY STATE, UNSPECIFIED 06/02/2008  . NEOPLASM, SKIN, UNCERTAIN BEHAVIOR 99/83/3825  . TRIGGER FINGER 03/02/2008  . BACK PAIN WITH RADICULOPATHY 03/02/2008  . BACK PAIN 07/15/2007  . HEADACHE 03/04/2007  . DYSPNEA ON EXERTION 11/09/2006  . POLYP, COLON 07/19/2006  . Essential hypertension 07/19/2006  . Sleep apnea 07/19/2006  . DIVERTICULOSIS, ASYMPTOMATIC 06/11/2006   Past Medical History:  Diagnosis Date  . Allergy   . Anxiety   . Arthritis   . Chronic kidney disease    hx kidney stones  . Depression   . GERD (gastroesophageal reflux disease)   . Hyperlipemia   . Hypertension   . Sleep apnea 2005   moderate-has cpap-not used in 3 yr    Family History  Problem Relation Age of Onset  . Diabetes Mother   . Pancreatitis Mother   . Heart disease Mother 16       MI, died with MI  . Stroke Father   . Hypertension Father   . Peripheral vascular disease Father        Carotid surgery  . Heart disease Sister        Arrhythmia  . Diabetes Sister 33       DM  . Kidney cancer Sister   . Diabetes Sister   . Cancer Sister        colon  . Heart disease Sister   . Diabetes Sister   . Heart disease Sister   . Stroke Brother   . Colon cancer Sister   . Pancreatic cancer Unknown     Past Surgical History:  Procedure Laterality Date  . COLONOSCOPY    . INSERTION OF MESH  05/05/2012   Procedure: INSERTION OF MESH;  Surgeon: Rolm Bookbinder, MD;  Location: [place];  Service: General;  Laterality: N/A;  . LITHOTRIPSY     x2  . trigger finger surgery  2006  . UMBILICAL HERNIA REPAIR  05/05/2012   Procedure: HERNIA REPAIR UMBILICAL ADULT;  Surgeon: Rolm Bookbinder, MD;  Location: [place];  Service: General;  Laterality: N/A;   Social History   Occupational History  . Occupation: retired  Tobacco Use  . Smoking status: Never Smoker  . Smokeless tobacco: Never Used  Substance and Sexual  Activity  . Alcohol use: No  . Drug use: No  . Sexual activity: Yes    Partners: Female

## 2017-05-29 ENCOUNTER — Other Ambulatory Visit (INDEPENDENT_AMBULATORY_CARE_PROVIDER_SITE_OTHER): Payer: Self-pay

## 2017-05-29 DIAGNOSIS — M25512 Pain in left shoulder: Principal | ICD-10-CM

## 2017-05-29 DIAGNOSIS — G8929 Other chronic pain: Secondary | ICD-10-CM

## 2017-06-03 DIAGNOSIS — M25522 Pain in left elbow: Secondary | ICD-10-CM | POA: Diagnosis not present

## 2017-06-03 DIAGNOSIS — M7712 Lateral epicondylitis, left elbow: Secondary | ICD-10-CM | POA: Diagnosis not present

## 2017-06-10 ENCOUNTER — Ambulatory Visit (INDEPENDENT_AMBULATORY_CARE_PROVIDER_SITE_OTHER): Payer: PPO

## 2017-06-10 ENCOUNTER — Ambulatory Visit (INDEPENDENT_AMBULATORY_CARE_PROVIDER_SITE_OTHER): Payer: PPO | Admitting: Physical Medicine and Rehabilitation

## 2017-06-10 ENCOUNTER — Encounter (INDEPENDENT_AMBULATORY_CARE_PROVIDER_SITE_OTHER): Payer: Self-pay | Admitting: Physical Medicine and Rehabilitation

## 2017-06-10 DIAGNOSIS — G8929 Other chronic pain: Secondary | ICD-10-CM

## 2017-06-10 DIAGNOSIS — M25512 Pain in left shoulder: Secondary | ICD-10-CM

## 2017-06-10 NOTE — Progress Notes (Signed)
Taylor Harvey. - 67 y.o. male MRN [id number]  Date of birth: 07/28/1950  Office Visit Note: Visit Date: 06/10/2017 PCP: Midge Minium, MD Referred by: Midge Minium, MD  Subjective: Chief Complaint  Patient presents with  . Left Shoulder - Pain  . Left Elbow - Pain   HPI: Taylor Harvey is a 67 year old right-hand-dominant gentleman who comes in today at the request of Dr. Ninfa Linden for diagnostic and hopefully therapeutic anesthetic intra-articular arthrogram of the left glenohumeral joint.  He was originally seeing Dr. Ninfa Linden for lateral epicondylitis but was in physical therapy and the therapist reported decreased range of motion of the left shoulder.  Dr. Ninfa Linden has looked at this time is no real signs of arthritic changes although he does have positive impingement sign.  The patient did have a subacromial injection which was only slightly beneficial.  His shoulder pain has been ongoing for over a year.  Reaching for objects makes this much worse.  He is still going to physical therapy and this is helped.    ROS Otherwise per HPI.  Assessment & Plan: Visit Diagnoses:  1. Chronic left shoulder pain     Plan: Findings:  Diagnostic and hopefully therapeutic left glenohumeral anesthetic arthrogram.  Patient did have relief during the anesthetic phase of the injection.    Meds & Orders: No orders of the defined types were placed in this encounter.   Orders Placed This Encounter  Procedures  . Large Joint Inj: L glenohumeral  . XR C-ARM NO REPORT    Follow-up: Return if symptoms worsen or fail to improve, for Dr. Ninfa Linden.   Procedures: Large Joint Inj: L glenohumeral on 06/10/2017 3:35 PM Indications: pain and diagnostic evaluation Details: 22 G 3.5 in needle, anteromedial approach  Arthrogram: Yes  Medications: 80 mg triamcinolone acetonide 40 MG/ML; 3 mL bupivacaine 0.5 %  Arthrogram demonstrated excellent flow of contrast throughout the joint surface  without extravasation or obvious defect.  The patient had relief of symptoms during the anesthetic phase of the injection.  Procedure, treatment alternatives, risks and benefits explained, specific risks discussed. Consent was given by the patient. Immediately prior to procedure a time out was called to verify the correct patient, procedure, equipment, support staff and site/side marked as required. Patient was prepped and draped in the usual sterile fashion.      No notes on file   Clinical History: No specialty comments available.  He reports that  has never smoked. he has never used smokeless tobacco.  Recent Labs    07/22/16 1230 02/17/17 1537  HGBA1C 6.4 6.2    Objective:  VS:  HT:    WT:   BMI:     BP:   HR: bpm  TEMP: ( )  RESP:  Physical Exam  Ortho Exam Imaging: No results found.  Past Medical/Family/Surgical/Social History: Medications & Allergies reviewed per EMR Patient Active Problem List   Diagnosis Date Noted  . Lateral epicondylitis, left elbow 04/08/2017  . Chronic left shoulder pain 02/17/2017  . Pain in left elbow 02/17/2017  . Left upper arm pain 01/20/2017  . Otitis media 06/01/2015  . Insomnia 07/19/2014  . Urinary frequency 07/19/2014  . Physical exam 07/19/2014  . UTI (urinary tract infection) 03/03/2014  . Acute bronchitis 09/05/2013  . Obesity (BMI 30-39.9) 01/20/2012  . ABRASION 09/25/2009  . NECK SPRAIN AND STRAIN 03/30/2009  . Hyperlipidemia 11/24/2008  . SINUSITIS- ACUTE-NOS 07/05/2008  . RHINITIS 07/05/2008  . ANXIETY STATE, UNSPECIFIED 06/02/2008  .  NEOPLASM, SKIN, UNCERTAIN BEHAVIOR 11/19/9483  . TRIGGER FINGER 03/02/2008  . BACK PAIN WITH RADICULOPATHY 03/02/2008  . BACK PAIN 07/15/2007  . HEADACHE 03/04/2007  . DYSPNEA ON EXERTION 11/09/2006  . POLYP, COLON 07/19/2006  . Essential hypertension 07/19/2006  . Sleep apnea 07/19/2006  . DIVERTICULOSIS, ASYMPTOMATIC 06/11/2006   Past Medical History:  Diagnosis Date  .  Allergy   . Anxiety   . Arthritis   . Chronic kidney disease    hx kidney stones  . Depression   . GERD (gastroesophageal reflux disease)   . Hyperlipemia   . Hypertension   . Sleep apnea 2005   moderate-has cpap-not used in 3 yr   Family History  Problem Relation Age of Onset  . Diabetes Mother   . Pancreatitis Mother   . Heart disease Mother 79       MI, died with MI  . Stroke Father   . Hypertension Father   . Peripheral vascular disease Father        Carotid surgery  . Heart disease Sister        Arrhythmia  . Diabetes Sister 39       DM  . Kidney cancer Sister   . Diabetes Sister   . Cancer Sister        colon  . Heart disease Sister   . Diabetes Sister   . Heart disease Sister   . Stroke Brother   . Colon cancer Sister   . Pancreatic cancer Unknown    Past Surgical History:  Procedure Laterality Date  . COLONOSCOPY    . INSERTION OF MESH  05/05/2012   Procedure: INSERTION OF MESH;  Surgeon: Rolm Bookbinder, MD;  Location: [place];  Service: General;  Laterality: N/A;  . LITHOTRIPSY     x2  . trigger finger surgery  2006  . UMBILICAL HERNIA REPAIR  05/05/2012   Procedure: HERNIA REPAIR UMBILICAL ADULT;  Surgeon: Rolm Bookbinder, MD;  Location: [place];  Service: General;  Laterality: N/A;   Social History   Occupational History  . Occupation: retired  Tobacco Use  . Smoking status: Never Smoker  . Smokeless tobacco: Never Used  Substance and Sexual Activity  . Alcohol use: No  . Drug use: No  . Sexual activity: Yes    Partners: Female

## 2017-06-10 NOTE — Progress Notes (Deleted)
Pt states pain in right shoulder that radiates into the right elbow. Pt states pain has been going on for about 1 yr. Pt states reaching for things makes the pain worse, pt states tylenol and a heating pad eases pain . Pt states he has been going to PT and it is helping. -Dye Allergies.

## 2017-06-10 NOTE — Patient Instructions (Signed)

## 2017-06-11 ENCOUNTER — Other Ambulatory Visit: Payer: Self-pay | Admitting: Family Medicine

## 2017-06-17 ENCOUNTER — Encounter (INDEPENDENT_AMBULATORY_CARE_PROVIDER_SITE_OTHER): Payer: Self-pay | Admitting: Orthopaedic Surgery

## 2017-06-17 ENCOUNTER — Ambulatory Visit (INDEPENDENT_AMBULATORY_CARE_PROVIDER_SITE_OTHER): Payer: PPO | Admitting: Orthopaedic Surgery

## 2017-06-17 DIAGNOSIS — G8929 Other chronic pain: Secondary | ICD-10-CM

## 2017-06-17 DIAGNOSIS — M25512 Pain in left shoulder: Secondary | ICD-10-CM | POA: Diagnosis not present

## 2017-06-17 NOTE — Progress Notes (Signed)
Taylor Harvey returns today follow-up of his left shoulder.  He underwent a intra-articular injection of left shoulder by Dr. Ernestina Patches on 05/31/2017.  He states he is no longer having any pain in the shoulder.  His first time he has had no pain in the shoulder for the past 6 months.  He states that his range of motion and overall strength of the shoulder is greatly improved since the injection.  He has been released from formal physical therapy and is doing a home exercise program as taught by therapy.  Review of systems: No radicular symptoms down the left arm.  Otherwise please see HPI  Physical exam bilateral shoulders he has 5 out of 5 strength with external and internal rotation against resistance.  Empty can test is negative bilaterally.  Negative impingement testing on the left.  Full range of motion of the left shoulder without pain passively and active.  Impression: Left shoulder pain resolved  Plan: He will continue his home exercise program as taught by physical therapy.  He will follow-up with [place] on an as-needed basis for the shoulder pain comes back in a relatively short period of time less than 6 months with recommend MRI of the shoulder to evaluate for internal derangement/arthritic changes of the shoulder joint.  Questions encouraged and answered at length.

## 2017-06-22 MED ORDER — BUPIVACAINE HCL 0.5 % IJ SOLN
3.0000 mL | INTRAMUSCULAR | Status: AC | PRN
  Administered 2017-06-10: 3 mL via INTRA_ARTICULAR

## 2017-06-22 MED ORDER — TRIAMCINOLONE ACETONIDE 40 MG/ML IJ SUSP
80.0000 mg | INTRAMUSCULAR | Status: AC | PRN
  Administered 2017-06-10: 80 mg via INTRA_ARTICULAR

## 2017-07-17 ENCOUNTER — Other Ambulatory Visit: Payer: Self-pay | Admitting: Family Medicine

## 2017-07-22 NOTE — Progress Notes (Addendum)
Subjective:   Taylor Harvey. is a 67 y.o. male who presents for Medicare Annual/Subsequent preventive examination.  Review of Systems:  No ROS.  Medicare Wellness Visit. Additional risk factors are reflected in the social history.  Cardiac Risk Factors include: advanced age (>1men, >79 women);dyslipidemia;male gender;hypertension;obesity (BMI >30kg/m2);family history of premature cardiovascular disease Sleep patterns: Sleeps 6-7 hours. Does not use CPAP. Would like new equipment.  Home Safety/Smoke Alarms: Feels safe in home. Smoke alarms in place.  Living environment; residence and Firearm Safety: Lives with wife and 2 adult children in 1 story home.  Seat Belt Safety/Bike Helmet: Wears seat belt.   Male:   CCS-Colonoscopy 08/13/2016, diverticula. Recall 5 years.    PSA-  Lab Results  Component Value Date   PSA 0.27 07/21/2016   PSA 0.33 07/19/2014   PSA 0.31 06/28/2013       Objective:    Vitals: BP (!) 164/80 (BP Location: Left Arm, Patient Position: Sitting, Cuff Size: Normal)   Pulse 68   Temp 97.9 F (36.6 C) (Temporal)   Resp 16   Ht 5\' 8"  (1.727 m)   Wt 251 lb 9.6 oz (114.1 kg)   SpO2 98%   BMI 38.26 kg/m   Body mass index is 38.26 kg/m.  Advanced Directives 07/23/2017 08/13/2016 07/30/2016 05/04/2012  Does Patient Have a Medical Advance Directive? No Yes No Patient does not have advance directive  Would patient like information on creating a medical advance directive? No - Patient declined - - -    Tobacco Social History   Tobacco Use  Smoking Status Never Smoker  Smokeless Tobacco Never Used     Counseling given: Not Answered    Past Medical History:  Diagnosis Date  . Allergy   . Anxiety   . Arthritis   . Chronic kidney disease    hx kidney stones  . Depression   . GERD (gastroesophageal reflux disease)   . Hyperlipemia   . Hypertension   . Sleep apnea 2005   moderate-has cpap-not used in 3 yr   Past Surgical History:  Procedure  Laterality Date  . COLONOSCOPY    . INSERTION OF MESH  05/05/2012   Procedure: INSERTION OF MESH;  Surgeon: Rolm Bookbinder, MD;  Location: [place];  Service: General;  Laterality: N/A;  . LITHOTRIPSY     x2  . trigger finger surgery  2006  . UMBILICAL HERNIA REPAIR  05/05/2012   Procedure: HERNIA REPAIR UMBILICAL ADULT;  Surgeon: Rolm Bookbinder, MD;  Location: [place];  Service: General;  Laterality: N/A;   Family History  Problem Relation Age of Onset  . Diabetes Mother   . Pancreatitis Mother   . Heart disease Mother 63       MI, died with MI  . Stroke Father   . Hypertension Father   . Peripheral vascular disease Father        Carotid surgery  . Heart disease Sister        Arrhythmia  . Diabetes Sister 82       DM  . Kidney cancer Sister   . Diabetes Sister   . Cancer Sister        colon  . Heart disease Sister   . Diabetes Sister   . Heart disease Sister   . Stroke Brother   . Colon cancer Sister   . Pancreatic cancer Unknown    Social History   Socioeconomic History  . Marital status: Married  Spouse name: None  . Number of children: 6  . Years of education: None  . Highest education level: None  Social Needs  . Financial resource strain: None  . Food insecurity - worry: None  . Food insecurity - inability: None  . Transportation needs - medical: None  . Transportation needs - non-medical: None  Occupational History  . Occupation: retired  Tobacco Use  . Smoking status: Never Smoker  . Smokeless tobacco: Never Used  Substance and Sexual Activity  . Alcohol use: No  . Drug use: No  . Sexual activity: Yes    Partners: Female  Other Topics Concern  . None  Social History Narrative   Exercise--walks a mile 5 days a week.  One deceased child age 57.  One stepchild, 5 adopted.     Outpatient Encounter Medications as of 07/23/2017  Medication Sig  . aspirin 81 MG tablet Take 81 mg by mouth daily.    .  citalopram (CELEXA) 20 MG tablet TAKE 1 TABLET BY MOUTH ONCE DAILY  . esomeprazole (NEXIUM) 40 MG capsule Take 40 mg by mouth daily.  . simvastatin (ZOCOR) 10 MG tablet TAKE 1 TABLET BY MOUTH ONCE DAILY  . telmisartan (MICARDIS) 40 MG tablet TAKE 1 TABLET BY MOUTH ONCE DAILY  . traZODone (DESYREL) 50 MG tablet TAKE 1/2 TO 1 (ONE-HALF TO ONE) TABLET BY MOUTH AT BEDTIME AS NEEDED FOR SLEEP  . Zoster Vaccine Adjuvanted [facility]) injection Inject 0.5 mLs into the muscle once for 1 dose.   No facility-administered encounter medications on file as of 07/23/2017.     Activities of Daily Living In your present state of health, do you have any difficulty performing the following activities: 07/23/2017 02/16/2017  Hearing? N N  Vision? N N  Difficulty concentrating or making decisions? N N  Walking or climbing stairs? N N  Dressing or bathing? N N  Doing errands, shopping? N N  Preparing Food and eating ? N -  Using the Toilet? N -  In the past six months, have you accidently leaked urine? N -  Do you have problems with loss of bowel control? N -  Managing your Medications? N -  Managing your Finances? N -  Housekeeping or managing your Housekeeping? N -  Some recent data might be hidden    Patient Care Team: Midge Minium, MD as PCP - General (Family Medicine) Midge Minium, MD as Consulting Physician (Family Medicine) Danis, Kirke Corin, MD as Consulting Physician (Gastroenterology) Mcarthur Rossetti, MD as Consulting Physician (Orthopedic Surgery)   Assessment:   This is a routine wellness examination for [place].  Exercise Activities and Dietary recommendations Current Exercise Habits: Home exercise routine, Type of exercise: walking, Time (Minutes): 45, Frequency (Times/Week): 4, Weekly Exercise (Minutes/Week): 180, Exercise limited by: None identified Diet (meal preparation, eat out, water intake, caffeinated beverages, dairy products, fruits and vegetables): Drinks  coffee, water and diet soda (3/week).   Breakfast: "nabs", cereal, oatmeal, coffee Lunch: sandwich or skips Dinner: protein and vegetables.      Goals    . Weight (lb) < 230 lb (104.3 kg)     Lose weight by increasing activity and watching diet.        Fall Risk Fall Risk  07/23/2017 02/16/2017 01/07/2016 07/19/2014  Falls in the past year? Yes No No No  Number falls in past yr: 2 or more - - -  Injury with Fall? No - - -  Follow up Falls prevention  discussed - - -    Depression Screen PHQ 2/9 Scores 07/23/2017 02/16/2017 10/30/2016 07/21/2016  PHQ - 2 Score 0 0 0 0  PHQ- 9 Score 1 0 0 0    Cognitive Function MMSE - Mini Mental State Exam 07/23/2017  Orientation to time 5  Orientation to Place 5  Registration 3  Attention/ Calculation 5  Recall 3  Language- name 2 objects 2  Language- repeat 1  Language- follow 3 step command 3  Language- read & follow direction 1  Write a sentence 1  Copy design 1  Total score 30        Immunization History  Administered Date(s) Administered  . Influenza Split 03/29/2012  . Influenza Whole 03/30/2009, 02/24/2011  . Influenza,inj,Quad PF,6+ Mos 03/21/2013, 03/17/2014, 03/20/2016, 02/16/2017  . Pneumococcal Conjugate-13 07/19/2014  . Pneumococcal Polysaccharide-23 01/07/2016  . Td 03/04/2007  . Zoster 03/29/2012    Screening Tests Health Maintenance  Topic Date Due  . TETANUS/TDAP  07/23/2018 (Originally 03/03/2017)  . COLONOSCOPY  08/13/2021  . INFLUENZA VACCINE  Completed  . Hepatitis C Screening  Completed  . PNA vac Low Risk Adult  Completed      Plan:      Shingles vaccine at pharmacy.   Schedule eye exam.   Continue doing brain stimulating activities (puzzles, reading, adult coloring books, staying active) to keep memory sharp.   I have personally reviewed and noted the following in the patient's chart:   . Medical and social history . Use of alcohol, tobacco or illicit drugs  . Current medications and  supplements . Functional ability and status . Nutritional status . Physical activity . Advanced directives . List of other physicians . Hospitalizations, surgeries, and ER visits in previous 12 months . Vitals . Screenings to include cognitive, depression, and falls . Referrals and appointments  In addition, I have reviewed and discussed with patient certain preventive protocols, quality metrics, and best practice recommendations. A written personalized care plan for preventive services as well as general preventive health recommendations were provided to patient.     Gerilyn Nestle, RN  07/23/2017  Reviewed documentation provided by RN and agree w/ above.  [name], MD

## 2017-07-23 ENCOUNTER — Other Ambulatory Visit: Payer: Self-pay

## 2017-07-23 ENCOUNTER — Encounter: Payer: Self-pay | Admitting: Family Medicine

## 2017-07-23 ENCOUNTER — Ambulatory Visit (INDEPENDENT_AMBULATORY_CARE_PROVIDER_SITE_OTHER): Payer: PPO

## 2017-07-23 ENCOUNTER — Ambulatory Visit (INDEPENDENT_AMBULATORY_CARE_PROVIDER_SITE_OTHER): Payer: PPO | Admitting: Family Medicine

## 2017-07-23 VITALS — BP 140/80 | HR 68 | Temp 97.9°F | Resp 16 | Ht 68.0 in | Wt 251.4 lb

## 2017-07-23 VITALS — BP 158/80 | HR 68 | Temp 97.9°F | Resp 16 | Ht 68.0 in | Wt 251.6 lb

## 2017-07-23 DIAGNOSIS — Z125 Encounter for screening for malignant neoplasm of prostate: Secondary | ICD-10-CM

## 2017-07-23 DIAGNOSIS — Z23 Encounter for immunization: Secondary | ICD-10-CM | POA: Diagnosis not present

## 2017-07-23 DIAGNOSIS — I10 Essential (primary) hypertension: Secondary | ICD-10-CM | POA: Diagnosis not present

## 2017-07-23 DIAGNOSIS — Z0000 Encounter for general adult medical examination without abnormal findings: Secondary | ICD-10-CM

## 2017-07-23 LAB — CBC WITH DIFFERENTIAL/PLATELET
Basophils Absolute: 0 10*3/uL (ref 0.0–0.1)
Basophils Relative: 0.7 % (ref 0.0–3.0)
EOS PCT: 2.1 % (ref 0.0–5.0)
Eosinophils Absolute: 0.1 10*3/uL (ref 0.0–0.7)
HCT: 43.4 % (ref 39.0–52.0)
HEMOGLOBIN: 14.5 g/dL (ref 13.0–17.0)
Lymphocytes Relative: 32.6 % (ref 12.0–46.0)
Lymphs Abs: 1.2 10*3/uL (ref 0.7–4.0)
MCHC: 33.5 g/dL (ref 30.0–36.0)
MCV: 95.5 fl (ref 78.0–100.0)
MONO ABS: 0.5 10*3/uL (ref 0.1–1.0)
MONOS PCT: 12.3 % — AB (ref 3.0–12.0)
Neutro Abs: 2 10*3/uL (ref 1.4–7.7)
Neutrophils Relative %: 52.3 % (ref 43.0–77.0)
Platelets: 161 10*3/uL (ref 150.0–400.0)
RBC: 4.54 Mil/uL (ref 4.22–5.81)
RDW: 13 % (ref 11.5–15.5)
WBC: 3.8 10*3/uL — AB (ref 4.0–10.5)

## 2017-07-23 LAB — HEPATIC FUNCTION PANEL
ALBUMIN: 3.7 g/dL (ref 3.5–5.2)
ALT: 30 U/L (ref 0–53)
AST: 19 U/L (ref 0–37)
Alkaline Phosphatase: 82 U/L (ref 39–117)
Bilirubin, Direct: 0.1 mg/dL (ref 0.0–0.3)
Total Bilirubin: 0.5 mg/dL (ref 0.2–1.2)
Total Protein: 6 g/dL (ref 6.0–8.3)

## 2017-07-23 LAB — BASIC METABOLIC PANEL
BUN: 11 mg/dL (ref 6–23)
CALCIUM: 9.2 mg/dL (ref 8.4–10.5)
CO2: 29 mEq/L (ref 19–32)
CREATININE: 0.94 mg/dL (ref 0.40–1.50)
Chloride: 107 mEq/L (ref 96–112)
GFR: 84.79 mL/min (ref >60.00)
Glucose, Bld: 114 mg/dL — ABNORMAL HIGH (ref 70–99)
Potassium: 4.3 mEq/L (ref 3.5–5.1)
Sodium: 142 mEq/L (ref 135–145)

## 2017-07-23 LAB — LIPID PANEL
CHOL/HDL RATIO: 3
CHOLESTEROL: 92 mg/dL (ref 0–200)
HDL: 30.8 mg/dL — AB (ref >39.00)
LDL CALC: 46 mg/dL (ref 0–99)
NonHDL: 61.08
TRIGLYCERIDES: 75 mg/dL (ref 0.0–149.0)
VLDL: 15 mg/dL (ref 0.0–40.0)

## 2017-07-23 LAB — PSA, MEDICARE: PSA: 0.36 ng/ml (ref 0.10–4.00)

## 2017-07-23 LAB — TSH: TSH: 0.93 u[IU]/mL (ref 0.35–4.50)

## 2017-07-23 MED ORDER — ZOSTER VAC RECOMB ADJUVANTED 50 MCG/0.5ML IM SUSR: 0.5000 mL | Freq: Once | INTRAMUSCULAR | 1 refills | Status: AC

## 2017-07-23 NOTE — Progress Notes (Signed)
   Subjective:    Patient ID: Taylor Hua., male    DOB: 02-08-1951, 67 y.o.   MRN: [id number]  HPI CPE- UTD on colonoscopy, immunizations.   Elevated BP- pt reports he had a HA 2 weeks ago and wife checked BP and it was elevated (160s/80s)   Review of Systems Patient reports no vision/hearing changes, anorexia, fever ,adenopathy, persistant/recurrent hoarseness, swallowing issues, chest pain, palpitations, edema, persistant/recurrent cough, hemoptysis, dyspnea (rest,exertional, paroxysmal nocturnal), gastrointestinal  bleeding (melena, rectal bleeding), abdominal pain, excessive heart burn, GU symptoms (dysuria, hematuria, voiding/incontinence issues) syncope, focal weakness, memory loss, numbness & tingling, skin/hair/nail changes, depression, anxiety, abnormal bruising/bleeding, musculoskeletal symptoms/signs.   Reviewed past medical, surgical, family and social histories.    Objective:   Physical Exam General Appearance:    Alert, cooperative, no distress, appears stated age, obese  Head:    Normocephalic, without obvious abnormality, atraumatic  Eyes:    PERRL, conjunctiva/corneas clear, EOM's intact, fundi    benign, both eyes       Ears:    Normal TM's and external ear canals, both ears  Nose:   Nares normal, septum midline, mucosa normal, no drainage   or sinus tenderness  Throat:   Lips, mucosa, and tongue normal; teeth and gums normal  Neck:   Supple, symmetrical, trachea midline, no adenopathy;       thyroid:  No enlargement/tenderness/nodules  Back:     Symmetric, no curvature, ROM normal, no CVA tenderness  Lungs:     Clear to auscultation bilaterally, respirations unlabored  Chest wall:    No tenderness or deformity  Heart:    Regular rate and rhythm, S1 and S2 normal, no murmur, rub   or gallop  Abdomen:     Soft, non-tender, bowel sounds active all four quadrants,    no masses, no organomegaly  Genitalia:    Deferred at pt request  Rectal:    Extremities:    Extremities normal, atraumatic, no cyanosis or edema  Pulses:   2+ and symmetric all extremities  Skin:   Skin color, texture, turgor normal, no rashes or lesions  Lymph nodes:   Cervical, supraclavicular, and axillary nodes normal  Neurologic:   CNII-XII intact. Normal strength, sensation and reflexes      throughout          Assessment & Plan:

## 2017-07-23 NOTE — Assessment & Plan Note (Signed)
Deteriorated.  Pt's BP is elevated x2 weeks and he has had associated headaches.  Will increase Micardis to 80mg  daily and monitor closely for improvement.  Pt expressed understanding and is in agreement w/ plan.

## 2017-07-23 NOTE — Patient Instructions (Addendum)
Follow up in 1 month to recheck BP We'll notify you of your lab results and make any changes if needed DOUBLE the Micardis (Telmisartan) to 80mg  (2 tabs) daily Keep up the good work on healthy diet and regular exercise- you're doing great! Call with any questions or concerns Have a great weekend!!!

## 2017-07-23 NOTE — Patient Instructions (Addendum)
Shingles vaccine at pharmacy.   Schedule eye exam.   Continue doing brain stimulating activities (puzzles, reading, adult coloring books, staying active) to keep memory sharp.    Health Maintenance, Male A healthy lifestyle and preventive care is important for your health and wellness. Ask your health care provider about what schedule of regular examinations is right for you. What should I know about weight and diet? Eat a Healthy Diet  Eat plenty of vegetables, fruits, whole grains, low-fat dairy products, and lean protein.  Do not eat a lot of foods high in solid fats, added sugars, or salt.  Maintain a Healthy Weight Regular exercise can help you achieve or maintain a healthy weight. You should:  Do at least 150 minutes of exercise each week. The exercise should increase your heart rate and make you sweat (moderate-intensity exercise).  Do strength-training exercises at least twice a week.  Watch Your Levels of Cholesterol and Blood Lipids  Have your blood tested for lipids and cholesterol every 5 years starting at 68 years of age. If you are at high risk for heart disease, you should start having your blood tested when you are 68 years old. You may need to have your cholesterol levels checked more often if: ? Your lipid or cholesterol levels are high. ? You are older than 68 years of age. ? You are at high risk for heart disease.  What should I know about cancer screening? Many types of cancers can be detected early and may often be prevented. Lung Cancer  You should be screened every year for lung cancer if: ? You are a current smoker who has smoked for at least 30 years. ? You are a former smoker who has quit within the past 15 years.  Talk to your health care provider about your screening options, when you should start screening, and how often you should be screened.  Colorectal Cancer  Routine colorectal cancer screening usually begins at 68 years of age and should  be repeated every 5-10 years until you are 68 years old. You may need to be screened more often if early forms of precancerous polyps or small growths are found. Your health care provider may recommend screening at an earlier age if you have risk factors for colon cancer.  Your health care provider may recommend using home test kits to check for hidden blood in the stool.  A small camera at the end of a tube can be used to examine your colon (sigmoidoscopy or colonoscopy). This checks for the earliest forms of colorectal cancer.  Prostate and Testicular Cancer  Depending on your age and overall health, your health care provider may do certain tests to screen for prostate and testicular cancer.  Talk to your health care provider about any symptoms or concerns you have about testicular or prostate cancer.  Skin Cancer  Check your skin from head to toe regularly.  Tell your health care provider about any new moles or changes in moles, especially if: ? There is a change in a mole's size, shape, or color. ? You have a mole that is larger than a pencil eraser.  Always use sunscreen. Apply sunscreen liberally and repeat throughout the day.  Protect yourself by wearing long sleeves, pants, a wide-brimmed hat, and sunglasses when outside.  What should I know about heart disease, diabetes, and high blood pressure?  If you are 76-60 years of age, have your blood pressure checked every 3-5 years. If you are 40 years  of age or older, have your blood pressure checked every year. You should have your blood pressure measured twice-once when you are at a hospital or clinic, and once when you are not at a hospital or clinic. Record the average of the two measurements. To check your blood pressure when you are not at a hospital or clinic, you can use: ? An automated blood pressure machine at a pharmacy. ? A home blood pressure monitor.  Talk to your health care provider about your target blood  pressure.  If you are between 44-30 years old, ask your health care provider if you should take aspirin to prevent heart disease.  Have regular diabetes screenings by checking your fasting blood sugar level. ? If you are at a normal weight and have a low risk for diabetes, have this test once every three years after the age of 78. ? If you are overweight and have a high risk for diabetes, consider being tested at a younger age or more often.  A one-time screening for abdominal aortic aneurysm (AAA) by ultrasound is recommended for men aged 16-75 years who are current or former smokers. What should I know about preventing infection? Hepatitis B If you have a higher risk for hepatitis B, you should be screened for this virus. Talk with your health care provider to find out if you are at risk for hepatitis B infection. Hepatitis C Blood testing is recommended for:  Everyone born from [birth year] through 1966.  Anyone with known risk factors for hepatitis C.  Sexually Transmitted Diseases (STDs)  You should be screened each year for STDs including gonorrhea and chlamydia if: ? You are sexually active and are younger than 68 years of age. ? You are older than 68 years of age and your health care provider tells you that you are at risk for this type of infection. ? Your sexual activity has changed since you were last screened and you are at an increased risk for chlamydia or gonorrhea. Ask your health care provider if you are at risk.  Talk with your health care provider about whether you are at high risk of being infected with HIV. Your health care provider may recommend a prescription medicine to help prevent HIV infection.  What else can I do?  Schedule regular health, dental, and eye exams.  Stay current with your vaccines (immunizations).  Do not use any tobacco products, such as cigarettes, chewing tobacco, and e-cigarettes. If you need help quitting, ask your health care  provider.  Limit alcohol intake to no more than 2 drinks per day. One drink equals 12 ounces of beer, 5 ounces of wine, or 1 ounces of hard liquor.  Do not use street drugs.  Do not share needles.  Ask your health care provider for help if you need support or information about quitting drugs.  Tell your health care provider if you often feel depressed.  Tell your health care provider if you have ever been abused or do not feel safe at home. This information is not intended to replace advice given to you by your health care provider. Make sure you discuss any questions you have with your health care provider. Document Released: 11/08/2007 Document Revised: 01/09/2016 Document Reviewed: 02/13/2015 Elsevier Interactive Patient Education  [employer].

## 2017-07-23 NOTE — Assessment & Plan Note (Signed)
Pt's PE WNL w/ exception of obesity.  UTD on colonoscopy, immunizations.  Pt declines GU exam today- will get PSA.  Check labs.  Anticipatory guidance provided.

## 2017-07-24 ENCOUNTER — Other Ambulatory Visit (INDEPENDENT_AMBULATORY_CARE_PROVIDER_SITE_OTHER): Payer: PPO

## 2017-07-24 DIAGNOSIS — R7309 Other abnormal glucose: Secondary | ICD-10-CM

## 2017-07-24 HISTORY — PX: SHOULDER ARTHROSCOPY: SHX128

## 2017-07-24 LAB — HEMOGLOBIN A1C: Hgb A1c MFr Bld: 6.1 % (ref 4.6–6.5)

## 2017-07-27 ENCOUNTER — Telehealth: Payer: Self-pay | Admitting: Family Medicine

## 2017-07-27 NOTE — Telephone Encounter (Signed)
Copied from [place] [id number]. Topic: General - Other >> Jul 27, 2017 10:02 AM Cecelia Byars, NT wrote: Reason for CRM: Patient called and said during AWV he was told to schedule an eye dr visit ,he wants to make sure this is coverd by [employer] please call him  [phone]

## 2017-07-27 NOTE — Telephone Encounter (Signed)
Spoke with patient.  He is going to contact his insurance company and find out with his vision plan is and he is going to find out if a referral is needed.  If so, he will call us back to place this.

## 2017-07-27 NOTE — Telephone Encounter (Signed)
We can place a referral but I am not sure if this is covered.  He would need to check with his insurance

## 2017-08-08 ENCOUNTER — Other Ambulatory Visit: Payer: Self-pay | Admitting: Family Medicine

## 2017-08-18 DIAGNOSIS — H2513 Age-related nuclear cataract, bilateral: Secondary | ICD-10-CM | POA: Diagnosis not present

## 2017-08-19 ENCOUNTER — Encounter: Payer: Self-pay | Admitting: General Practice

## 2017-08-19 ENCOUNTER — Encounter: Payer: Self-pay | Admitting: Family Medicine

## 2017-08-19 ENCOUNTER — Ambulatory Visit (INDEPENDENT_AMBULATORY_CARE_PROVIDER_SITE_OTHER): Payer: PPO | Admitting: Family Medicine

## 2017-08-19 ENCOUNTER — Other Ambulatory Visit: Payer: Self-pay

## 2017-08-19 VITALS — BP 128/82 | HR 63 | Temp 98.0°F | Resp 16 | Ht 68.0 in | Wt 252.1 lb

## 2017-08-19 DIAGNOSIS — I10 Essential (primary) hypertension: Secondary | ICD-10-CM | POA: Diagnosis not present

## 2017-08-19 LAB — BASIC METABOLIC PANEL
BUN: 13 mg/dL (ref 6–23)
CALCIUM: 9 mg/dL (ref 8.4–10.5)
CHLORIDE: 106 meq/L (ref 96–112)
CO2: 29 mEq/L (ref 19–32)
Creatinine, Ser: 0.9 mg/dL (ref 0.40–1.50)
GFR: 89.14 mL/min (ref >60.00)
Glucose, Bld: 160 mg/dL — ABNORMAL HIGH (ref 70–99)
POTASSIUM: 3.8 meq/L (ref 3.5–5.1)
SODIUM: 142 meq/L (ref 135–145)

## 2017-08-19 MED ORDER — TELMISARTAN 80 MG PO TABS: 80.0000 mg | ORAL_TABLET | Freq: Every day | ORAL | 1 refills | Status: DC

## 2017-08-19 NOTE — Progress Notes (Signed)
   Subjective:    Patient ID: [name], male    DOB: 08/23/1950, 67 y.o.   MRN: [id number]  HPI HTN- chronic problem, at last visit Micardis was doubled to 80mg  daily due to elevated BP.  BP is much better today.  No CP, SOB, HAs, visual changes, edema.   Review of Systems For ROS see HPI     Objective:   Physical Exam  Constitutional: He is oriented to person, place, and time. He appears well-developed and well-nourished. No distress.  HENT:  Head: Normocephalic and atraumatic.  Eyes: Pupils are equal, round, and reactive to light. Conjunctivae and EOM are normal.  Neck: Normal range of motion. Neck supple. No thyromegaly present.  Cardiovascular: Normal rate, regular rhythm, normal heart sounds and intact distal pulses.  No murmur heard. Pulmonary/Chest: Effort normal and breath sounds normal. No respiratory distress.  Abdominal: Soft. Bowel sounds are normal. He exhibits no distension.  Musculoskeletal: He exhibits no edema.  Lymphadenopathy:    He has no cervical adenopathy.  Neurological: He is alert and oriented to person, place, and time. No cranial nerve deficit.  Skin: Skin is warm and dry.  Psychiatric: He has a normal mood and affect. His behavior is normal.  Vitals reviewed.         Assessment & Plan:

## 2017-08-19 NOTE — Patient Instructions (Signed)
Follow up in August to recheck BP and cholesterol We'll notify you of your lab results and make any changes if needed Continue to work on healthy diet and regular exercise- you can do it!! Call with any questions or concerns Happy Spring!!!

## 2017-08-19 NOTE — Assessment & Plan Note (Signed)
Chronic problem.  Well controlled today.  Asymptomatic.  Since we doubled his ARB dose, will get BMP.  90 day supply sent to pharmacy.  Will follow.

## 2017-08-31 ENCOUNTER — Encounter: Payer: Self-pay | Admitting: Family Medicine

## 2017-09-01 MED ORDER — FLUTICASONE-SALMETEROL 250-50 MCG/DOSE IN AEPB: INHALATION_SPRAY | RESPIRATORY_TRACT | 6 refills | Status: DC

## 2017-09-16 ENCOUNTER — Other Ambulatory Visit: Payer: Self-pay | Admitting: Family Medicine

## 2017-09-21 ENCOUNTER — Ambulatory Visit (INDEPENDENT_AMBULATORY_CARE_PROVIDER_SITE_OTHER): Payer: PPO | Admitting: Orthopaedic Surgery

## 2017-09-21 ENCOUNTER — Other Ambulatory Visit (INDEPENDENT_AMBULATORY_CARE_PROVIDER_SITE_OTHER): Payer: Self-pay

## 2017-09-21 ENCOUNTER — Encounter (INDEPENDENT_AMBULATORY_CARE_PROVIDER_SITE_OTHER): Payer: Self-pay | Admitting: Orthopaedic Surgery

## 2017-09-21 DIAGNOSIS — G8929 Other chronic pain: Secondary | ICD-10-CM

## 2017-09-21 DIAGNOSIS — M25512 Pain in left shoulder: Principal | ICD-10-CM

## 2017-09-21 NOTE — Progress Notes (Signed)
The patient is well-known to me.  We have seen him numerous times for his left shoulder.  He has had multiple injections in subacromial space and glenohumeral joint with x-rays that only show some mild AC joint arthritis.  His back where shoulder is hurting him with overhead activities and reaching behind him and reaching away from him.  Is slowly gotten worse again.  We have again put him through multiple injections as well as outpatient physical therapy and is just not getting better.  On exam he lacks full abduction as well as external rotation.  His internal rotation abduction is limited using all of his deltoid to abduct his shoulder.  Previous films show no significant arthritic changes other than some AC joint arthritis.  The glenohumeral joint is well located.  At this point given the fact that he is failed multiple injections of the left shoulder, he has failed physical therapy, he has failed anti-inflammatories and even home exercise program as well as rest, ice, heat and time and MRI is warranted at this point of the left shoulder to assess the cartilage in the rotator cuff.  He agrees with this as well.  We will see him back after the MRI is obtained.

## 2017-09-28 ENCOUNTER — Ambulatory Visit
Admission: RE | Admit: 2017-09-28 | Discharge: 2017-09-28 | Disposition: A | Discharge status: Home / Self Care | Payer: PPO | Source: Ambulatory Visit | Attending: Orthopaedic Surgery | Admitting: Orthopaedic Surgery

## 2017-09-28 DIAGNOSIS — M25512 Pain in left shoulder: Principal | ICD-10-CM

## 2017-09-28 DIAGNOSIS — G8929 Other chronic pain: Secondary | ICD-10-CM

## 2017-09-28 DIAGNOSIS — M7522 Bicipital tendinitis, left shoulder: Secondary | ICD-10-CM | POA: Diagnosis not present

## 2017-10-05 ENCOUNTER — Ambulatory Visit (INDEPENDENT_AMBULATORY_CARE_PROVIDER_SITE_OTHER): Payer: PPO | Admitting: Orthopaedic Surgery

## 2017-10-05 ENCOUNTER — Encounter (INDEPENDENT_AMBULATORY_CARE_PROVIDER_SITE_OTHER): Payer: Self-pay | Admitting: Orthopaedic Surgery

## 2017-10-05 DIAGNOSIS — G8929 Other chronic pain: Secondary | ICD-10-CM

## 2017-10-05 DIAGNOSIS — M7542 Impingement syndrome of left shoulder: Secondary | ICD-10-CM

## 2017-10-05 DIAGNOSIS — M25512 Pain in left shoulder: Secondary | ICD-10-CM | POA: Diagnosis not present

## 2017-10-05 NOTE — Progress Notes (Signed)
The patient returns for follow-up after having an MRI of his left shoulder.  He has had worsening shoulder pain and decreased motion of the shoulder.  He has been to physical therapy as well as had an intra-articular glenohumeral joint steroid injection and a subacromial steroid injection.  On exam he shows signs of a frozen shoulder.  He has limited abduction and external rotation.  He has limited internal rotation with adduction as well.  He has significant pain at the [facility] joint as well as the subacromial outlet.  MRI is seen and does show evidence of adhesive capsulitis of the shoulder.  There is mild to moderate glenohumeral and AC joint arthritis.  The acromion is only type I and the rotator cuff is entirely intact with tendinosis.  Given the worsening shoulder function combined with his MRI findings I do feel that he needs a manipulation under anesthesia of his left shoulder as well as an arthroscopic subacromial decompression and extensive debridement.  I explained what this involves in detail showing him a shoulder model and go over the MRI with him.  Given the failure of all forms of conservative treatment including intra-articular injections as well as rest, ice, heat, anti-inflammatories and extensive physical therapy this would be the next step for him.  All questions concerns were answered and addressed.  I had a long and thorough discussion with him about the risk and benefits of this type of surgery as well and what it involves.  We will work on getting this scheduled and we will see him back in 1 week postoperative for suture removal.

## 2017-10-08 ENCOUNTER — Encounter: Payer: Self-pay | Admitting: Orthopaedic Surgery

## 2017-10-08 DIAGNOSIS — M7542 Impingement syndrome of left shoulder: Secondary | ICD-10-CM | POA: Diagnosis not present

## 2017-10-08 DIAGNOSIS — M24612 Ankylosis, left shoulder: Secondary | ICD-10-CM | POA: Diagnosis not present

## 2017-10-08 DIAGNOSIS — M24112 Other articular cartilage disorders, left shoulder: Secondary | ICD-10-CM | POA: Diagnosis not present

## 2017-10-08 DIAGNOSIS — M659 Synovitis and tenosynovitis, unspecified: Secondary | ICD-10-CM | POA: Diagnosis not present

## 2017-10-08 DIAGNOSIS — M19012 Primary osteoarthritis, left shoulder: Secondary | ICD-10-CM | POA: Diagnosis not present

## 2017-10-08 DIAGNOSIS — G8918 Other acute postprocedural pain: Secondary | ICD-10-CM | POA: Diagnosis not present

## 2017-10-15 ENCOUNTER — Ambulatory Visit (INDEPENDENT_AMBULATORY_CARE_PROVIDER_SITE_OTHER): Payer: PPO | Admitting: Orthopaedic Surgery

## 2017-10-15 ENCOUNTER — Encounter (INDEPENDENT_AMBULATORY_CARE_PROVIDER_SITE_OTHER): Payer: Self-pay | Admitting: Orthopaedic Surgery

## 2017-10-15 DIAGNOSIS — Z9889 Other specified postprocedural states: Secondary | ICD-10-CM

## 2017-10-15 NOTE — Progress Notes (Signed)
Patient is following up 1 week after a left shoulder arthroscopy with extensive debridement and subacromial decompression.  We performed a manipulation under anesthesia as well and debrided the AC joint.  He had significant arthrofibrosis and inflamed tissue with synovitis throughout the shoulder.  His cartilage looked good overall and his rotator cuff was intact.  He is doing well postoperative with not much pain but in need of therapy to increase his shoulder function and motion.  On exam I removed all sutures and they look good.  He has a significant bruising around his left shoulder.  He still has a tight shoulder with difficulty with overhead activity.  I gave him a prescription for outpatient physical therapy and essentially get his shoulder moving.  All questions concerns were answered and addressed.  We will see him back in a month see how shoulder function is doing on the left shoulder.

## 2017-10-23 DIAGNOSIS — M7502 Adhesive capsulitis of left shoulder: Secondary | ICD-10-CM | POA: Diagnosis not present

## 2017-10-27 DIAGNOSIS — M7502 Adhesive capsulitis of left shoulder: Secondary | ICD-10-CM | POA: Diagnosis not present

## 2017-11-02 DIAGNOSIS — M7502 Adhesive capsulitis of left shoulder: Secondary | ICD-10-CM | POA: Diagnosis not present

## 2017-11-03 ENCOUNTER — Encounter: Payer: Self-pay | Admitting: Family Medicine

## 2017-11-03 MED ORDER — MECLIZINE HCL 25 MG PO TABS: 25.0000 mg | ORAL_TABLET | Freq: Three times a day (TID) | ORAL | 0 refills | Status: DC | PRN

## 2017-11-10 DIAGNOSIS — M7502 Adhesive capsulitis of left shoulder: Secondary | ICD-10-CM | POA: Diagnosis not present

## 2017-11-12 ENCOUNTER — Encounter (INDEPENDENT_AMBULATORY_CARE_PROVIDER_SITE_OTHER): Payer: Self-pay | Admitting: Orthopaedic Surgery

## 2017-11-12 ENCOUNTER — Ambulatory Visit (INDEPENDENT_AMBULATORY_CARE_PROVIDER_SITE_OTHER): Payer: PPO | Admitting: Orthopaedic Surgery

## 2017-11-12 DIAGNOSIS — Z9889 Other specified postprocedural states: Secondary | ICD-10-CM

## 2017-11-12 NOTE — Progress Notes (Signed)
Patient is 5 weeks status post a left shoulder arthroscopy with extensive debridement and subacromial decompression with manipulation as well.  He is going through extensive physical therapy on the shoulder as well.  He feels like he is slowly making progress.  He would like to be further along that he has now but he feels like he is getting there.  He says his pain is subsiding as well.  On exam, he still has some stiffness in that left shoulder and is unable to fully abduct his shoulder.  We will continue to transition to home exercise program.  I do feel that we should see him back one more time in a month.  He may benefit from an intra-articular steroid injection however he is not having a lot of pain.  We will see how he is doing 4 weeks from now.  All question concerns were answered and addressed.

## 2017-11-18 DIAGNOSIS — M7502 Adhesive capsulitis of left shoulder: Secondary | ICD-10-CM | POA: Diagnosis not present

## 2017-11-23 ENCOUNTER — Encounter: Payer: Self-pay | Admitting: Family Medicine

## 2017-11-23 ENCOUNTER — Ambulatory Visit (INDEPENDENT_AMBULATORY_CARE_PROVIDER_SITE_OTHER): Payer: PPO | Admitting: Family Medicine

## 2017-11-23 ENCOUNTER — Other Ambulatory Visit: Payer: Self-pay

## 2017-11-23 VITALS — BP 124/80 | HR 80 | Temp 98.1°F | Resp 16 | Ht 68.0 in | Wt 245.0 lb

## 2017-11-23 DIAGNOSIS — T679XXA Effect of heat and light, unspecified, initial encounter: Secondary | ICD-10-CM | POA: Diagnosis not present

## 2017-11-23 MED ORDER — ONDANSETRON HCL 4 MG PO TABS: 4.0000 mg | ORAL_TABLET | Freq: Three times a day (TID) | ORAL | 0 refills | Status: DC | PRN

## 2017-11-23 NOTE — Progress Notes (Signed)
   Subjective:    Patient ID: Taylor Hua., male    DOB: 04-08-1951, 67 y.o.   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  Was on the field until ~3pm (temp>90 degrees).  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  No vomiting.  Denies HA.  Was very warm to the touch.  + dizziness.  Pt ate a 'few chips and a candy bar' but was unable to finish last game.  Had no appetite when he returned home.  Still lacks appetite.  Continues to have nausea.  Limited fluids today.  Dizziness has improved.   Review of Systems For ROS see HPI     Objective:   Physical Exam  Constitutional: He is oriented to person, place, and time. He appears well-developed and well-nourished. No distress.  obese  HENT:  Head: Normocephalic and atraumatic.  Eyes: Pupils are equal, round, and reactive to light. Conjunctivae and EOM are normal.  Neck: Normal range of motion. Neck supple. No thyromegaly present.  Cardiovascular: Normal rate, regular rhythm, normal heart sounds and intact distal pulses.  No murmur heard. Pulmonary/Chest: Effort normal and breath sounds normal. No respiratory distress.  Abdominal: Soft. Bowel sounds are normal. He exhibits no distension.  Musculoskeletal: He exhibits no edema.  Lymphadenopathy:    He has no cervical adenopathy.  Neurological: He is alert and oriented to person, place, and time. No cranial nerve deficit.  Skin: Skin is warm and dry.  Good skin turgor and rapid cap refill  Psychiatric: He has a normal mood and affect. His behavior is normal.  Vitals reviewed.         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  Stressed need for adequate hydration- no signs of dehydration on exam today- and need to eat regularly to avoid low blood sugar.  Start Zofran prn nausea.  Reviewed supportive care and red  flags that should prompt return.  Pt expressed understanding and is in agreement w/ plan.

## 2017-11-23 NOTE — Patient Instructions (Signed)
Follow up as scheduled or as needed INCREASE your fluid intake- water, gatorade, powerade- avoid caffeine (this will dehydrate you even more) Make sure you are eating throughout the day.  It doesn't have to be a big meal but you don't want your sugar to drop again Use the Ondansetron as needed for nausea Call with any questions or concerns Hang in there!

## 2017-11-24 ENCOUNTER — Encounter: Payer: Self-pay | Admitting: Family Medicine

## 2017-11-25 NOTE — Telephone Encounter (Signed)
Patient called and states he was returning a missed call.  Per Dr. Birdie Riddle Mychart message patient need an appt . Patient states he wanted to see Dr Birdie Riddle this week .  She does not have any available opening on Friday. Patient prefer not to wait until next week. Please call to schedule an appt. CB# [phone]

## 2017-11-25 NOTE — Telephone Encounter (Signed)
Spoke with patient regarding symptoms. Pt reports low back pain, currently pain of 1:10 of pain scale. Denies blood or pain with urination. Denies N/V at this time. Pt scheduled to see Elyn Aquas, PA-C on Friday, 11/27/2017. Advised to go to UC or ER with severe pain or other symptoms occur, verbalized understanding.

## 2017-11-27 ENCOUNTER — Other Ambulatory Visit: Payer: Self-pay

## 2017-11-27 ENCOUNTER — Ambulatory Visit (INDEPENDENT_AMBULATORY_CARE_PROVIDER_SITE_OTHER): Payer: PPO | Admitting: Physician Assistant

## 2017-11-27 ENCOUNTER — Encounter: Payer: Self-pay | Admitting: Physician Assistant

## 2017-11-27 VITALS — BP 128/70 | HR 70 | Temp 98.1°F | Resp 16 | Ht 68.0 in | Wt 249.0 lb

## 2017-11-27 DIAGNOSIS — R109 Unspecified abdominal pain: Secondary | ICD-10-CM | POA: Diagnosis not present

## 2017-11-27 DIAGNOSIS — R058 Other specified cough: Secondary | ICD-10-CM

## 2017-11-27 DIAGNOSIS — H6983 Other specified disorders of Eustachian tube, bilateral: Secondary | ICD-10-CM | POA: Diagnosis not present

## 2017-11-27 DIAGNOSIS — R05 Cough: Secondary | ICD-10-CM | POA: Diagnosis not present

## 2017-11-27 LAB — POCT URINALYSIS DIPSTICK
Bilirubin, UA: 1
Blood, UA: NEGATIVE
GLUCOSE UA: NEGATIVE
KETONES UA: NEGATIVE
Leukocytes, UA: NEGATIVE
NITRITE UA: NEGATIVE
Protein, UA: NEGATIVE
Spec Grav, UA: 1.025 (ref 1.010–1.025)
Urobilinogen, UA: 1 E.U./dL
pH, UA: 6 (ref 5.0–8.0)

## 2017-11-27 MED ORDER — FLUTICASONE PROPIONATE 50 MCG/ACT NA SUSP: 2.0000 | Freq: Every day | NASAL | 6 refills | Status: DC

## 2017-11-27 MED ORDER — BENZONATATE 100 MG PO CAPS: 100.0000 mg | ORAL_CAPSULE | Freq: Three times a day (TID) | ORAL | 0 refills | Status: DC | PRN

## 2017-11-27 NOTE — Patient Instructions (Signed)
Please keep well-hydrated and get plenty of rest. For the ears/cough, use the Flonase daily as directed. A daily saline nasal rinse will also be helpful. Use the Tessalon as directed for cough.  Symptoms should improve and resolve from this point forward. If not, please call or come see [place].   For the back pain, the urine is unremarkable for blood today. We are getting an x-ray to assess for stone.  Limit heavy lifting or overexertion. We will alter regimen as soon as we have results.   IF there is any acute worsening of pain before workup is complete, please go to the ER.

## 2017-11-27 NOTE — Progress Notes (Signed)
Patient presents to clinic today c/o left sided flank pain starting last week that is intermittent and associated with mild nausea. Denies any dysuria, hematuria, urinary urgency or frequency. Has + history of calcium kidney stone; last one in 2013/2014. Patient denies any trauma or injury. Notes some radiation of pain around to left side. Denies all/agr factors. Was recently seen by PCP for dehydration. 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   Patient also noting dry cough over the past couple of weeks. Did note ear pressure and popping that has improved. Denies chest congestion, chest pain or SOB. Denies fever, chills, recent travel or sick contact. Symptoms improving but still present. He denies any LUQ pain, heart burn or globus sensation.  Past Medical History:  Diagnosis Date  . Allergy   . Anxiety   . Arthritis   . Chronic kidney disease    hx kidney stones  . Depression   . GERD (gastroesophageal reflux disease)   . Hyperlipemia   . Hypertension   . Sleep apnea 2005   moderate-has cpap-not used in 3 yr    Current Outpatient Medications on File Prior to Visit  Medication Sig Dispense Refill  . aspirin 81 MG tablet Take 81 mg by mouth daily.      . citalopram (CELEXA) 20 MG tablet TAKE 1 TABLET BY MOUTH ONCE DAILY 30 tablet 3  . esomeprazole (NEXIUM) 40 MG capsule Take 40 mg by mouth daily.    . Fluticasone-Salmeterol (ADVAIR DISKUS) 250-50 MCG/DOSE AEPB INHALE ONE PUFF INTO LUNGS TWICE DAILY 60 each 6  . meclizine (ANTIVERT) 25 MG tablet Take 1 tablet (25 mg total) by mouth 3 (three) times daily as needed for dizziness. 45 tablet 0  . ondansetron (ZOFRAN) 4 MG tablet Take 1 tablet (4 mg total) by mouth every 8 (eight) hours as needed for nausea or vomiting. 20 tablet 0  . simvastatin (ZOCOR) 10 MG tablet TAKE 1 TABLET BY MOUTH ONCE DAILY 90 tablet 1  . telmisartan (MICARDIS) 80 MG tablet Take 1 tablet (80 mg total) by mouth daily. 90  tablet 1  . traZODone (DESYREL) 50 MG tablet TAKE 1/2 TO 1 (ONE-HALF TO ONE) TABLET BY MOUTH AT BEDTIME AS NEEDED FOR SLEEP 30 tablet 3   No current facility-administered medications on file prior to visit.     No Known Allergies  Family History  Problem Relation Age of Onset  . Diabetes Mother   . Pancreatitis Mother   . Heart disease Mother 20       MI, died with MI  . Stroke Father   . Hypertension Father   . Peripheral vascular disease Father        Carotid surgery  . Heart disease Sister        Arrhythmia  . Diabetes Sister 62       DM  . Kidney cancer Sister   . Diabetes Sister   . Cancer Sister        colon  . Heart disease Sister   . Diabetes Sister   . Heart disease Sister   . Stroke Brother   . Colon cancer Sister   . Pancreatic cancer Unknown     Social History   Socioeconomic History  . Marital status: Married    Spouse name: Not on file  . Number of children: 6  . Years of education: Not on file  . Highest education level: Not on file  Occupational History  .  Occupation: retired  Scientific laboratory technician  . Financial resource strain: Not on file  . Food insecurity:    Worry: Not on file    Inability: Not on file  . Transportation needs:    Medical: Not on file    Non-medical: Not on file  Tobacco Use  . Smoking status: Never Smoker  . Smokeless tobacco: Never Used  Substance and Sexual Activity  . Alcohol use: No  . Drug use: No  . Sexual activity: Yes    Partners: Female  Lifestyle  . Physical activity:    Days per week: Not on file    Minutes per session: Not on file  . Stress: Not on file  Relationships  . Social connections:    Talks on phone: Not on file    Gets together: Not on file    Attends religious service: Not on file    Active member of club or organization: Not on file    Attends meetings of clubs or organizations: Not on file    Relationship status: Not on file  Other Topics Concern  . Not on file  Social History Narrative    Exercise--walks a mile 5 days a week.  One deceased child age 57.  One stepchild, 5 adopted.    Review of Systems - See HPI.  All other ROS are negative.  BP 128/70   Pulse 70   Temp 98.1 F (36.7 C) (Oral)   Resp 16   Ht 5\' 8"  (1.727 m)   Wt 249 lb (112.9 kg)   SpO2 98%   BMI 37.86 kg/m   Physical Exam  Constitutional: He is oriented to person, place, and time. He appears well-developed and well-nourished.  HENT:  Head: Normocephalic and atraumatic.  Right Ear: External ear normal. A middle ear effusion (serous) is present.  Left Ear: External ear normal. Tympanic membrane is retracted. A middle ear effusion (serous) is present.  Nose: Nose normal.  Mouth/Throat: Oropharynx is clear and moist. No oropharyngeal exudate.  Eyes: Conjunctivae are normal.  Neck: Neck supple.  Cardiovascular: Normal rate, regular rhythm and normal heart sounds.  Pulmonary/Chest: Effort normal and breath sounds normal. No stridor. No respiratory distress. He has no wheezes. He has no rales. He exhibits no tenderness.  Abdominal: Soft. Bowel sounds are normal. He exhibits no distension.  Negative CVA tenderness  Musculoskeletal:       Thoracic back: Normal.       Lumbar back: Normal.  Neurological: He is alert and oriented to person, place, and time.  Psychiatric: He has a normal mood and affect.   Assessment/Plan: 1. Left flank pain Urine dip negative for sign of infection or blood. Will obtain KUB to assess for calcium stone. Supportive measures and pain control discussed. ER for acute worsening of symptoms before workup is complete.  - POCT urinalysis dipstick - DG Abd 1 View; Future  2. Dysfunction of both eustachian tubes Start Flonase and saline nasal rinses. Supportive measures reviewed. - fluticasone (FLONASE) 50 MCG/ACT nasal spray; Place 2 sprays into both nostrils daily.  Dispense: 16 g; Refill: 6  3. Post-viral cough syndrome Start Tessalon to help with resolving dry cough.  Follow-up if symptoms are not continuing to resolve.  - benzonatate (TESSALON) 100 MG capsule; Take 1 capsule (100 mg total) by mouth 3 (three) times daily as needed.  Dispense: 30 capsule; Refill: 0   Leeanne Rio, PA-C

## 2017-12-01 ENCOUNTER — Encounter (INDEPENDENT_AMBULATORY_CARE_PROVIDER_SITE_OTHER): Payer: Self-pay | Admitting: Orthopaedic Surgery

## 2017-12-01 DIAGNOSIS — M7502 Adhesive capsulitis of left shoulder: Secondary | ICD-10-CM | POA: Diagnosis not present

## 2017-12-08 DIAGNOSIS — M7502 Adhesive capsulitis of left shoulder: Secondary | ICD-10-CM | POA: Diagnosis not present

## 2017-12-10 ENCOUNTER — Other Ambulatory Visit: Payer: Self-pay | Admitting: Family Medicine

## 2017-12-14 ENCOUNTER — Encounter (INDEPENDENT_AMBULATORY_CARE_PROVIDER_SITE_OTHER): Payer: Self-pay | Admitting: Orthopaedic Surgery

## 2017-12-14 ENCOUNTER — Ambulatory Visit (INDEPENDENT_AMBULATORY_CARE_PROVIDER_SITE_OTHER): Payer: PPO | Admitting: Orthopaedic Surgery

## 2017-12-14 DIAGNOSIS — Z9889 Other specified postprocedural states: Secondary | ICD-10-CM

## 2017-12-14 NOTE — Progress Notes (Signed)
The patient is continue to follow-up status post a left shoulder arthroscopy with subacromial decompression and extensive debridement.  He is doing much better overall he states in terms of range of motion and strength but is not at the point where he feels that he should be.  On exam he still has significant stiffness with external rotation and abduction of the left shoulder and limitations with reaching full motion.  At this point I do feel that he would benefit from an intra-articular steroid injection in the left glenohumeral joint by Dr. Ernestina Patches.  The patient actually had this some time ago by Dr. Ernestina Patches and got about 3 months of pain relief.  I think it is important at this point to have this injection to see if this will help with pain relief to improve his shoulder function overall.  The patient agrees with this as well.  We will work on getting that injection set up under direct fluoroscopy by Dr. Ernestina Patches and to the left shoulder.  I will see him back myself in about 4 weeks to see how is doing overall.  All questions concerns were answered and addressed.

## 2017-12-15 ENCOUNTER — Other Ambulatory Visit (INDEPENDENT_AMBULATORY_CARE_PROVIDER_SITE_OTHER): Payer: Self-pay

## 2017-12-15 DIAGNOSIS — G8929 Other chronic pain: Secondary | ICD-10-CM

## 2017-12-15 DIAGNOSIS — M25512 Pain in left shoulder: Principal | ICD-10-CM

## 2017-12-22 DIAGNOSIS — M7502 Adhesive capsulitis of left shoulder: Secondary | ICD-10-CM | POA: Diagnosis not present

## 2017-12-23 ENCOUNTER — Encounter (INDEPENDENT_AMBULATORY_CARE_PROVIDER_SITE_OTHER): Payer: Self-pay | Admitting: Physical Medicine and Rehabilitation

## 2017-12-23 ENCOUNTER — Ambulatory Visit (INDEPENDENT_AMBULATORY_CARE_PROVIDER_SITE_OTHER): Payer: Self-pay

## 2017-12-23 ENCOUNTER — Ambulatory Visit (INDEPENDENT_AMBULATORY_CARE_PROVIDER_SITE_OTHER): Payer: PPO | Admitting: Physical Medicine and Rehabilitation

## 2017-12-23 DIAGNOSIS — G8929 Other chronic pain: Secondary | ICD-10-CM | POA: Diagnosis not present

## 2017-12-23 DIAGNOSIS — M25512 Pain in left shoulder: Secondary | ICD-10-CM

## 2017-12-23 NOTE — Progress Notes (Signed)
Taylor Harvey. - 67 y.o. male MRN [id number]  Date of birth: 1951/04/12  Office Visit Note: Visit Date: 12/23/2017 PCP: Midge Minium, MD Referred by: Midge Minium, MD  Subjective: Chief Complaint  Patient presents with  . Left Shoulder - Pain  . Left Arm - Pain   HPI: Mr. Taylor Harvey is a 67 year old gentleman followed by Dr. Ninfa Linden in our office who is status post prior left shoulder/glenohumeral joint injection they gave him 3 months of relief.  He ended up having shoulder arthroscopy and now is having continued pain.  We are going to complete diagnostic of therapeutic left glenohumeral joint injection with fluoroscopic guidance.   ROS Otherwise per HPI.  Assessment & Plan: Visit Diagnoses: No diagnosis found.  Plan: No additional findings.   Meds & Orders: No orders of the defined types were placed in this encounter.  No orders of the defined types were placed in this encounter.   Follow-up: No follow-ups on file.   Procedures: Large Joint Inj: L glenohumeral on 12/23/2017 10:28 AM Indications: pain and diagnostic evaluation Details: 22 G 3.5 in needle, fluoroscopy-guided anteromedial approach  Arthrogram: No  Medications: 3 mL bupivacaine 0.5 %; 80 mg triamcinolone acetonide 40 MG/ML Outcome: tolerated well, no immediate complications  There was excellent flow of contrast producing a partial arthrogram of the glenohumeral joint. The patient did have relief of symptoms during the anesthetic phase of the injection. Procedure, treatment alternatives, risks and benefits explained, specific risks discussed. Consent was given by the patient. Immediately prior to procedure a time out was called to verify the correct patient, procedure, equipment, support staff and site/side marked as required. Patient was prepped and draped in the usual sterile fashion.      No notes on file   Clinical History: No specialty comments available.   He reports that he has  never smoked. He has never used smokeless tobacco.  Recent Labs    02/17/17 1537 07/24/17 1308  HGBA1C 6.2 6.1    Objective:  VS:  HT:    WT:   BMI:     BP:   HR: bpm  TEMP: ( )  RESP:  Physical Exam  Ortho Exam Imaging: No results found.  Past Medical/Family/Surgical/Social History: Medications & Allergies reviewed per EMR, new medications updated. Patient Active Problem List   Diagnosis Date Noted  . Status post arthroscopy of left shoulder 10/15/2017  . Lateral epicondylitis, left elbow 04/08/2017  . Chronic left shoulder pain 02/17/2017  . Pain in left elbow 02/17/2017  . Left upper arm pain 01/20/2017  . Insomnia 07/19/2014  . Urinary frequency 07/19/2014  . Physical exam 07/19/2014  . Obesity (BMI 30-39.9) 01/20/2012  . Hyperlipidemia 11/24/2008  . ANXIETY STATE, UNSPECIFIED 06/02/2008  . NEOPLASM, SKIN, UNCERTAIN BEHAVIOR 78/29/5621  . TRIGGER FINGER 03/02/2008  . BACK PAIN WITH RADICULOPATHY 03/02/2008  . BACK PAIN 07/15/2007  . DYSPNEA ON EXERTION 11/09/2006  . POLYP, COLON 07/19/2006  . Essential hypertension 07/19/2006  . Sleep apnea 07/19/2006  . DIVERTICULOSIS, ASYMPTOMATIC 06/11/2006   Past Medical History:  Diagnosis Date  . Allergy   . Anxiety   . Arthritis   . Chronic kidney disease    hx kidney stones  . Depression   . GERD (gastroesophageal reflux disease)   . Hyperlipemia   . Hypertension   . Sleep apnea 2005   moderate-has cpap-not used in 3 yr   Family History  Problem Relation Age of Onset  . Diabetes Mother   .  Pancreatitis Mother   . Heart disease Mother 31       MI, died with MI  . Stroke Father   . Hypertension Father   . Peripheral vascular disease Father        Carotid surgery  . Heart disease Sister        Arrhythmia  . Diabetes Sister 20       DM  . Kidney cancer Sister   . Diabetes Sister   . Cancer Sister        colon  . Heart disease Sister   . Diabetes Sister   . Heart disease Sister   . Stroke Brother    . Colon cancer Sister   . Pancreatic cancer Unknown    Past Surgical History:  Procedure Laterality Date  . COLONOSCOPY    . INSERTION OF MESH  05/05/2012   Procedure: INSERTION OF MESH;  Surgeon: Rolm Bookbinder, MD;  Location: [facility];  Service: General;  Laterality: N/A;  . LITHOTRIPSY     x2  . trigger finger surgery  2006  . UMBILICAL HERNIA REPAIR  05/05/2012   Procedure: HERNIA REPAIR UMBILICAL ADULT;  Surgeon: Rolm Bookbinder, MD;  Location: [facility];  Service: General;  Laterality: N/A;   Social History   Occupational History  . Occupation: retired  Tobacco Use  . Smoking status: Never Smoker  . Smokeless tobacco: Never Used  Substance and Sexual Activity  . Alcohol use: No  . Drug use: No  . Sexual activity: Yes    Partners: Female

## 2017-12-23 NOTE — Progress Notes (Signed)
 .  Numeric Pain Rating Scale and Functional Assessment Average Pain 7   In the last MONTH (on 0-10 scale) has pain interfered with the following?  1. General activity like being  able to carry out your everyday physical activities such as walking, climbing stairs, carrying groceries, or moving a chair?  Rating(5)  -Dye Allergies.  

## 2017-12-29 ENCOUNTER — Other Ambulatory Visit: Payer: Self-pay | Admitting: Family Medicine

## 2018-01-11 ENCOUNTER — Ambulatory Visit (INDEPENDENT_AMBULATORY_CARE_PROVIDER_SITE_OTHER): Payer: PPO | Admitting: Orthopaedic Surgery

## 2018-01-12 MED ORDER — TRIAMCINOLONE ACETONIDE 40 MG/ML IJ SUSP
80.0000 mg | INTRAMUSCULAR | Status: AC | PRN
  Administered 2017-12-23: 80 mg via INTRA_ARTICULAR

## 2018-01-12 MED ORDER — BUPIVACAINE HCL 0.5 % IJ SOLN
3.0000 mL | INTRAMUSCULAR | Status: AC | PRN
  Administered 2017-12-23: 3 mL via INTRA_ARTICULAR

## 2018-01-14 ENCOUNTER — Encounter: Payer: Self-pay | Admitting: Family Medicine

## 2018-01-14 ENCOUNTER — Ambulatory Visit (INDEPENDENT_AMBULATORY_CARE_PROVIDER_SITE_OTHER): Payer: PPO | Admitting: Family Medicine

## 2018-01-14 ENCOUNTER — Other Ambulatory Visit: Payer: Self-pay

## 2018-01-14 VITALS — BP 136/78 | HR 70 | Temp 97.8°F | Resp 15 | Ht 68.0 in | Wt 244.4 lb

## 2018-01-14 DIAGNOSIS — Z23 Encounter for immunization: Secondary | ICD-10-CM

## 2018-01-14 DIAGNOSIS — E785 Hyperlipidemia, unspecified: Secondary | ICD-10-CM | POA: Diagnosis not present

## 2018-01-14 DIAGNOSIS — I10 Essential (primary) hypertension: Secondary | ICD-10-CM

## 2018-01-14 DIAGNOSIS — D229 Melanocytic nevi, unspecified: Secondary | ICD-10-CM | POA: Diagnosis not present

## 2018-01-14 LAB — HEPATIC FUNCTION PANEL
ALBUMIN: 4.1 g/dL (ref 3.5–5.2)
ALT: 26 U/L (ref 0–53)
AST: 16 U/L (ref 0–37)
Alkaline Phosphatase: 96 U/L (ref 39–117)
Bilirubin, Direct: 0.1 mg/dL (ref 0.0–0.3)
TOTAL PROTEIN: 6.3 g/dL (ref 6.0–8.3)
Total Bilirubin: 0.5 mg/dL (ref 0.2–1.2)

## 2018-01-14 LAB — BASIC METABOLIC PANEL
BUN: 12 mg/dL (ref 6–23)
CHLORIDE: 106 meq/L (ref 96–112)
CO2: 29 mEq/L (ref 19–32)
Calcium: 9.5 mg/dL (ref 8.4–10.5)
Creatinine, Ser: 1.03 mg/dL (ref 0.40–1.50)
GFR: 76.19 mL/min (ref >60.00)
Glucose, Bld: 114 mg/dL — ABNORMAL HIGH (ref 70–99)
POTASSIUM: 5 meq/L (ref 3.5–5.1)
Sodium: 142 mEq/L (ref 135–145)

## 2018-01-14 LAB — CBC WITH DIFFERENTIAL/PLATELET
BASOS ABS: 0 10*3/uL (ref 0.0–0.1)
Basophils Relative: 0.7 % (ref 0.0–3.0)
Eosinophils Absolute: 0.1 10*3/uL (ref 0.0–0.7)
Eosinophils Relative: 3 % (ref 0.0–5.0)
HEMATOCRIT: 44.9 % (ref 39.0–52.0)
HEMOGLOBIN: 15.1 g/dL (ref 13.0–17.0)
LYMPHS PCT: 30.9 % (ref 12.0–46.0)
Lymphs Abs: 1.4 10*3/uL (ref 0.7–4.0)
MCHC: 33.7 g/dL (ref 30.0–36.0)
MCV: 94.4 fl (ref 78.0–100.0)
MONO ABS: 0.5 10*3/uL (ref 0.1–1.0)
Monocytes Relative: 9.7 % (ref 3.0–12.0)
Neutro Abs: 2.6 10*3/uL (ref 1.4–7.7)
Neutrophils Relative %: 55.7 % (ref 43.0–77.0)
Platelets: 172 10*3/uL (ref 150.0–400.0)
RBC: 4.75 Mil/uL (ref 4.22–5.81)
RDW: 13.9 % (ref 11.5–15.5)
WBC: 4.6 10*3/uL (ref 4.0–10.5)

## 2018-01-14 LAB — LIPID PANEL
CHOL/HDL RATIO: 3
Cholesterol: 110 mg/dL (ref 0–200)
HDL: 34 mg/dL — ABNORMAL LOW (ref >39.00)
LDL CALC: 53 mg/dL (ref 0–99)
NonHDL: 76.42
TRIGLYCERIDES: 118 mg/dL (ref 0.0–149.0)
VLDL: 23.6 mg/dL (ref 0.0–40.0)

## 2018-01-14 LAB — TSH: TSH: 0.91 u[IU]/mL (ref 0.35–4.50)

## 2018-01-14 NOTE — Assessment & Plan Note (Signed)
Pt's BMI of 37.2 w/ comorbidities of HTN and hyperlipidemia qualify as morbidly obese.  Stressed need for healthy diet and regular exercise.  Applauded his 5 lb weight loss.  Will follow.

## 2018-01-14 NOTE — Assessment & Plan Note (Signed)
Chronic problem.  Adequate control.  Asymptomatic.  Check labs.  No anticipated med changes.  Will follow. 

## 2018-01-14 NOTE — Progress Notes (Signed)
   Subjective:    Patient ID: Taylor Hua., male    DOB: 1951-02-11, 67 y.o.   MRN: [id number]  HPI HTN- chronic problem, on Telmisartan 80mg  daily w/ adequate control.  No CP, SOB, HAs, visual changes, edema.  Hyperlipidemia- chronic problem, on Simvastatin 10mg  daily.  Walking regularly, is cutting back on soda.  No abd pain, N/V.  Obesity- chronic problem.  BMI is 37.2 w/ HTN and hyperlipidemia qualifying as morbid obesity.  Down 5 lbs since 7/5.   Review of Systems For ROS see HPI     Objective:   Physical Exam  Constitutional: He is oriented to person, place, and time. He appears well-developed and well-nourished. No distress.  obese  HENT:  Head: Normocephalic and atraumatic.  Eyes: Pupils are equal, round, and reactive to light. Conjunctivae and EOM are normal.  Neck: Normal range of motion. Neck supple. No thyromegaly present.  Cardiovascular: Normal rate, regular rhythm, normal heart sounds and intact distal pulses.  No murmur heard. Pulmonary/Chest: Effort normal and breath sounds normal. No respiratory distress.  Abdominal: Soft. Bowel sounds are normal. He exhibits no distension.  Musculoskeletal: He exhibits no edema.  Lymphadenopathy:    He has no cervical adenopathy.  Neurological: He is alert and oriented to person, place, and time. No cranial nerve deficit.  Skin: Skin is warm and dry.  Growth/change in mole anterior to R ear  Psychiatric: He has a normal mood and affect. His behavior is normal.  Vitals reviewed.         Assessment & Plan:

## 2018-01-14 NOTE — Assessment & Plan Note (Signed)
Chronic problem.  Tolerating statin w/o difficulty.  Check labs.  Adjust meds prn  

## 2018-01-14 NOTE — Patient Instructions (Addendum)
Schedule your complete physical in 6 months We'll notify you of your lab results and make any changes if needed Continue to work on healthy diet and regular exercise- you're doing great! We'll call you with your dermatology appt Call and schedule with your dentist!! Call with any questions or concerns Happy Labor Day!!!

## 2018-01-20 ENCOUNTER — Ambulatory Visit (INDEPENDENT_AMBULATORY_CARE_PROVIDER_SITE_OTHER): Payer: PPO | Admitting: Orthopaedic Surgery

## 2018-02-15 ENCOUNTER — Other Ambulatory Visit: Payer: Self-pay

## 2018-02-15 DIAGNOSIS — L820 Inflamed seborrheic keratosis: Secondary | ICD-10-CM | POA: Diagnosis not present

## 2018-02-15 DIAGNOSIS — D229 Melanocytic nevi, unspecified: Secondary | ICD-10-CM | POA: Diagnosis not present

## 2018-02-15 DIAGNOSIS — D485 Neoplasm of uncertain behavior of skin: Secondary | ICD-10-CM | POA: Diagnosis not present

## 2018-02-15 DIAGNOSIS — L570 Actinic keratosis: Secondary | ICD-10-CM | POA: Diagnosis not present

## 2018-03-09 ENCOUNTER — Other Ambulatory Visit: Payer: Self-pay | Admitting: Family Medicine

## 2018-03-09 MED ORDER — CITALOPRAM HYDROBROMIDE 20 MG PO TABS: 20.0000 mg | ORAL_TABLET | Freq: Every day | ORAL | 0 refills | Status: DC

## 2018-03-09 MED ORDER — TELMISARTAN 80 MG PO TABS: 80.0000 mg | ORAL_TABLET | Freq: Every day | ORAL | 1 refills | Status: DC

## 2018-03-09 NOTE — Telephone Encounter (Signed)
Requested Prescriptions  Pending Prescriptions Disp Refills  . citalopram (CELEXA) 20 MG tablet 90 tablet 0    Sig: Take 1 tablet (20 mg total) by mouth daily.     Psychiatry:  Antidepressants - SSRI Passed - 03/09/2018  3:08 PM      Passed - Valid encounter within last 6 months    Recent Outpatient Visits          1 month ago Essential hypertension   [facility] [place] Midge Minium, MD   3 months ago Left flank pain   [place], Luanna Cole, [place]   3 months ago Heat effects, initial encounter   [facility] Midge Minium, MD   6 months ago Essential hypertension   [facility] [place] Midge Minium, MD   7 months ago Physical exam   [facility] Midge Minium, MD           . meclizine (ANTIVERT) 25 MG tablet 45 tablet 0    Sig: TAKE 1 TABLET BY MOUTH THREE TIMES DAILY AS NEEDED FOR DIZZINESS     Not Delegated - Gastroenterology: Antiemetics Failed - 03/09/2018  3:08 PM      Failed - This refill cannot be delegated      Passed - Valid encounter within last 6 months    Recent Outpatient Visits          1 month ago Essential hypertension   [facility] Midge Minium, MD   3 months ago Left flank pain   [place] [facility], Luanna Cole, PA-C   3 months ago Heat effects, initial encounter   [facility] Midge Minium, MD   6 months ago Essential hypertension   [facility] Midge Minium, MD   7 months ago Physical exam   [facility] Midge Minium, MD           . telmisartan (MICARDIS) 80 MG tablet 90 tablet 1    Sig: Take 1 tablet (80 mg total) by mouth daily.     Cardiovascular:   Angiotensin Receptor Blockers Passed - 03/09/2018  3:08 PM      Passed - Cr in normal range and within 180 days    Creatinine, Ser  Date Value Ref Range Status  01/14/2018 1.03 0.40 - 1.50 mg/dL Final         Passed - K in normal range and within 180 days    Potassium  Date Value Ref Range Status  01/14/2018 5.0 3.5 - 5.1 mEq/L Final         Passed - Patient is not pregnant      Passed - Last BP in normal range    BP Readings from Last 1 Encounters:  01/14/18 136/78         Passed - Valid encounter within last 6 months    Recent Outpatient Visits          1 month ago Essential hypertension   [facility] Midge Minium, MD   3 months ago Left flank pain   [place], [place]   3 months ago Heat effects, initial encounter   [facility] [place] Midge Minium, MD   6 months ago Essential hypertension   [facility] [place] Midge Minium, MD   7 months ago Physical exam   [facility] [place] Midge Minium, MD

## 2018-03-09 NOTE — Telephone Encounter (Signed)
Copied from [place] [id number]. Topic: Quick Communication - Rx Refill/Question >> Mar 09, 2018  2:58 PM Keene Breath wrote: Medication: meclizine (ANTIVERT) 25 MG tablet / citalopram (CELEXA) 20 MG tablet / telmisartan (MICARDIS) 80 MG tablet  Patient called to request a refill for the above medications.  CB# [phone]  Preferred Pharmacy (with phone number or street name): [facility], [place] [phone] (Phone) [phone] (Fax)

## 2018-03-09 NOTE — Telephone Encounter (Signed)
Requested medication (s) are due for refill today: yes  Requested medication (s) are on the active medication list: yes  Last refill:  12/29/17  Future visit scheduled: no  Notes to clinic:  undelegated    Requested Prescriptions  Pending Prescriptions Disp Refills   meclizine (ANTIVERT) 25 MG tablet 45 tablet 0    Sig: TAKE 1 TABLET BY MOUTH THREE TIMES DAILY AS NEEDED FOR DIZZINESS     Not Delegated - Gastroenterology: Antiemetics Failed - 03/09/2018  3:08 PM      Failed - This refill cannot be delegated      Passed - Valid encounter within last 6 months    Recent Outpatient Visits          1 month ago Essential hypertension   [facility] [place] Midge Minium, MD   3 months ago Left flank pain   [place] Martin, Luanna Cole, PA-C   3 months ago Heat effects, initial encounter   [facility] [place] Midge Minium, MD   6 months ago Essential hypertension   [facility] [place] Midge Minium, MD   7 months ago Physical exam   [facility] [place] Midge Minium, MD           Signed Prescriptions Disp Refills   citalopram (CELEXA) 20 MG tablet 90 tablet 0    Sig: Take 1 tablet (20 mg total) by mouth daily.     Psychiatry:  Antidepressants - SSRI Passed - 03/09/2018  3:08 PM      Passed - Valid encounter within last 6 months    Recent Outpatient Visits          1 month ago Essential hypertension   [facility] [place] Midge Minium, MD   3 months ago Left flank pain   [place] [facility], Luanna Cole, [place]   3 months ago Heat effects, initial encounter   [facility] [place] Midge Minium, MD   6 months ago Essential hypertension   [facility] [place] Midge Minium, MD    7 months ago Physical exam   [facility] [place] Midge Minium, MD            telmisartan (MICARDIS) 80 MG tablet 90 tablet 1    Sig: Take 1 tablet (80 mg total) by mouth daily.     Cardiovascular:  Angiotensin Receptor Blockers Passed - 03/09/2018  3:08 PM      Passed - Cr in normal range and within 180 days    Creatinine, Ser  Date Value Ref Range Status  01/14/2018 1.03 0.40 - 1.50 mg/dL Final         Passed - K in normal range and within 180 days    Potassium  Date Value Ref Range Status  01/14/2018 5.0 3.5 - 5.1 mEq/L Final         Passed - Patient is not pregnant      Passed - Last BP in normal range    BP Readings from Last 1 Encounters:  01/14/18 136/78         Passed - Valid encounter within last 6 months    Recent Outpatient Visits          1 month ago Essential hypertension   [facility] Midge Minium, MD   3 months ago Left  flank pain   [place] [facility], Luanna Cole, [place]   3 months ago Heat effects, initial encounter   [facility] [place] T[place], Aundra Millet, MD   6 months ago Essential hypertension   [facility] [place] Midge Minium, MD   7 months ago Physical exam   [facility] [place] Midge Minium, MD

## 2018-03-09 NOTE — Telephone Encounter (Signed)
Okay for Meclizine refill?

## 2018-03-10 MED ORDER — MECLIZINE HCL 25 MG PO TABS: ORAL_TABLET | ORAL | 0 refills | Status: DC

## 2018-03-17 ENCOUNTER — Other Ambulatory Visit: Payer: Self-pay | Admitting: Family Medicine

## 2018-04-29 ENCOUNTER — Ambulatory Visit: Payer: PPO | Admitting: Family Medicine

## 2018-04-29 DIAGNOSIS — S61201A Unspecified open wound of left index finger without damage to nail, initial encounter: Secondary | ICD-10-CM | POA: Diagnosis not present

## 2018-05-10 DIAGNOSIS — Z4802 Encounter for removal of sutures: Secondary | ICD-10-CM | POA: Diagnosis not present

## 2018-05-10 DIAGNOSIS — I10 Essential (primary) hypertension: Secondary | ICD-10-CM | POA: Diagnosis not present

## 2018-06-24 ENCOUNTER — Other Ambulatory Visit: Payer: Self-pay | Admitting: General Practice

## 2018-06-24 MED ORDER — SIMVASTATIN 10 MG PO TABS: 10.0000 mg | ORAL_TABLET | Freq: Every day | ORAL | 1 refills | Status: DC

## 2018-06-29 ENCOUNTER — Other Ambulatory Visit: Payer: Self-pay | Admitting: Family Medicine

## 2018-07-20 ENCOUNTER — Other Ambulatory Visit: Payer: Self-pay

## 2018-07-20 ENCOUNTER — Encounter: Payer: Self-pay | Admitting: Family Medicine

## 2018-07-20 ENCOUNTER — Ambulatory Visit (INDEPENDENT_AMBULATORY_CARE_PROVIDER_SITE_OTHER): Payer: PPO | Admitting: Family Medicine

## 2018-07-20 VITALS — BP 118/72 | HR 73 | Temp 98.3°F | Resp 16 | Ht 68.0 in | Wt 245.4 lb

## 2018-07-20 DIAGNOSIS — R8290 Unspecified abnormal findings in urine: Secondary | ICD-10-CM | POA: Diagnosis not present

## 2018-07-20 DIAGNOSIS — N200 Calculus of kidney: Secondary | ICD-10-CM

## 2018-07-20 DIAGNOSIS — N3001 Acute cystitis with hematuria: Secondary | ICD-10-CM

## 2018-07-20 LAB — POCT URINALYSIS DIPSTICK
Bilirubin, UA: NEGATIVE
Glucose, UA: NEGATIVE
Ketones, UA: NEGATIVE
Leukocytes, UA: NEGATIVE
Nitrite, UA: POSITIVE
Protein, UA: POSITIVE — AB
Spec Grav, UA: 1.02 (ref 1.010–1.025)
Urobilinogen, UA: 0.2 E.U./dL
pH, UA: 6 (ref 5.0–8.0)

## 2018-07-20 MED ORDER — MECLIZINE HCL 25 MG PO TABS: ORAL_TABLET | ORAL | 0 refills | Status: DC

## 2018-07-20 MED ORDER — ONDANSETRON HCL 4 MG PO TABS: 4.0000 mg | ORAL_TABLET | Freq: Three times a day (TID) | ORAL | 0 refills | Status: DC | PRN

## 2018-07-20 MED ORDER — CIPROFLOXACIN HCL 500 MG PO TABS: 500.0000 mg | ORAL_TABLET | Freq: Two times a day (BID) | ORAL | 0 refills | Status: DC

## 2018-07-20 NOTE — Addendum Note (Signed)
Addended by: Doran Clay A on: 07/20/2018 11:22 AM   Modules accepted: Orders

## 2018-07-20 NOTE — Patient Instructions (Signed)
Follow up as needed or as scheduled START the Cipro twice daily for urinary infection- take w/ food Drink LOTS of fluids Call with any questions or concerns Hang in there!!

## 2018-07-20 NOTE — Addendum Note (Signed)
Addended by: Davis Gourd on: 07/20/2018 11:21 AM   Modules accepted: Orders

## 2018-07-20 NOTE — Progress Notes (Signed)
   Subjective:    Patient ID: Taylor Harvey., male    DOB: 15-Mar-1951, 68 y.o.   MRN: [id number]  HPI Urine odor- pt reports sxs started ~4 weeks ago.  Yesterday developed pain in L kidney.  Pt feels he passed a stone in his urine.  'I feel fine now'.  Increased frequency.  + urgency.  No fever.   Review of Systems For ROS see HPI     Objective:   Physical Exam Vitals signs reviewed.  Constitutional:      Appearance: Normal appearance. He is obese. He is not ill-appearing.  HENT:     Head: Normocephalic and atraumatic.  Abdominal:     General: There is no distension.     Palpations: Abdomen is soft.     Tenderness: There is no abdominal tenderness. There is no right CVA tenderness, left CVA tenderness or guarding.  Skin:    General: Skin is warm and dry.     Coloration: Skin is not jaundiced or pale.  Neurological:     General: No focal deficit present.     Mental Status: He is alert and oriented to person, place, and time.  Psychiatric:        Mood and Affect: Mood normal.        Behavior: Behavior normal.        Thought Content: Thought content normal.           Assessment & Plan:  Kidney stone- new to provider.  Pt has hx of similar.  He reports this feels very similar to previous stones and that 'I feel better now' since passing the stone here in the office.  UA is still consistent w/ infxn- will start Cipro.  Reviewed supportive care and red flags that should prompt return.  Pt expressed understanding and is in agreement w/ plan.

## 2018-07-22 LAB — URINE CULTURE
MICRO NUMBER:: 239657
SPECIMEN QUALITY:: ADEQUATE

## 2018-07-26 ENCOUNTER — Other Ambulatory Visit: Payer: Self-pay | Admitting: Family Medicine

## 2018-08-13 ENCOUNTER — Other Ambulatory Visit: Payer: Self-pay | Admitting: General Practice

## 2018-08-13 MED ORDER — TELMISARTAN 80 MG PO TABS: 80.0000 mg | ORAL_TABLET | Freq: Every day | ORAL | 1 refills | Status: DC

## 2018-08-16 ENCOUNTER — Encounter: Payer: Self-pay | Admitting: Family Medicine

## 2018-08-17 MED ORDER — PROMETHAZINE-DM 6.25-15 MG/5ML PO SYRP: 5.0000 mL | ORAL_SOLUTION | Freq: Four times a day (QID) | ORAL | 0 refills | Status: DC | PRN

## 2018-08-20 ENCOUNTER — Other Ambulatory Visit: Payer: Self-pay | Admitting: Family Medicine

## 2018-09-03 ENCOUNTER — Other Ambulatory Visit: Payer: Self-pay | Admitting: Family Medicine

## 2018-09-22 ENCOUNTER — Encounter: Payer: Self-pay | Admitting: Physician Assistant

## 2018-09-22 ENCOUNTER — Other Ambulatory Visit: Payer: Self-pay

## 2018-09-22 ENCOUNTER — Ambulatory Visit (INDEPENDENT_AMBULATORY_CARE_PROVIDER_SITE_OTHER): Payer: PPO | Admitting: Physician Assistant

## 2018-09-22 ENCOUNTER — Ambulatory Visit: Payer: PPO | Admitting: Physician Assistant

## 2018-09-22 VITALS — BP 110/62 | HR 65 | Temp 98.3°F | Resp 13 | Wt 244.2 lb

## 2018-09-22 DIAGNOSIS — S91331A Puncture wound without foreign body, right foot, initial encounter: Secondary | ICD-10-CM | POA: Diagnosis not present

## 2018-09-22 DIAGNOSIS — T148XXA Other injury of unspecified body region, initial encounter: Secondary | ICD-10-CM

## 2018-09-22 DIAGNOSIS — Z23 Encounter for immunization: Secondary | ICD-10-CM

## 2018-09-22 NOTE — Progress Notes (Signed)
Administered TD vaccine IM left arm per Elyn Aquas, PA orders. Patient tolerated well.

## 2018-09-22 NOTE — Progress Notes (Signed)
Patient presents to clinic today c/o puncture wound to bottom of right just proximal to pinky toe. Notes this happened early afternoon. Was able to remove nail easily. Notes was a very tiny nail but he knows his Tetanus is overdue. Denies fever, chills, pain or any drainage from site. Notes he did clean foot well with help of his wife.   Past Medical History:  Diagnosis Date  . Allergy   . Anxiety   . Arthritis   . Chronic kidney disease    hx kidney stones  . Depression   . GERD (gastroesophageal reflux disease)   . Hyperlipemia   . Hypertension   . Sleep apnea 2005   moderate-has cpap-not used in 3 yr    Current Outpatient Medications on File Prior to Visit  Medication Sig Dispense Refill  . ADVAIR DISKUS 250-50 MCG/DOSE AEPB INHALE 1 PUFF INTO LUNGS TWICE DAILY 60 each 0  . aspirin 81 MG tablet Take 81 mg by mouth daily.      . citalopram (CELEXA) 20 MG tablet Take 1 tablet by mouth once daily 90 tablet 0  . esomeprazole (NEXIUM) 40 MG capsule Take 40 mg by mouth daily.    . meclizine (ANTIVERT) 25 MG tablet TAKE 1 TABLET BY MOUTH THREE TIMES DAILY AS NEEDED FOR DIZZINESS 45 tablet 0  . ondansetron (ZOFRAN) 4 MG tablet Take 1 tablet (4 mg total) by mouth every 8 (eight) hours as needed for nausea or vomiting. 20 tablet 0  . simvastatin (ZOCOR) 10 MG tablet Take 1 tablet (10 mg total) by mouth daily. 90 tablet 1  . telmisartan (MICARDIS) 80 MG tablet Take 1 tablet (80 mg total) by mouth daily. 90 tablet 1  . traZODone (DESYREL) 50 MG tablet TAKE 1/2 TO 1 (ONE-HALF TO ONE) TABLET BY MOUTH AT BEDTIME AS NEEDED FOR SLEEP 30 tablet 0  . ciprofloxacin (CIPRO) 500 MG tablet Take 1 tablet (500 mg total) by mouth 2 (two) times daily. (Patient not taking: Reported on 09/22/2018) 14 tablet 0  . promethazine-dextromethorphan (PROMETHAZINE-DM) 6.25-15 MG/5ML syrup Take 5 mLs by mouth 4 (four) times daily as needed. (Patient not taking: Reported on 09/22/2018) 240 mL 0   No current  facility-administered medications on file prior to visit.     No Known Allergies  Family History  Problem Relation Age of Onset  . Diabetes Mother   . Pancreatitis Mother   . Heart disease Mother 10       MI, died with MI  . Stroke Father   . Hypertension Father   . Peripheral vascular disease Father        Carotid surgery  . Heart disease Sister        Arrhythmia  . Diabetes Sister 62       DM  . Kidney cancer Sister   . Diabetes Sister   . Cancer Sister        colon  . Heart disease Sister   . Diabetes Sister   . Heart disease Sister   . Stroke Brother   . Colon cancer Sister   . Pancreatic cancer Other     Social History   Socioeconomic History  . Marital status: Married    Spouse name: Not on file  . Number of children: 6  . Years of education: Not on file  . Highest education level: Not on file  Occupational History  . Occupation: retired  Scientific laboratory technician  . Financial resource strain: Not on file  .  Food insecurity:    Worry: Not on file    Inability: Not on file  . Transportation needs:    Medical: Not on file    Non-medical: Not on file  Tobacco Use  . Smoking status: Never Smoker  . Smokeless tobacco: Never Used  Substance and Sexual Activity  . Alcohol use: No  . Drug use: No  . Sexual activity: Yes    Partners: Female  Lifestyle  . Physical activity:    Days per week: Not on file    Minutes per session: Not on file  . Stress: Not on file  Relationships  . Social connections:    Talks on phone: Not on file    Gets together: Not on file    Attends religious service: Not on file    Active member of club or organization: Not on file    Attends meetings of clubs or organizations: Not on file    Relationship status: Not on file  Other Topics Concern  . Not on file  Social History Narrative   Exercise--walks a mile 5 days a week.  One deceased child age 66.  One stepchild, 5 adopted.    Review of Systems - See HPI.  All other ROS are  negative.  BP 110/62   Pulse 65   Temp 98.3 F (36.8 C) (Oral)   Resp 13   Wt 244 lb 3.2 oz (110.8 kg)   SpO2 96%   BMI 37.13 kg/m   Physical Exam Vitals signs reviewed.  Constitutional:      Appearance: Normal appearance.  HENT:     Head: Normocephalic and atraumatic.  Cardiovascular:     Rate and Rhythm: Normal rate and regular rhythm.     Pulses: Normal pulses.  Pulmonary:     Effort: Pulmonary effort is normal.  Musculoskeletal:       Feet:  Neurological:     Mental Status: He is alert.     Recent Results (from the past 2160 hour(s))  POCT urinalysis dipstick     Status: Abnormal   Collection Time: 07/20/18 10:44 AM  Result Value Ref Range   Color, UA yeollow    Clarity, UA clear    Glucose, UA Negative Negative   Bilirubin, UA negative    Ketones, UA negative    Spec Grav, UA 1.020 1.010 - 1.025   Blood, UA 2+    pH, UA 6.0 5.0 - 8.0   Protein, UA Positive (A) Negative   Urobilinogen, UA 0.2 0.2 or 1.0 E.U./dL   Nitrite, UA positive    Leukocytes, UA Negative Negative   Appearance     Odor    Urine Culture     Status: Abnormal   Collection Time: 07/20/18 11:22 AM  Result Value Ref Range   MICRO NUMBER: [id number]    SPECIMEN QUALITY: Adequate    Sample Source URINE    STATUS: FINAL    ISOLATE 1: ESBL Escherichia coli (A)     Comment: Greater than 100,000 CFU/mL of Escherichia coli (ESBL)      Susceptibility   Esbl escherichia coli - URINE CULTURE, REFLEX    AMOX/CLAVULANIC >=32 Resistant     AMPICILLIN* >=32 Resistant      * Extended spectrum beta-lactamase (ESBL) producingorganisms demonstrate decreased activity withpenicillins, cephalosporins and aztreonam.    AMPICILLIN/SULBACTAM >=32 Resistant     CEFAZOLIN* >=64 Resistant      * Extended spectrum beta-lactamase (ESBL) producingorganisms demonstrate decreased activity withpenicillins, cephalosporins and aztreonam.For uncomplicated  UTI caused by E. coli,K. pneumoniae or P. mirabilis: Cefazolin  issusceptible if MIC <32 mcg/mL and predictssusceptible to the oral agents cefaclor, cefdinir,cefpodoxime, cefprozil, cefuroxime, cephalexinand loracarbef.    CEFEPIME <=1 Resistant     CEFTRIAXONE >=64 Resistant     CIPROFLOXACIN <=0.25 Sensitive     LEVOFLOXACIN <=0.12 Sensitive     ERTAPENEM <=0.5 Sensitive     GENTAMICIN <=1 Sensitive     IMIPENEM <=0.25 Sensitive     NITROFURANTOIN <=16 Sensitive     PIP/TAZO 32 Intermediate     TOBRAMYCIN <=1 Sensitive     TRIMETH/SULFA* <=20 Sensitive      * Extended spectrum beta-lactamase (ESBL) producingorganisms demonstrate decreased activity withpenicillins, cephalosporins and aztreonam.For uncomplicated UTI caused by E. coli,K. pneumoniae or P. mirabilis: Cefazolin issusceptible if MIC <32 mcg/mL and predictssusceptible to the oral agents cefaclor, cefdinir,cefpodoxime, cefprozil, cefuroxime, cephalexinand loracarbef.Legend:S = Susceptible  I = IntermediateR = Resistant  NS = Not susceptible* = Not tested  NR = Not reported**NN = See antimicrobic comments    Assessment/Plan: 1. Puncture wound With rusty nail. Small. < 4mm puncture. No tenderness, redness or induration. Area cleaned, dried, topical antibiotic ointment applied and wound dressed. Td updated - given by CMA. Home care reviewed with patient. Signs/symptoms of infection reviewed with patient. Follow-up if any of these occur. Handout given.    Leeanne Rio, PA-C

## 2018-09-22 NOTE — Patient Instructions (Signed)
Please keep skin clean and dry. Wash with warm, soapy water. Pat completely dry. Apply thin layer of Neosporin over the area for a couple of days. Make sure to wear clean socks and footwear. Take these off as soon as you are home so feet can dry out. Wash if needed.  Your Tetanus shot was updated today.  Call immediately for any new onset redness, heat, pain or drainage from the area as these are signs of infection and would warrant antibiotic therapy.

## 2018-10-15 ENCOUNTER — Other Ambulatory Visit: Payer: Self-pay | Admitting: Family Medicine

## 2018-10-31 ENCOUNTER — Other Ambulatory Visit: Payer: Self-pay | Admitting: Family Medicine

## 2018-12-06 ENCOUNTER — Other Ambulatory Visit: Payer: Self-pay | Admitting: Family Medicine

## 2018-12-06 IMAGING — MR MR SHOULDER*L* W/O CM
5 series · 32 of 40 positions shown · non-contrast
Comparison: None.

CLINICAL DATA: Left shoulder pain for 6 months.  No known injury.

EXAM:
MRI OF THE LEFT SHOULDER WITHOUT CONTRAST
TECHNIQUE: Multiplanar, multisequence MR imaging of the shoulder was performed.
No intravenous contrast was administered.

[Series 3: T2 fat-sat · axial · 4.0mm · 0.55mm/px · z∈[-81,+7]mm · 8 of 21 slices shown (1 of 3)]
[im 1/21]
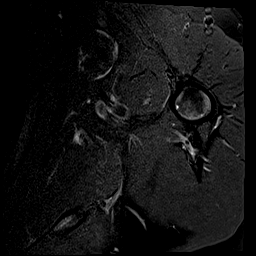
[im 3/21]
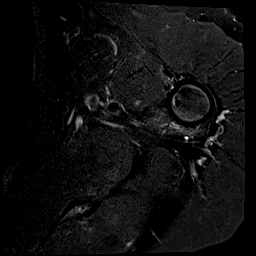
[im 6/21]
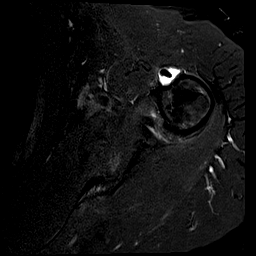
[im 9/21]
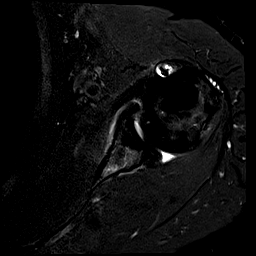
[im 12/21]
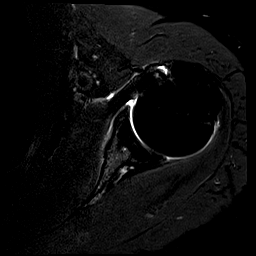
[im 15/21]
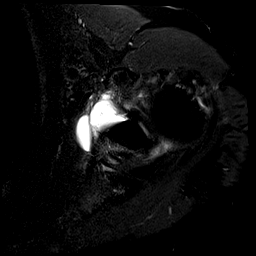
[im 18/21]
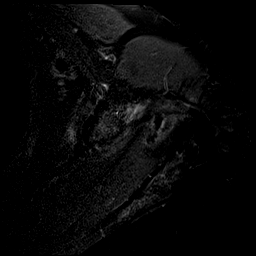
[im 21/21]
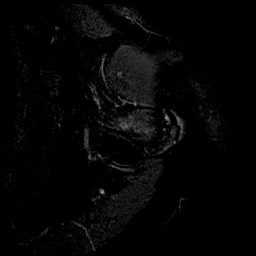

[Series 4: T2 fat-sat · oblique · 4.0mm · 0.59mm/px · 7 of 18 slices shown (2 of 3)]
[im 1/18]
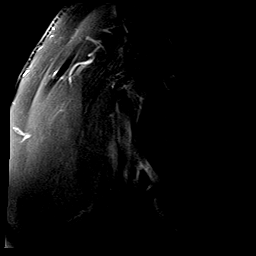
[im 3/18]
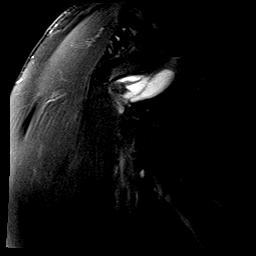
[im 6/18]
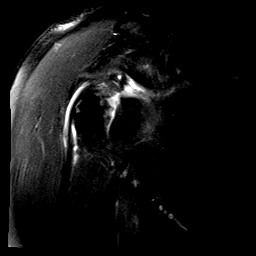
[im 9/18]
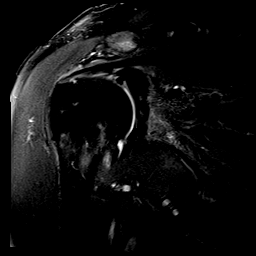
[im 12/18]
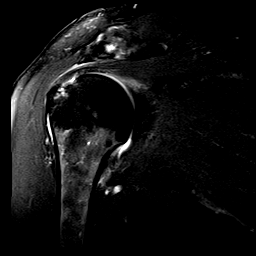
[im 15/18]
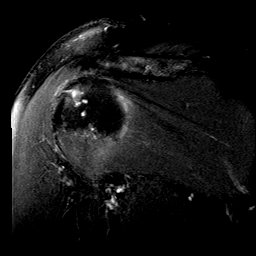
[im 18/18]
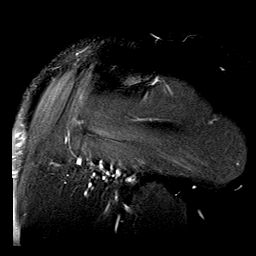

[Series 5: PD · oblique · 4.0mm · 0.23mm/px · 7 of 18 slices shown]
[im 1/18]
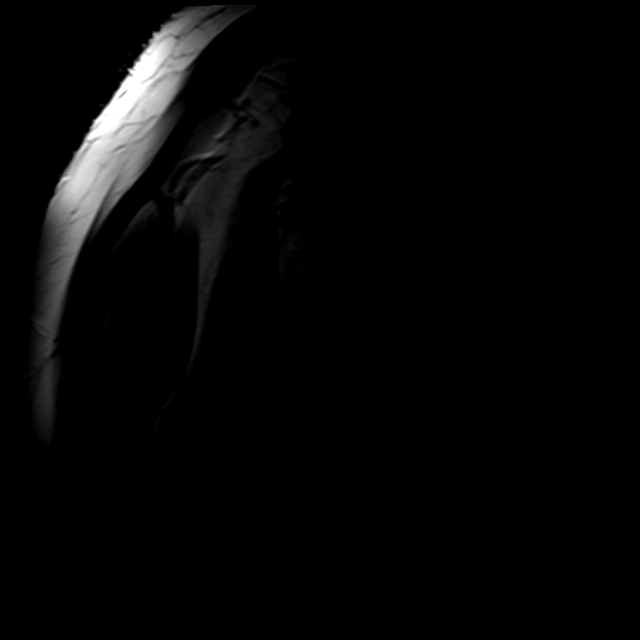
[im 3/18]
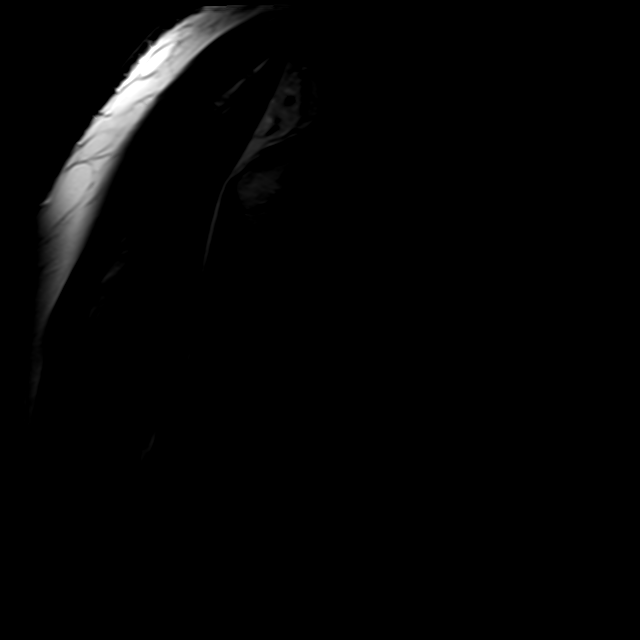
[im 6/18]
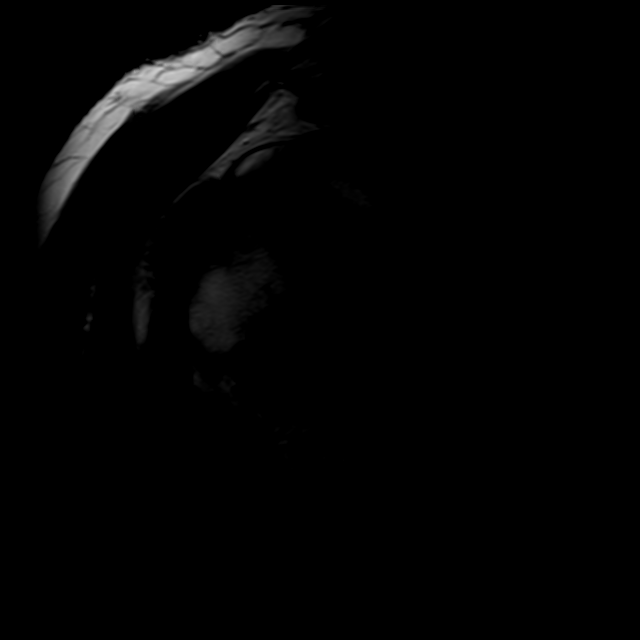
[im 9/18]
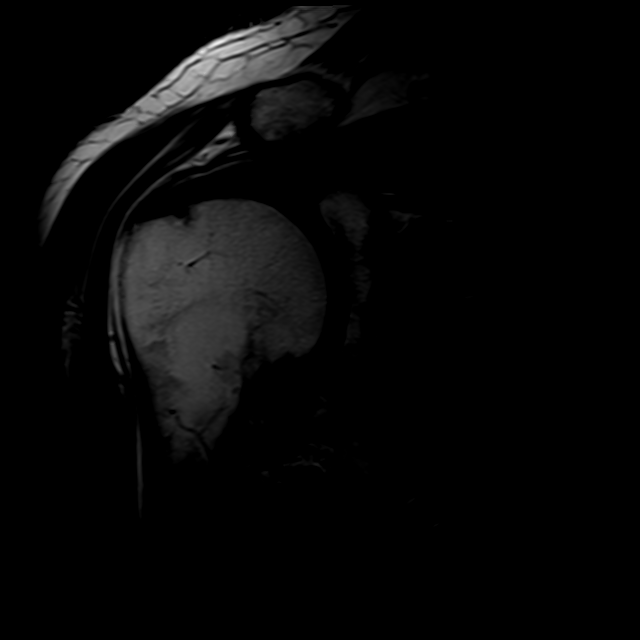
[im 12/18]
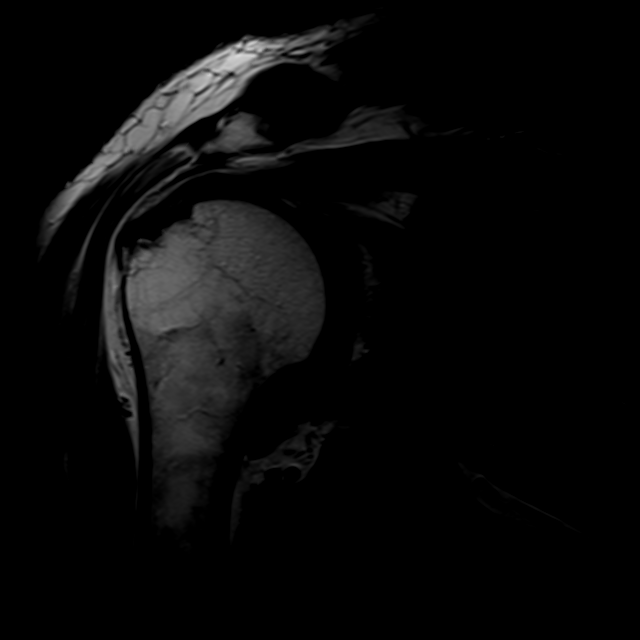
[im 15/18]
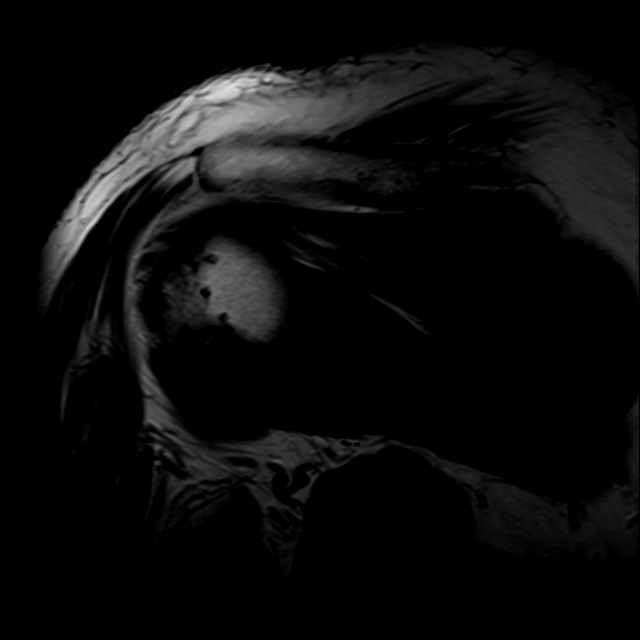
[im 18/18]
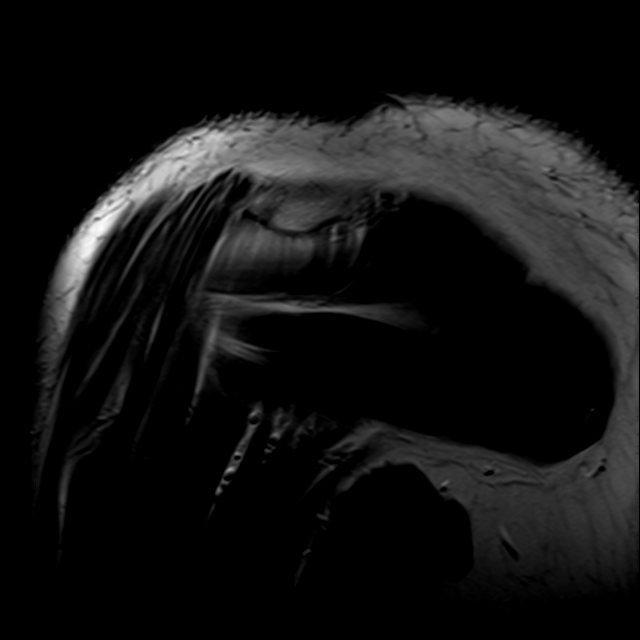

[Series 6: T1 · oblique · 4.0mm · 0.23mm/px · 1 of 23 slices shown]
[im 1/23]
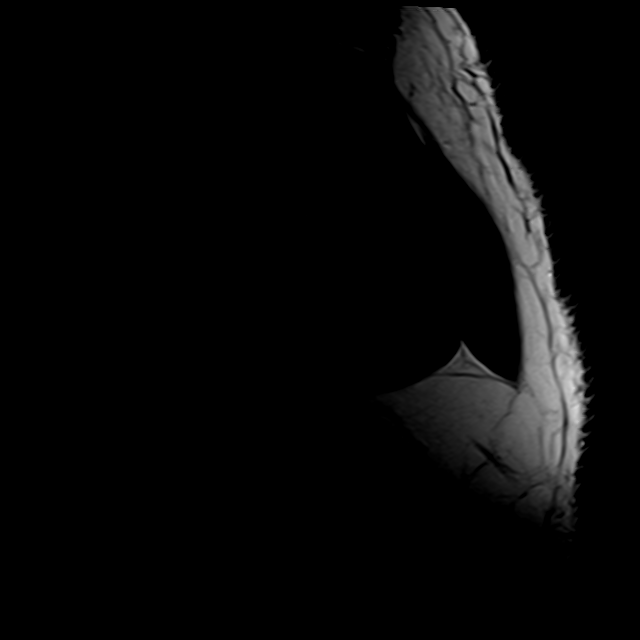

[Series 7: T2 fat-sat · oblique · 4.0mm · 0.59mm/px · 9 of 23 slices shown (3 of 3)]
[im 1/23]
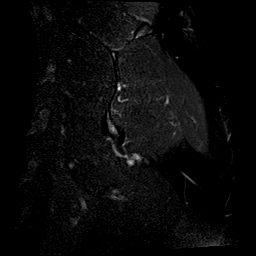
[im 3/23]
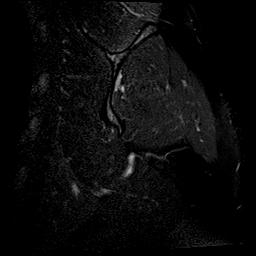
[im 6/23]
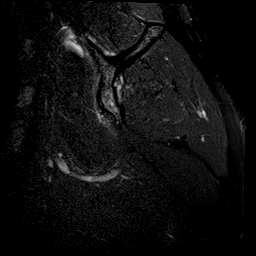
[im 9/23]
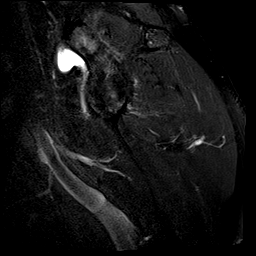
[im 12/23]
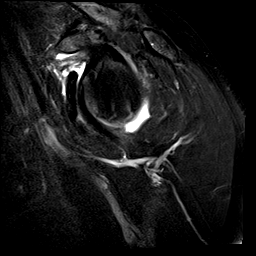
[im 14/23]
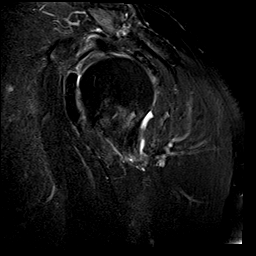
[im 17/23]
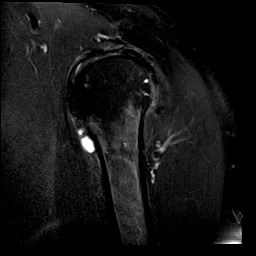
[im 20/23]
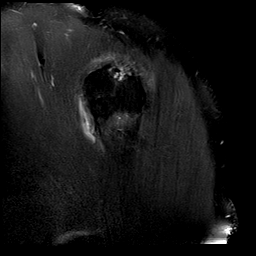
[im 23/23]
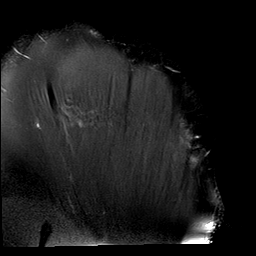

[32 of 40 positions shown; findings below may reference images not displayed]

FINDINGS: Rotator cuff: There is some heterogeneously increased T2 signal in
the supraspinatus infraspinatus tendons consistent with
tendinopathy. No tear.

Muscles:  No atrophy or focal lesion.

Biceps long head: There is tendinopathy of the intra-articular
segment with a likely longitudinal split tear.

Acromioclavicular Joint: Bulky degenerative change is present with
marrow edema about the joint. Type 1 acromion. Small volume of
subacromial/subdeltoid fluid noted.

Glenohumeral Joint: Cartilage is diffusely thinned. No subchondral
edema or cyst formation. No osteophytosis. The inferior glenohumeral
ligament is thickened with intermediate increased T2 signal.

Labrum:  Intact.

Bones:  No fracture or worrisome lesion.

Other: None.
IMPRESSION: Supraspinatus and infraspinatus tendinopathy without tear.

Tendinopathy of the intra-articular long head of biceps. There is
likely longitudinal split tearing of the tendon at and just proximal
to its attachment to the superior labrum.

Bulky acromioclavicular osteoarthritis without marrow edema about
the joint.

Mild to moderate glenohumeral osteoarthritis.

Findings compatible with adhesive capsulitis.

Small volume of subacromial/subdeltoid fluid indicative of bursitis.

## 2019-01-31 ENCOUNTER — Other Ambulatory Visit: Payer: Self-pay | Admitting: Family Medicine

## 2019-02-18 ENCOUNTER — Other Ambulatory Visit: Payer: Self-pay | Admitting: Family Medicine

## 2019-03-11 ENCOUNTER — Other Ambulatory Visit: Payer: Self-pay | Admitting: Family Medicine

## 2019-03-25 ENCOUNTER — Other Ambulatory Visit: Payer: Self-pay | Admitting: Family Medicine

## 2019-04-04 ENCOUNTER — Other Ambulatory Visit: Payer: Self-pay | Admitting: Family Medicine

## 2019-04-04 NOTE — Telephone Encounter (Signed)
Taylor Harvey for refills? Pt has been seen for acutes but not a BP follow up.

## 2019-04-05 ENCOUNTER — Encounter: Payer: Self-pay | Admitting: General Practice

## 2019-04-14 ENCOUNTER — Encounter: Payer: Self-pay | Admitting: Family Medicine

## 2019-04-14 ENCOUNTER — Ambulatory Visit (INDEPENDENT_AMBULATORY_CARE_PROVIDER_SITE_OTHER): Payer: PPO | Admitting: Family Medicine

## 2019-04-14 ENCOUNTER — Other Ambulatory Visit: Payer: Self-pay

## 2019-04-14 VITALS — BP 136/74 | HR 97 | Temp 97.9°F | Resp 16 | Ht 68.0 in | Wt 249.1 lb

## 2019-04-14 DIAGNOSIS — G4730 Sleep apnea, unspecified: Secondary | ICD-10-CM | POA: Diagnosis not present

## 2019-04-14 DIAGNOSIS — Z125 Encounter for screening for malignant neoplasm of prostate: Secondary | ICD-10-CM

## 2019-04-14 DIAGNOSIS — E785 Hyperlipidemia, unspecified: Secondary | ICD-10-CM | POA: Diagnosis not present

## 2019-04-14 DIAGNOSIS — Z0000 Encounter for general adult medical examination without abnormal findings: Secondary | ICD-10-CM

## 2019-04-14 DIAGNOSIS — K219 Gastro-esophageal reflux disease without esophagitis: Secondary | ICD-10-CM | POA: Diagnosis not present

## 2019-04-14 DIAGNOSIS — I10 Essential (primary) hypertension: Secondary | ICD-10-CM | POA: Diagnosis not present

## 2019-04-14 LAB — CBC WITH DIFFERENTIAL/PLATELET
Basophils Absolute: 0 10*3/uL (ref 0.0–0.1)
Basophils Relative: 0.8 % (ref 0.0–3.0)
Eosinophils Absolute: 0.2 10*3/uL (ref 0.0–0.7)
Eosinophils Relative: 3.1 % (ref 0.0–5.0)
HCT: 43.2 % (ref 39.0–52.0)
Hemoglobin: 14.2 g/dL (ref 13.0–17.0)
Lymphocytes Relative: 25.4 % (ref 12.0–46.0)
Lymphs Abs: 1.2 10*3/uL (ref 0.7–4.0)
MCHC: 33 g/dL (ref 30.0–36.0)
MCV: 93.3 fl (ref 78.0–100.0)
Monocytes Absolute: 0.5 10*3/uL (ref 0.1–1.0)
Monocytes Relative: 10 % (ref 3.0–12.0)
Neutro Abs: 2.9 10*3/uL (ref 1.4–7.7)
Neutrophils Relative %: 60.7 % (ref 43.0–77.0)
Platelets: 190 10*3/uL (ref 150.0–400.0)
RBC: 4.63 Mil/uL (ref 4.22–5.81)
RDW: 13.4 % (ref 11.5–15.5)
WBC: 4.9 10*3/uL (ref 4.0–10.5)

## 2019-04-14 LAB — HEPATIC FUNCTION PANEL
ALT: 19 U/L (ref 0–53)
AST: 15 U/L (ref 0–37)
Albumin: 4.2 g/dL (ref 3.5–5.2)
Alkaline Phosphatase: 95 U/L (ref 39–117)
Bilirubin, Direct: 0.1 mg/dL (ref 0.0–0.3)
Total Bilirubin: 0.6 mg/dL (ref 0.2–1.2)
Total Protein: 6.4 g/dL (ref 6.0–8.3)

## 2019-04-14 LAB — LIPID PANEL
Cholesterol: 108 mg/dL (ref 0–200)
HDL: 31.8 mg/dL — ABNORMAL LOW (ref >39.00)
LDL Cholesterol: 54 mg/dL (ref 0–99)
NonHDL: 75.91
Total CHOL/HDL Ratio: 3
Triglycerides: 111 mg/dL (ref 0.0–149.0)
VLDL: 22.2 mg/dL (ref 0.0–40.0)

## 2019-04-14 LAB — BASIC METABOLIC PANEL
BUN: 14 mg/dL (ref 6–23)
CO2: 29 mEq/L (ref 19–32)
Calcium: 9.2 mg/dL (ref 8.4–10.5)
Chloride: 106 mEq/L (ref 96–112)
Creatinine, Ser: 1.08 mg/dL (ref 0.40–1.50)
GFR: 67.62 mL/min (ref >60.00)
Glucose, Bld: 116 mg/dL — ABNORMAL HIGH (ref 70–99)
Potassium: 5.3 mEq/L — ABNORMAL HIGH (ref 3.5–5.1)
Sodium: 142 mEq/L (ref 135–145)

## 2019-04-14 LAB — TSH: TSH: 1.06 u[IU]/mL (ref 0.35–4.50)

## 2019-04-14 LAB — PSA, MEDICARE: PSA: 0.26 ng/ml (ref 0.10–4.00)

## 2019-04-14 LAB — HEMOGLOBIN A1C: Hgb A1c MFr Bld: 6.3 % (ref 4.6–6.5)

## 2019-04-14 NOTE — Progress Notes (Signed)
   Subjective:    Patient ID: Taylor Hua., male    DOB: 07-19-1950, 68 y.o.   MRN: [id number]  HPI Here today for CPE and MWV.  Risk Factors: HTN- chronic problem, Telmisartan 80mg  daily w/ adequate control Hyperlipidemia- chronic problem, on Simvastatin 10mg  daily Obesity- ongoing issue, pt has gained 5 lbs since walking partner passed away.  BMI now 37.88.  'i'm trying to lose some weight but I'm taking a break for Thanksgiving' OSA- chronic problem, is supposed to wear CPAP at night but is having trouble w/ machine Physical Activity: no longer walking b/c exercise partner passed away Fall Risk: medium, has had multiple falls but no injuries Depression: ongoing issue, on Celexa w/ adequate control Hearing: normal to conversational tones, mildly decreased to whispered voice ADL's: independent Cognitive: normal linear thought process, memory and attention intact Home Safety: safe at home, lives w/ wife Height, Weight, BMI, Visual Acuity: see vitals, vision corrected to 20/20 w/ glasses Counseling: UTD on immunizations, colonoscopy Labs Ordered: See A&P Care Plan: See A&P   Patient Care Team    Relationship Specialty Notifications Start End  Midge Minium, MD PCP - General Family Medicine  03/17/14    Comment: Gwendlyn Deutscher, MD Consulting Physician Gastroenterology  07/21/16   Mcarthur Rossetti, [place] Physician Orthopedic Surgery  07/23/17      Review of Systems Patient reports no vision/hearing changes, anorexia, fever ,adenopathy, persistant/recurrent hoarseness, swallowing issues, chest pain, palpitations, edema, persistant/recurrent cough, hemoptysis, dyspnea (rest,exertional, paroxysmal nocturnal), gastrointestinal  bleeding (melena, rectal bleeding), abdominal pain, GU symptoms (dysuria, hematuria, voiding/incontinence issues) syncope, focal weakness, memory loss, numbness & tingling, skin/hair/nail changes, depression, anxiety, abnormal  bruising/bleeding, musculoskeletal symptoms/signs.   + GERD- currently on OTC Omeprazole but having fairly regular breakthrough sxs    Objective:   Physical Exam General Appearance:    Alert, cooperative, no distress, appears stated age  Head:    Normocephalic, without obvious abnormality, atraumatic  Eyes:    PERRL, conjunctiva/corneas clear, EOM's intact, fundi    benign, both eyes       Ears:    Normal TM's and external ear canals, both ears  Nose:   Deferred due to COVID  Throat:   Neck:   Supple, symmetrical, trachea midline, no adenopathy;       thyroid:  No enlargement/tenderness/nodules  Back:     Symmetric, no curvature, ROM normal, no CVA tenderness  Lungs:     Clear to auscultation bilaterally, respirations unlabored  Chest wall:    No tenderness or deformity  Heart:    Regular rate and rhythm, S1 and S2 normal, no murmur, rub   or gallop  Abdomen:     Soft, non-tender, bowel sounds active all four quadrants,    no masses, no organomegaly  Genitalia:    Deferred at pt's request  Rectal:    Extremities:   Extremities normal, atraumatic, no cyanosis or edema  Pulses:   2+ and symmetric all extremities  Skin:   Skin color, texture, turgor normal, no rashes or lesions  Lymph nodes:   Cervical, supraclavicular, and axillary nodes normal  Neurologic:   CNII-XII intact. Normal strength, sensation and reflexes      throughout          Assessment & Plan:

## 2019-04-14 NOTE — Assessment & Plan Note (Signed)
Deteriorated.  Pt has gained 5 lbs since he stopped walking.  Stressed need for him to resume regular exercise and continue healthy diet.  Will follow.

## 2019-04-14 NOTE — Patient Instructions (Signed)
Follow up in 6 months to recheck BP and cholesterol and weight loss progress We'll notify you of your lab results and make any changes if needed Continue to work on healthy diet and regular exercise- you can do it! Call with any questions or concerns Stay Safe!  Stay Healthy! Happy Holidays!   Preventive Care 50 Years and Older, Male Preventive care refers to lifestyle choices and visits with your health care provider that can promote health and wellness. This includes:  A yearly physical exam. This is also called an annual well check.  Regular dental and eye exams.  Immunizations.  Screening for certain conditions.  Healthy lifestyle choices, such as diet and exercise. What can I expect for my preventive care visit? Physical exam Your health care provider will check:  Height and weight. These may be used to calculate body mass index (BMI), which is a measurement that tells if you are at a healthy weight.  Heart rate and blood pressure.  Your skin for abnormal spots. Counseling Your health care provider may ask you questions about:  Alcohol, tobacco, and drug use.  Emotional well-being.  Home and relationship well-being.  Sexual activity.  Eating habits.  History of falls.  Memory and ability to understand (cognition).  Work and work Statistician. What immunizations do I need?  Influenza (flu) vaccine  This is recommended every year. Tetanus, diphtheria, and pertussis (Tdap) vaccine  You may need a Td booster every 10 years. Varicella (chickenpox) vaccine  You may need this vaccine if you have not already been vaccinated. Zoster (shingles) vaccine  You may need this after age 81. Pneumococcal conjugate (PCV13) vaccine  One dose is recommended after age 16. Pneumococcal polysaccharide (PPSV23) vaccine  One dose is recommended after age 45. Measles, mumps, and rubella (MMR) vaccine  You may need at least one dose of MMR if you were born in 1957 or  later. You may also need a second dose. Meningococcal conjugate (MenACWY) vaccine  You may need this if you have certain conditions. Hepatitis A vaccine  You may need this if you have certain conditions or if you travel or work in places where you may be exposed to hepatitis A. Hepatitis B vaccine  You may need this if you have certain conditions or if you travel or work in places where you may be exposed to hepatitis B. Haemophilus influenzae type b (Hib) vaccine  You may need this if you have certain conditions. You may receive vaccines as individual doses or as more than one vaccine together in one shot (combination vaccines). Talk with your health care provider about the risks and benefits of combination vaccines. What tests do I need? Blood tests  Lipid and cholesterol levels. These may be checked every 5 years, or more frequently depending on your overall health.  Hepatitis C test.  Hepatitis B test. Screening  Lung cancer screening. You may have this screening every year starting at age 68 if you have a 30-pack-year history of smoking and currently smoke or have quit within the past 15 years.  Colorectal cancer screening. All adults should have this screening starting at age 44 and continuing until age 39. Your health care provider may recommend screening at age 54 if you are at increased risk. You will have tests every 1-10 years, depending on your results and the type of screening test.  Prostate cancer screening. Recommendations will vary depending on your family history and other risks.  Diabetes screening. This is done by checking  your blood sugar (glucose) after you have not eaten for a while (fasting). You may have this done every 1-3 years.  Abdominal aortic aneurysm (AAA) screening. You may need this if you are a current or former smoker.  Sexually transmitted disease (STD) testing. Follow these instructions at home: Eating and drinking  Eat a diet that includes  fresh fruits and vegetables, whole grains, lean protein, and low-fat dairy products. Limit your intake of foods with high amounts of sugar, saturated fats, and salt.  Take vitamin and mineral supplements as recommended by your health care provider.  Do not drink alcohol if your health care provider tells you not to drink.  If you drink alcohol: ? Limit how much you have to 0-2 drinks a day. ? Be aware of how much alcohol is in your drink. In the [place] one drink equals one 12 oz bottle of beer (355 mL), one 5 oz glass of wine (148 mL), or one 1 oz glass of hard liquor (44 mL). Lifestyle  Take daily care of your teeth and gums.  Stay active. Exercise for at least 30 minutes on 5 or more days each week.  Do not use any products that contain nicotine or tobacco, such as cigarettes, e-cigarettes, and chewing tobacco. If you need help quitting, ask your health care provider.  If you are sexually active, practice safe sex. Use a condom or other form of protection to prevent STIs (sexually transmitted infections).  Talk with your health care provider about taking a low-dose aspirin or statin. What's next?  Visit your health care provider once a year for a well check visit.  Ask your health care provider how often you should have your eyes and teeth checked.  Stay up to date on all vaccines. This information is not intended to replace advice given to you by your health care provider. Make sure you discuss any questions you have with your health care provider. Document Released: 06/08/2015 Document Revised: 05/06/2018 Document Reviewed: 05/06/2018 Elsevier Patient Education  2020 [employer].

## 2019-04-14 NOTE — Assessment & Plan Note (Signed)
Chronic problem.  Tolerating statin w/o difficulty.  Encouraged healthy diet and regular exercise.  Check labs.  Adjust meds prn. 

## 2019-04-14 NOTE — Assessment & Plan Note (Signed)
Deteriorated.  OTC medication is not strong enough and he is having breakthrough sxs.  Encouraged him to double OTC PPI to 2 tabs daily.  Will follow.

## 2019-04-14 NOTE — Assessment & Plan Note (Signed)
Pt is supposed to be on CPAP but reports he is having trouble w/ machine.  Will refer back to pulmonary for evaluation

## 2019-04-14 NOTE — Assessment & Plan Note (Signed)
Pt's PE unchanged from previous and WNL w/ exception of obesity.  UTD on colonoscopy, immunizations.  Check labs.  Anticipatory guidance provided.  

## 2019-04-14 NOTE — Assessment & Plan Note (Signed)
Chronic problem.  Adequate control.  Asymptomatic.  Check labs.  No anticipated med changes.  Will follow. 

## 2019-04-15 ENCOUNTER — Encounter: Payer: Self-pay | Admitting: General Practice

## 2019-05-02 ENCOUNTER — Ambulatory Visit: Payer: PPO | Admitting: Pulmonary Disease

## 2019-05-02 ENCOUNTER — Encounter: Payer: Self-pay | Admitting: Pulmonary Disease

## 2019-05-02 ENCOUNTER — Other Ambulatory Visit: Payer: Self-pay

## 2019-05-02 VITALS — BP 150/70 | HR 69 | Temp 97.1°F | Ht 67.0 in | Wt 252.2 lb

## 2019-05-02 DIAGNOSIS — G4733 Obstructive sleep apnea (adult) (pediatric): Secondary | ICD-10-CM | POA: Diagnosis not present

## 2019-05-02 NOTE — Progress Notes (Signed)
   Subjective:    Patient ID: [name], male    DOB: 11-Nov-1950, 68 y.o.   MRN: [id number]  HPI    Review of Systems  Constitutional: Negative for fever and unexpected weight change.  HENT: Negative for congestion, dental problem, ear pain, nosebleeds, postnasal drip, rhinorrhea, sinus pressure, sneezing, sore throat and trouble swallowing.   Eyes: Negative for redness and itching.  Respiratory: Positive for cough and shortness of breath. Negative for chest tightness and wheezing.   Cardiovascular: Negative for palpitations and leg swelling.  Gastrointestinal: Negative for nausea and vomiting.  Genitourinary: Negative for dysuria.  Musculoskeletal: Negative for joint swelling.  Skin: Negative for rash.  Neurological: Negative for headaches.  Hematological: Does not bruise/bleed easily.  Psychiatric/Behavioral: Negative for dysphoric mood. The patient is not nervous/anxious.        Objective:   Physical Exam        Assessment & Plan:

## 2019-05-02 NOTE — Progress Notes (Signed)
Taylor Harvey    [id number]    Jul 03, 1950  Primary Care Physician:Tabori, Aundra Millet, MD  Referring Physician: Midge Minium, MD [address] [place],  [place] [address]  Chief complaint:   Patient with a history of obstructive sleep apnea diagnosed 2014  HPI:  Machine is dated Has significant symptoms including witnessed apneas Loud snoring  Has woken himself up gasping Admits to dryness of his mouth in the mornings Morning headaches Memory is good with occasional forgetfulness  No family history of obstructive sleep apnea  Used to exercise regularly has gained about 10 to 20 pounds in the last year  Usually goes to bed between 1030 and 1130 Takes him about 30 minutes to 45 minutes to fall asleep 1-2 awakenings  Final wake up time about 6 AM  No pertinent occupational history No significant exposures   Outpatient Encounter Medications as of 05/02/2019  Medication Sig  . ADVAIR DISKUS 250-50 MCG/DOSE AEPB INHALE 1 DOSE BY MOUTH TWICE DAILY  . aspirin 81 MG tablet Take 81 mg by mouth daily.    . citalopram (CELEXA) 20 MG tablet Take 1 tablet by mouth once daily  . esomeprazole (NEXIUM) 40 MG capsule Take 40 mg by mouth daily.  . meclizine (ANTIVERT) 25 MG tablet TAKE 1 TABLET BY MOUTH THREE TIMES DAILY AS NEEDED FOR DIZZINESS  . ondansetron (ZOFRAN) 4 MG tablet Take 1 tablet (4 mg total) by mouth every 8 (eight) hours as needed for nausea or vomiting.  . simvastatin (ZOCOR) 10 MG tablet Take 1 tablet by mouth once daily  . telmisartan (MICARDIS) 80 MG tablet Take 1 tablet by mouth once daily  . traZODone (DESYREL) 50 MG tablet TAKE 1/2 TO 1 (ONE-HALF TO ONE) TABLET BY MOUTH AT BEDTIME AS NEEDED FOR SLEEP   No facility-administered encounter medications on file as of 05/02/2019.     Allergies as of 05/02/2019  . (No Known Allergies)    Past Medical History:  Diagnosis Date  . Allergy   . Anxiety   . Arthritis   . Chronic kidney  disease    hx kidney stones  . Depression   . GERD (gastroesophageal reflux disease)   . Hyperlipemia   . Hypertension   . Sleep apnea 2005   moderate-has cpap-not used in 3 yr    Past Surgical History:  Procedure Laterality Date  . COLONOSCOPY    . INSERTION OF MESH  05/05/2012   Procedure: INSERTION OF MESH;  Surgeon: Rolm Bookbinder, MD;  Location: [facility];  Service: General;  Laterality: N/A;  . LITHOTRIPSY     x2  . SHOULDER ARTHROSCOPY  07/2017  . trigger finger surgery  2006  . UMBILICAL HERNIA REPAIR  05/05/2012   Procedure: HERNIA REPAIR UMBILICAL ADULT;  Surgeon: Rolm Bookbinder, MD;  Location: [facility];  Service: General;  Laterality: N/A;    Family History  Problem Relation Age of Onset  . Diabetes Mother   . Pancreatitis Mother   . Heart disease Mother 3       MI, died with MI  . Stroke Father   . Hypertension Father   . Peripheral vascular disease Father        Carotid surgery  . Heart disease Sister        Arrhythmia  . Diabetes Sister 96       DM  . Kidney cancer Sister   . Diabetes  Sister   . Cancer Sister        colon  . Heart disease Sister   . Diabetes Sister   . Heart disease Sister   . Stroke Brother   . Colon cancer Sister   . Pancreatic cancer Other     Social History   Socioeconomic History  . Marital status: Married    Spouse name: Not on file  . Number of children: 6  . Years of education: Not on file  . Highest education level: Not on file  Occupational History  . Occupation: retired  Scientific laboratory technician  . Financial resource strain: Not on file  . Food insecurity    Worry: Not on file    Inability: Not on file  . Transportation needs    Medical: Not on file    Non-medical: Not on file  Tobacco Use  . Smoking status: Never Smoker  . Smokeless tobacco: Never Used  Substance and Sexual Activity  . Alcohol use: No  . Drug use: No  . Sexual activity: Yes    Partners: Female  Lifestyle   . Physical activity    Days per week: Not on file    Minutes per session: Not on file  . Stress: Not on file  Relationships  . Social Herbalist on phone: Not on file    Gets together: Not on file    Attends religious service: Not on file    Active member of club or organization: Not on file    Attends meetings of clubs or organizations: Not on file    Relationship status: Not on file  . Intimate partner violence    Fear of current or ex partner: Not on file    Emotionally abused: Not on file    Physically abused: Not on file    Forced sexual activity: Not on file  Other Topics Concern  . Not on file  Social History Narrative   Exercise--walks a mile 5 days a week.  One deceased child age 1.  One stepchild, 5 adopted.     Review of Systems  Constitutional: Negative.   HENT: Negative.   Respiratory: Positive for apnea.   Cardiovascular: Negative.   Gastrointestinal: Negative.   Psychiatric/Behavioral: Positive for sleep disturbance.  All other systems reviewed and are negative.   Vitals:   05/02/19 1517  BP: (!) 150/70  Pulse: 69  Temp: (!) 97.1 F (36.2 C)  SpO2: 100%     Physical Exam  Constitutional: He is oriented to person, place, and time. He appears well-developed and well-nourished.  HENT:  Head: Normocephalic and atraumatic.  Crowded oropharynx  Eyes: Pupils are equal, round, and reactive to light. Right eye exhibits no discharge.  Neck: Normal range of motion. Neck supple. No tracheal deviation present. No thyromegaly present.  Neck is thick  Cardiovascular: Normal rate and regular rhythm.  Pulmonary/Chest: Effort normal and breath sounds normal. No respiratory distress. He has no wheezes. He has no rales. He exhibits no tenderness.  Musculoskeletal: Normal range of motion.  Neurological: He is alert and oriented to person, place, and time.  Skin: Skin is warm and dry.  Psychiatric: He has a normal mood and affect.   Results of the  Epworth flowsheet 05/02/2019  Sitting and reading 2  Watching TV 2  Sitting, inactive in a public place (e.g. a theatre or a meeting) 2  As a passenger in a car for an hour without a break 1  Lying  down to rest in the afternoon when circumstances permit 3  Sitting and talking to someone 0  Sitting quietly after a lunch without alcohol 1  In a car, while stopped for a few minutes in traffic 0  Total score 11     Data Reviewed: Previous sleep study from 2014 reveals an AHI of 13.2  Assessment:  Mild obstructive sleep apnea with significant daytime symptoms  Significant symptomatology of witnessed apneas, gasping respirations at night, loud snoring -Crowded oropharynx  Pathophysiology of sleep disordered breathing discussed with the patient  Plan/Recommendations: We will schedule the patient for home sleep study  We will be informed about results  We will set him up with an auto titrating CPAP  Encouraged to focus on weight loss efforts  We will see him back in the office in about 3 months, to call with any significant concerns   Sherrilyn Rist MD [facility] 05/02/2019, 3:33 PM  CC: Midge Minium, MD

## 2019-05-02 NOTE — Patient Instructions (Signed)
History of obstructive sleep apnea  We will schedule you for a home sleep study We will inform you of results as they become available  Contact the medical supply company to supply CPAP  Continue weight loss efforts/regular exercise  Trial of a nasal steroid-examples Flonase, Nasonex  I will see you in about 3 months  Call with any significant concerns

## 2019-05-04 ENCOUNTER — Other Ambulatory Visit: Payer: Self-pay | Admitting: Family Medicine

## 2019-05-18 ENCOUNTER — Other Ambulatory Visit: Payer: Self-pay | Admitting: Family Medicine

## 2019-06-10 ENCOUNTER — Other Ambulatory Visit: Payer: Self-pay | Admitting: Family Medicine

## 2019-06-21 ENCOUNTER — Other Ambulatory Visit: Payer: Self-pay

## 2019-06-21 ENCOUNTER — Ambulatory Visit: Payer: PPO

## 2019-06-21 DIAGNOSIS — G4733 Obstructive sleep apnea (adult) (pediatric): Secondary | ICD-10-CM

## 2019-07-01 ENCOUNTER — Ambulatory Visit (INDEPENDENT_AMBULATORY_CARE_PROVIDER_SITE_OTHER): Payer: PPO | Admitting: Family Medicine

## 2019-07-01 ENCOUNTER — Encounter: Payer: Self-pay | Admitting: Family Medicine

## 2019-07-01 ENCOUNTER — Other Ambulatory Visit: Payer: Self-pay

## 2019-07-01 VITALS — BP 136/81 | HR 76 | Temp 98.3°F | Resp 16 | Wt 254.0 lb

## 2019-07-01 DIAGNOSIS — N200 Calculus of kidney: Secondary | ICD-10-CM | POA: Diagnosis not present

## 2019-07-01 DIAGNOSIS — R350 Frequency of micturition: Secondary | ICD-10-CM | POA: Diagnosis not present

## 2019-07-01 LAB — POCT URINALYSIS DIPSTICK
Bilirubin, UA: NEGATIVE
Blood, UA: NEGATIVE
Glucose, UA: NEGATIVE
Ketones, UA: NEGATIVE
Leukocytes, UA: NEGATIVE
Nitrite, UA: NEGATIVE
Protein, UA: NEGATIVE
Spec Grav, UA: 1.015 (ref 1.010–1.025)
Urobilinogen, UA: 0.2 E.U./dL
pH, UA: 6 (ref 5.0–8.0)

## 2019-07-01 MED ORDER — CIPROFLOXACIN HCL 500 MG PO TABS: 500.0000 mg | ORAL_TABLET | Freq: Two times a day (BID) | ORAL | 0 refills | Status: AC

## 2019-07-01 NOTE — Progress Notes (Signed)
   Subjective:    Patient ID: Taylor Hua., male    DOB: 08-Mar-1951, 69 y.o.   MRN: [id number]  HPI ? UTI- pt reports he had a kidney stone last week, 'at least it felt like it'.  Reports he continues to have low back pain, frequent urination, and suprapubic pressure.  Denies dysuria.  No fevers.  No blood in urine.  Pt has previously had UTI and reports this feels similar.  No pain w/ sitting.  No N/V.  Pt reports kidney stones for 'over 50 yrs'.  No recent urology visit.  Pt has had Cipro in the past.   Review of Systems For ROS see HPI   This visit occurred during the SARS-CoV-2 public health emergency.  Safety protocols were in place, including screening questions prior to the visit, additional usage of staff PPE, and extensive cleaning of exam room while observing appropriate contact time as indicated for disinfecting solutions.       Objective:   Physical Exam Vitals reviewed.  Constitutional:      General: He is not in acute distress.    Appearance: Normal appearance. He is obese. He is not ill-appearing.  HENT:     Head: Normocephalic and atraumatic.  Abdominal:     General: Abdomen is flat. There is no distension.     Palpations: Abdomen is soft.     Tenderness: There is abdominal tenderness (mild suprapubic TTP and bilateral low back tenderness). There is no right CVA tenderness or left CVA tenderness.  Skin:    General: Skin is warm and dry.  Neurological:     General: No focal deficit present.     Mental Status: He is alert.  Psychiatric:        Mood and Affect: Mood normal.        Behavior: Behavior normal.        Thought Content: Thought content normal.           Assessment & Plan:  Urinary frequency- new.  Pt has hx of both kidney stones and UTIs.  Reports he passed the stone last week but the UTI sxs have persisted.  UA WNL but urinary frequency, urgency, suprapubic pressure are all consistent w/ possible UTI.  Start Cipro x7 days as he is a male  which automatically means complicated infxn.  Send urine for cx.  If no infxn and sxs persist, will need urology referral.  Pt expressed understanding and is in agreement w/ plan.   Kidney stones- pt has hx of recurrent stones.  Feels he passed one last week.  No current urologist.  No blood in urine.  Will follow.

## 2019-07-01 NOTE — Patient Instructions (Signed)
Follow up as needed or as scheduled We'll notify you of your urine culture Continue to drink plenty of fluids START the Cipro- twice daily w/ food Call with any questions or concerns Hang in there!

## 2019-07-02 LAB — URINE CULTURE
MICRO NUMBER:: 10122745
Result:: NO GROWTH
SPECIMEN QUALITY:: ADEQUATE

## 2019-07-04 ENCOUNTER — Encounter: Payer: Self-pay | Admitting: Family Medicine

## 2019-07-04 DIAGNOSIS — N200 Calculus of kidney: Secondary | ICD-10-CM

## 2019-07-04 MED ORDER — HYDROCODONE-ACETAMINOPHEN 5-325 MG PO TABS: 1.0000 | ORAL_TABLET | Freq: Four times a day (QID) | ORAL | 0 refills | Status: DC | PRN

## 2019-07-05 DIAGNOSIS — G4733 Obstructive sleep apnea (adult) (pediatric): Secondary | ICD-10-CM | POA: Diagnosis not present

## 2019-07-06 ENCOUNTER — Telehealth: Payer: Self-pay | Admitting: Pulmonary Disease

## 2019-07-06 DIAGNOSIS — G4733 Obstructive sleep apnea (adult) (pediatric): Secondary | ICD-10-CM

## 2019-07-06 NOTE — Telephone Encounter (Signed)
Dr. Ander Slade has reviewed the home sleep test this showed moderate obstructive sleep apnea. Sleep study was performed 06/21/19.   Recommendations   Treatment options are CPAP with the settings auto 5 to 15.    Weight loss measures encouraged.   Advise against driving while sleepy & against medication with sedative side effects.    Make appointment for 3 months for compliance with download with Dr. Ander Slade.     Called and spoke with pt letting him know the results of the HST and stated to him recommendations for tx is to begin CPAP. Pt was fine with beginning CPAP so order was placed for cpap tx and f/u appt was also scheduled for pt 5/10. Nothing further needed.

## 2019-07-18 DIAGNOSIS — G4733 Obstructive sleep apnea (adult) (pediatric): Secondary | ICD-10-CM | POA: Diagnosis not present

## 2019-07-19 DIAGNOSIS — G4733 Obstructive sleep apnea (adult) (pediatric): Secondary | ICD-10-CM | POA: Diagnosis not present

## 2019-07-21 ENCOUNTER — Encounter: Payer: Self-pay | Admitting: Family Medicine

## 2019-08-08 ENCOUNTER — Other Ambulatory Visit: Payer: Self-pay | Admitting: Family Medicine

## 2019-08-15 DIAGNOSIS — G4733 Obstructive sleep apnea (adult) (pediatric): Secondary | ICD-10-CM | POA: Diagnosis not present

## 2019-08-26 ENCOUNTER — Telehealth: Payer: Self-pay | Admitting: Family Medicine

## 2019-08-26 NOTE — Progress Notes (Signed)
°  Chronic Care Management   Note  08/26/2019 Name: Taylor Harvey. MRN: [id number] DOB: 1950-09-04  [name]. is a 69 y.o. year old male who is a primary care patient of Birdie Riddle, Aundra Millet, MD. I reached out to [name] by phone today in response to a referral sent by [name]'s PCP, Midge Minium, MD.   [name] was given information about Chronic Care Management services today including:  1. CCM service includes personalized support from designated clinical staff supervised by his physician, including individualized plan of care and coordination with other care providers 2. 24/7 contact phone numbers for assistance for urgent and routine care needs. 3. Service will only be billed when office clinical staff spend 20 minutes or more in a month to coordinate care. 4. Only one practitioner may furnish and bill the service in a calendar month. 5. The patient may stop CCM services at any time (effective at the end of the month) by phone call to the office staff.   Patient agreed to services and verbal consent obtained.   Follow up plan:   Earney [place] [employer] Scheduler

## 2019-08-31 ENCOUNTER — Other Ambulatory Visit: Payer: Self-pay | Admitting: Emergency Medicine

## 2019-08-31 DIAGNOSIS — I10 Essential (primary) hypertension: Secondary | ICD-10-CM

## 2019-08-31 DIAGNOSIS — E785 Hyperlipidemia, unspecified: Secondary | ICD-10-CM

## 2019-08-31 NOTE — Patient Instructions (Addendum)
Visit Information  Goals Addressed            This Visit's Progress   . PharmD Care Plan       CARE PLAN ENTRY  Current Barriers:  . Chronic Disease Management support, education, and care coordination needs related to HTN and HLD  Pharmacist Clinical Goal(s):  Marland Kitchen Maintain BP <140/90  and LDL < 70 over next 6 months . Lose 5 lbs by 12/01/2019  Interventions: . Comprehensive medication review performed. . Diet/weight loss plan recommended. . Discussed possibly tapering off of antidepressant   Patient Self Care Activities:  . Patient verbalizes understanding of plan to lose 5 lbs over next three months and to call with any questions. . Please follow-up with [place] if wanting to stop antidepressant prior to next office visit, do not abruptly stop the citalopram.  Initial goal documentation       Taylor Harvey was given information about Chronic Care Management services today including:  1. CCM service includes personalized support from designated clinical staff supervised by his physician, including individualized plan of care and coordination with other care providers 2. 24/7 contact phone numbers for assistance for urgent and routine care needs. 3. Standard insurance, coinsurance, copays and deductibles apply for chronic care management only during months in which we provide at least 20 minutes of these services. Most insurances cover these services at 100%, however patients may be responsible for any copay, coinsurance and/or deductible if applicable. This service may help you avoid the need for more expensive face-to-face services. 4. Only one practitioner may furnish and bill the service in a calendar month. 5. The patient may stop CCM services at any time (effective at the end of the month) by phone call to the office staff.  Patient agreed to services and verbal consent obtained.   The patient verbalized understanding of instructions provided today and agreed to receive a mailed  copy of patient instruction and/or educational materials. Follow-up call ~3 mo.  Madelin Rear, Pharm.D. Clinical Pharmacist [facility] [phone]   Exercising to Lose Weight Exercise is structured, repetitive physical activity to improve fitness and health. Getting regular exercise is important for everyone. It is especially important if you are overweight. Being overweight increases your risk of heart disease, stroke, diabetes, high blood pressure, and several types of cancer. Reducing your calorie intake and exercising can help you lose weight. Exercise is usually categorized as moderate or vigorous intensity. To lose weight, most people need to do a certain amount of moderate-intensity or vigorous-intensity exercise each week. Moderate-intensity exercise  Moderate-intensity exercise is any activity that gets you moving enough to burn at least three times more energy (calories) than if you were sitting. Examples of moderate exercise include:  Walking a mile in 15 minutes.  Doing light yard work.  Biking at an easy pace. Most people should get at least 150 minutes (2 hours and 30 minutes) a week of moderate-intensity exercise to maintain their body weight. Vigorous-intensity exercise Vigorous-intensity exercise is any activity that gets you moving enough to burn at least six times more calories than if you were sitting. When you exercise at this intensity, you should be working hard enough that you are not able to carry on a conversation. Examples of vigorous exercise include:  Running.  Playing a team sport, such as football, basketball, and soccer.  Jumping rope. Most people should get at least 75 minutes (1 hour and 15 minutes) a week of vigorous-intensity exercise  to maintain their body weight. How can exercise affect me? When you exercise enough to burn more calories than you eat, you lose weight. Exercise also reduces body fat and builds  muscle. The more muscle you have, the more calories you burn. Exercise also:  Improves mood.  Reduces stress and tension.  Improves your overall fitness, flexibility, and endurance.  Increases bone strength. The amount of exercise you need to lose weight depends on:  Your age.  The type of exercise.  Any health conditions you have.  Your overall physical ability. Talk to your health care provider about how much exercise you need and what types of activities are safe for you. What actions can I take to lose weight? Nutrition   Make changes to your diet as told by your health care provider or diet and nutrition specialist (dietitian). This may include: ? Eating fewer calories. ? Eating more protein. ? Eating less unhealthy fats. ? Eating a diet that includes fresh fruits and vegetables, whole grains, low-fat dairy products, and lean protein. ? Avoiding foods with added fat, salt, and sugar.  Drink plenty of water while you exercise to prevent dehydration or heat stroke. Activity  Choose an activity that you enjoy and set realistic goals. Your health care provider can help you make an exercise plan that works for you.  Exercise at a moderate or vigorous intensity most days of the week. ? The intensity of exercise may vary from person to person. You can tell how intense a workout is for you by paying attention to your breathing and heartbeat. Most people will notice their breathing and heartbeat get faster with more intense exercise.  Do resistance training twice each week, such as: ? Push-ups. ? Sit-ups. ? Lifting weights. ? Using resistance bands.  Getting short amounts of exercise can be just as helpful as long structured periods of exercise. If you have trouble finding time to exercise, try to include exercise in your daily routine. ? Get up, stretch, and walk around every 30 minutes throughout the day. ? Go for a walk during your lunch break. ? Park your car farther  away from your destination. ? If you take public transportation, get off one stop early and walk the rest of the way. ? Make phone calls while standing up and walking around. ? Take the stairs instead of elevators or escalators.  Wear comfortable clothes and shoes with good support.  Do not exercise so much that you hurt yourself, feel dizzy, or get very short of breath. Where to find more information  U.S. Department of Health and Human Services: [employer].at  Centers for Disease Control and Prevention (CDC): http://www.wolf.info/ Contact a health care provider:  Before starting a new exercise program.  If you have questions or concerns about your weight.  If you have a medical problem that keeps you from exercising. Get help right away if you have any of the following while exercising:  Injury.  Dizziness.  Difficulty breathing or shortness of breath that does not go away when you stop exercising.  Chest pain.  Rapid heartbeat. Summary  Being overweight increases your risk of heart disease, stroke, diabetes, high blood pressure, and several types of cancer.  Losing weight happens when you burn more calories than you eat.  Reducing the amount of calories you eat in addition to getting regular moderate or vigorous exercise each week helps you lose weight. This information is not intended to replace advice given to you by your health care  provider. Make sure you discuss any questions you have with your health care provider. Document Revised: 05/25/2017 Document Reviewed: 05/25/2017 Elsevier Patient Education  [place].   High Cholesterol  High cholesterol is a condition in which the blood has high levels of a white, waxy, fat-like substance (cholesterol). The human body needs small amounts of cholesterol. The liver makes all the cholesterol that the body needs. Extra (excess) cholesterol comes from the food that we eat. Cholesterol is carried from the liver by the blood  through the blood vessels. If you have high cholesterol, deposits (plaques) may build up on the walls of your blood vessels (arteries). Plaques make the arteries narrower and stiffer. Cholesterol plaques increase your risk for heart attack and stroke. Work with your health care provider to keep your cholesterol levels in a healthy range. What increases the risk? This condition is more likely to develop in people who:  Eat foods that are high in animal fat (saturated fat) or cholesterol.  Are overweight.  Are not getting enough exercise.  Have a family history of high cholesterol. What are the signs or symptoms? There are no symptoms of this condition. How is this diagnosed? This condition may be diagnosed from the results of a blood test.  If you are older than age 18, your health care provider may check your cholesterol every 4-6 years.  You may be checked more often if you already have high cholesterol or other risk factors for heart disease. The blood test for cholesterol measures:  "Bad" cholesterol (LDL cholesterol). This is the main type of cholesterol that causes heart disease. The desired level for LDL is less than 100.  "Good" cholesterol (HDL cholesterol). This type helps to protect against heart disease by cleaning the arteries and carrying the LDL away. The desired level for HDL is 60 or higher.  Triglycerides. These are fats that the body can store or burn for energy. The desired number for triglycerides is lower than 150.  Total cholesterol. This is a measure of the total amount of cholesterol in your blood, including LDL cholesterol, HDL cholesterol, and triglycerides. A healthy number is less than 200. How is this treated? This condition is treated with diet changes, lifestyle changes, and medicines. Diet changes  This may include eating more whole grains, fruits, vegetables, nuts, and fish.  This may also include cutting back on red meat and foods that have a lot of  added sugar. Lifestyle changes  Changes may include getting at least 40 minutes of aerobic exercise 3 times a week. Aerobic exercises include walking, biking, and swimming. Aerobic exercise along with a healthy diet can help you maintain a healthy weight.  Changes may also include quitting smoking. Medicines  Medicines are usually given if diet and lifestyle changes have failed to reduce your cholesterol to healthy levels.  Your health care provider may prescribe a statin medicine. Statin medicines have been shown to reduce cholesterol, which can reduce the risk of heart disease. Follow these instructions at home: Eating and drinking If told by your health care provider:  Eat chicken (without skin), fish, veal, shellfish, ground [place] breast, and round or loin cuts of red meat.  Do not eat fried foods or fatty meats, such as hot dogs and salami.  Eat plenty of fruits, such as apples.  Eat plenty of vegetables, such as broccoli, potatoes, and carrots.  Eat beans, peas, and lentils.  Eat grains such as barley, rice, couscous, and bulgur wheat.  Eat pasta  without cream sauces.  Use skim or nonfat milk, and eat low-fat or nonfat yogurt and cheeses.  Do not eat or drink whole milk, cream, ice cream, egg yolks, or hard cheeses.  Do not eat stick margarine or tub margarines that contain trans fats (also called partially hydrogenated oils).  Do not eat saturated tropical oils, such as coconut oil and palm oil.  Do not eat cakes, cookies, crackers, or other baked goods that contain trans fats.  General instructions  Exercise as directed by your health care provider. Increase your activity level with activities such as gardening, walking, and taking the stairs.  Take over-the-counter and prescription medicines only as told by your health care provider.  Do not use any products that contain nicotine or tobacco, such as cigarettes and e-cigarettes. If you need help quitting, ask  your health care provider.  Keep all follow-up visits as told by your health care provider. This is important. Contact a health care provider if:  You are struggling to maintain a healthy diet or weight.  You need help to start on an exercise program.  You need help to stop smoking. Get help right away if:  You have chest pain.  You have trouble breathing. This information is not intended to replace advice given to you by your health care provider. Make sure you discuss any questions you have with your health care provider. Document Revised: 05/15/2017 Document Reviewed: 11/10/2015 Elsevier Patient Education  [place].

## 2019-08-31 NOTE — Progress Notes (Signed)
Chronic Care Management Pharmacy  Name: Taylor Harvey.  MRN: [id number] DOB: 08-18-1950  Chief Complaint/ HPI Taylor Hua.,  69 y.o. , male presents for their Initial CCM visit with the clinical pharmacist via telephone due to COVID-19 Pandemic.  PCP : Taylor Minium, MD  Their chronic conditions include: HLD, HTN, GERD  Office Visits:   Consult Visit:  Medications: Outpatient Encounter Medications as of 09/01/2019  Medication Sig  . ADVAIR DISKUS 250-50 MCG/DOSE AEPB INHALE 1 DOSE BY MOUTH TWICE DAILY  . aspirin 81 MG tablet Take 81 mg by mouth daily.    . citalopram (CELEXA) 20 MG tablet Take 1 tablet by mouth once daily  . esomeprazole (NEXIUM) 40 MG capsule Take 40 mg by mouth daily.  Marland Kitchen HYDROcodone-acetaminophen (NORCO/VICODIN) 5-325 MG tablet Take 1 tablet by mouth every 6 (six) hours as needed for moderate pain.  . meclizine (ANTIVERT) 25 MG tablet TAKE 1 TABLET BY MOUTH THREE TIMES DAILY AS NEEDED FOR DIZZINESS  . ondansetron (ZOFRAN) 4 MG tablet Take 1 tablet (4 mg total) by mouth every 8 (eight) hours as needed for nausea or vomiting.  . simvastatin (ZOCOR) 10 MG tablet Take 1 tablet by mouth once daily  . telmisartan (MICARDIS) 80 MG tablet Take 1 tablet by mouth once daily  . traZODone (DESYREL) 50 MG tablet TAKE 1/2 TO 1 (ONE-HALF TO ONE) TABLET BY MOUTH AT BEDTIME AS NEEDED FOR SLEEP   No facility-administered encounter medications on file as of 09/01/2019.   Current Diagnosis/Assessment:  Goals Addressed            This Visit's Progress   . PharmD Care Plan       CARE PLAN ENTRY  Current Barriers:  . Chronic Disease Management support, education, and care coordination needs related to HTN and HLD  Pharmacist Clinical Goal(s):  Marland Kitchen Maintain BP <140/90  and LDL < 70 over next 6 months . Lose 5 lbs by 12/01/2019  Interventions: . Comprehensive medication review performed. . Diet/weight loss plan recommended. . Discussed possibly tapering  off of antidepressant   Patient Self Care Activities:  . Patient verbalizes understanding of plan to lose 5 lbs over next three months and to call with any questions. . Please follow-up with [place] if wanting to stop antidepressant prior to next office visit, do not abruptly stop the citalopram.  Initial goal documentation       Hyperlipidemia   Lipid Panel     Component Value Date/Time   CHOL 108 04/14/2019 1015   TRIG 111.0 04/14/2019 1015   TRIG 89 05/01/2006 0942   HDL 31.80 (L) 04/14/2019 1015   CHOLHDL 3 04/14/2019 1015   VLDL 22.2 04/14/2019 1015   LDLCALC 54 04/14/2019 1015   LDLDIRECT 47.0 02/16/2017 1455    The ASCVD Risk score (Goff DC Jr., et al., 2013) failed to calculate for the following reasons:   The valid total cholesterol range is 130 to 320 mg/dL   Patient has failed these meds in past: n/a. Patient is currently controlled on the following medications: simvastatin 20 mg daily. Denies any side effects at this time such as muscle or muscle pain, n/v.   We discussed:  diet and exercise extensively and weight loss plan. Was walking regularly until walking partner recently passed away. Currently at 255 lbs, would like to get down 225-235 lbs. Current diet example: one or 2 packs of peanut butter crackers for breakfast, sandwich/soup for lunch/dinner.. Sweets/snacks such as icecream and  oatmeal and chocolate chip cookies between meals is main issue according to patient. Will snack 2-3x/day.   Plan Continue current medications.   Hypertension   Office blood pressures are  BP Readings from Last 3 Encounters:  07/01/19 136/81  05/02/19 (!) 150/70  04/14/19 136/74   Patient is currently controlled on the following medications: telmisartan 80 mg daily  Patient checks BP at home infrequently. Denies SOB, chest pain. Expresses worsening symptoms of dizziness as it relates to his vertigo, has not been known to run low with BP. Has checked BP while having vertigo and was  not any lower than 130s/80.   Plan Continue current medications and control with diet and exercise. Patient has appointment already scheduled with Dr. Birdie Riddle this afternoon to discuss vertigo.    GERD  Patient denies GERD associated symptoms including dysphagia, heartburn or nausea over the past month. Currently takes esomeprazole 40 mg daily. Expresses understanding to avoid triggers.   Plan  Continue current medications.  Depression  Patient is currently controlled on citalopram 20 mg daily. Denies any side effects from current medication. Wonders if he could maybe get off of this medication eventually. Was started on medication while grieving 2 years ago. Feels that things have been going well and medication is not hurting him. Counseled patient on antidepressant risk/benefit. He will further consider over next three months. Understands to not stop taking abruptly.  Plan Continue current medication. Consider tapering off of antidepressant if appropriate at follow-up.   Vaccines  Reviewed and discussed patient's vaccination history. Is due for Shingrix 2-dose series.  Immunization History  Administered Date(s) Administered  . Influenza Split 03/29/2012  . Influenza Whole 03/30/2009, 02/24/2011  . Influenza, High Dose Seasonal PF 02/19/2019  . Influenza,inj,Quad PF,6+ Mos 03/21/2013, 03/17/2014, 03/20/2016, 02/16/2017, 01/14/2018  . Pneumococcal Conjugate-13 07/19/2014  . Pneumococcal Polysaccharide-23 01/07/2016  . Td 03/04/2007, 09/22/2018  . Zoster 03/29/2012   Plan Recommended patient receive Shingrix vaccine in pharmacy.   Visit Information  [name] was given information about Chronic Care Management services today including:  1. CCM service includes personalized support from designated clinical staff supervised by his physician, including individualized plan of care and coordination with other care providers 2. 24/7 contact phone numbers for assistance for urgent and  routine care needs. 3. Standard insurance, coinsurance, copays and deductibles apply for chronic care management only during months in which we provide at least 20 minutes of these services. Most insurances cover these services at 100%, however patients may be responsible for any copay, coinsurance and/or deductible if applicable. This service may help you avoid the need for more expensive face-to-face services. 4. Only one practitioner may furnish and bill the service in a calendar month. 5. The patient may stop CCM services at any time (effective at the end of the month) by phone call to the office staff.  Patient agreed to services and verbal consent obtained.   Madelin Rear, Pharm.D. Clinical Pharmacist [facility] [phone]

## 2019-09-01 ENCOUNTER — Other Ambulatory Visit: Payer: Self-pay

## 2019-09-01 ENCOUNTER — Encounter: Payer: Self-pay | Admitting: Family Medicine

## 2019-09-01 ENCOUNTER — Ambulatory Visit: Payer: PPO

## 2019-09-01 ENCOUNTER — Telehealth (INDEPENDENT_AMBULATORY_CARE_PROVIDER_SITE_OTHER): Payer: PPO | Admitting: Family Medicine

## 2019-09-01 DIAGNOSIS — R42 Dizziness and giddiness: Secondary | ICD-10-CM | POA: Diagnosis not present

## 2019-09-01 DIAGNOSIS — E785 Hyperlipidemia, unspecified: Secondary | ICD-10-CM

## 2019-09-01 DIAGNOSIS — I10 Essential (primary) hypertension: Secondary | ICD-10-CM

## 2019-09-01 NOTE — Progress Notes (Signed)
I have discussed the procedure for the virtual visit with the patient who has given consent to proceed with assessment and treatment.   Pt unable to obtain vitals.   Taylor Harvey, CMA     

## 2019-09-01 NOTE — Progress Notes (Signed)
Virtual Visit via Video   I connected with patient on 09/01/19 at  1:30 PM EDT by a video enabled telemedicine application and verified that I am speaking with the correct person using two identifiers.  Location patient: Home Location provider: Acupuncturist, Office Persons participating in the virtual visit: Patient, Provider, [place] (Jess B)  I discussed the limitations of evaluation and management by telemedicine and the availability of in person appointments. The patient expressed understanding and agreed to proceed.  Subjective:   HPI:   Vertigo- pt has hx of similar.  Pt denies spinning, 'it just feels more light headed'.  Mild nausea, 'not enough to take the medicine' (Zofran).  Has had daily sxs x10 days.  Asymptomatic if sitting still.  Sxs resolve by 2-3pm.  Occurs w/ position changes- getting out of bed, rising from a seated position. Will also have sxs w/ turning head quickly.  Drinking 'maybe 16 oz daily'.    ROS:   See pertinent positives and negatives per HPI.  Patient Active Problem List   Diagnosis Date Noted  . GERD (gastroesophageal reflux disease) 04/14/2019  . Status post arthroscopy of left shoulder 10/15/2017  . Lateral epicondylitis, left elbow 04/08/2017  . Chronic left shoulder pain 02/17/2017  . Pain in left elbow 02/17/2017  . Left upper arm pain 01/20/2017  . Insomnia 07/19/2014  . Urinary frequency 07/19/2014  . Physical exam 07/19/2014  . Morbid obesity ([place]) 06/28/2013  . Hyperlipidemia 11/24/2008  . ANXIETY STATE, UNSPECIFIED 06/02/2008  . NEOPLASM, SKIN, UNCERTAIN BEHAVIOR 0000000  . TRIGGER FINGER 03/02/2008  . BACK PAIN WITH RADICULOPATHY 03/02/2008  . BACK PAIN 07/15/2007  . DYSPNEA ON EXERTION 11/09/2006  . POLYP, COLON 07/19/2006  . Essential hypertension 07/19/2006  . Sleep apnea 07/19/2006  . DIVERTICULOSIS, ASYMPTOMATIC 06/11/2006    Social History   Tobacco Use  . Smoking status: Never Smoker  . Smokeless tobacco:  Never Used  Substance Use Topics  . Alcohol use: No    Current Outpatient Medications:  .  ADVAIR DISKUS 250-50 MCG/DOSE AEPB, INHALE 1 DOSE BY MOUTH TWICE DAILY, Disp: 60 each, Rfl: 0 .  aspirin 81 MG tablet, Take 81 mg by mouth daily.  , Disp: , Rfl:  .  citalopram (CELEXA) 20 MG tablet, Take 1 tablet by mouth once daily, Disp: 90 tablet, Rfl: 0 .  esomeprazole (NEXIUM) 40 MG capsule, Take 40 mg by mouth daily., Disp: , Rfl:  .  HYDROcodone-acetaminophen (NORCO/VICODIN) 5-325 MG tablet, Take 1 tablet by mouth every 6 (six) hours as needed for moderate pain., Disp: 30 tablet, Rfl: 0 .  meclizine (ANTIVERT) 25 MG tablet, TAKE 1 TABLET BY MOUTH THREE TIMES DAILY AS NEEDED FOR DIZZINESS, Disp: 45 tablet, Rfl: 0 .  ondansetron (ZOFRAN) 4 MG tablet, Take 1 tablet (4 mg total) by mouth every 8 (eight) hours as needed for nausea or vomiting., Disp: 20 tablet, Rfl: 0 .  simvastatin (ZOCOR) 10 MG tablet, Take 1 tablet by mouth once daily, Disp: 90 tablet, Rfl: 0 .  telmisartan (MICARDIS) 80 MG tablet, Take 1 tablet by mouth once daily, Disp: 90 tablet, Rfl: 0 .  traZODone (DESYREL) 50 MG tablet, TAKE 1/2 TO 1 (ONE-HALF TO ONE) TABLET BY MOUTH AT BEDTIME AS NEEDED FOR SLEEP, Disp: 30 tablet, Rfl: 2  No Known Allergies  Objective:   There were no vitals taken for this visit. AAOx3, NAD NCAT, EOMI No obvious CN deficits Coloring WNL Pt is able to speak clearly, coherently without shortness  of breath or increased work of breathing.  Thought process is linear.  Mood is appropriate.   Assessment and Plan:   Dizziness upon standing- new.  pt reports this feels different than his usual vertigo.  Sxs tend to occur w/ rising from a seated position.  Sxs tend to resolve by mid-afternoon.  Sxs started ~10 days ago when weather warmed and he reports drinking 'maybe 16 oz' a day.  Told him this is not enough fluids and he needs to increase his water intake and change positions slowly.  Encouraged him to  check home BP and if it is consistently 110 or lower, we will need to decrease his Micardis.  Will follow closely.  Pt expressed understanding and is in agreement w/ plan.    [name], MD 09/01/2019

## 2019-09-03 ENCOUNTER — Other Ambulatory Visit: Payer: Self-pay | Admitting: Family Medicine

## 2019-09-05 ENCOUNTER — Other Ambulatory Visit: Payer: Self-pay | Admitting: Family Medicine

## 2019-09-15 DIAGNOSIS — G4733 Obstructive sleep apnea (adult) (pediatric): Secondary | ICD-10-CM | POA: Diagnosis not present

## 2019-10-03 ENCOUNTER — Ambulatory Visit: Payer: PPO | Admitting: Primary Care

## 2019-10-04 ENCOUNTER — Ambulatory Visit: Payer: PPO | Admitting: Pulmonary Disease

## 2019-10-04 ENCOUNTER — Other Ambulatory Visit: Payer: Self-pay

## 2019-10-04 ENCOUNTER — Encounter: Payer: Self-pay | Admitting: Pulmonary Disease

## 2019-10-04 VITALS — BP 118/56 | HR 67 | Temp 97.2°F | Ht 66.0 in | Wt 258.2 lb

## 2019-10-04 DIAGNOSIS — G4733 Obstructive sleep apnea (adult) (pediatric): Secondary | ICD-10-CM | POA: Diagnosis not present

## 2019-10-04 DIAGNOSIS — Z9989 Dependence on other enabling machines and devices: Secondary | ICD-10-CM

## 2019-10-04 NOTE — Patient Instructions (Signed)
Obstructive sleep apnea  Sleep apnea is well treated with CPAP therapy at present No changes need made  Encourage exercises on a regular basis  Call with significant concerns We will see you back in 6 months

## 2019-10-04 NOTE — Progress Notes (Signed)
Taylor Harvey    [id number]    [birth year]  Primary Care Physician:Tabori, Aundra Millet, MD  Referring Physician: Midge Minium, MD [address] [place],  [place] [address]  Chief complaint:   Patient with a history of obstructive sleep apnea diagnosed 2014  HPI:  New machine, has been using CPAP on a regular basis Feeling better generally  He does have an occasional cough, usually happens around his allergy season He is not feeling acutely ill  Sleep habits have not changed much  No family history of obstructive sleep apnea  Used to exercise regularly has gained about 10 to 20 pounds in the last year  Usually goes to bed between 1030 and 1130 Takes him about 30 minutes to 45 minutes to fall asleep 1-2 awakenings  Final wake up time about 6 AM  No pertinent occupational history No significant exposures   Outpatient Encounter Medications as of 10/04/2019  Medication Sig  . ADVAIR DISKUS 250-50 MCG/DOSE AEPB INHALE 1 DOSE BY MOUTH TWICE DAILY  . aspirin 81 MG tablet Take 81 mg by mouth daily.    . citalopram (CELEXA) 20 MG tablet Take 1 tablet by mouth once daily  . esomeprazole (NEXIUM) 40 MG capsule Take 40 mg by mouth daily.  Marland Kitchen HYDROcodone-acetaminophen (NORCO/VICODIN) 5-325 MG tablet Take 1 tablet by mouth every 6 (six) hours as needed for moderate pain.  . meclizine (ANTIVERT) 25 MG tablet TAKE 1 TABLET BY MOUTH THREE TIMES DAILY AS NEEDED FOR DIZZINESS  . ondansetron (ZOFRAN) 4 MG tablet Take 1 tablet (4 mg total) by mouth every 8 (eight) hours as needed for nausea or vomiting.  . simvastatin (ZOCOR) 10 MG tablet Take 1 tablet by mouth once daily  . telmisartan (MICARDIS) 80 MG tablet Take 1 tablet by mouth once daily  . traZODone (DESYREL) 50 MG tablet TAKE 1/2 TO 1 (ONE-HALF TO ONE) TABLET BY MOUTH AT BEDTIME AS NEEDED FOR SLEEP   No facility-administered encounter medications on file as of 10/04/2019.    Allergies as of 10/04/2019    . (No Known Allergies)    Past Medical History:  Diagnosis Date  . Allergy   . Anxiety   . Arthritis   . Chronic kidney disease    hx kidney stones  . Depression   . GERD (gastroesophageal reflux disease)   . Hyperlipemia   . Hypertension   . Sleep apnea 2005   moderate-has cpap-not used in 3 yr    Past Surgical History:  Procedure Laterality Date  . COLONOSCOPY    . INSERTION OF MESH  05/05/2012   Procedure: INSERTION OF MESH;  Surgeon: Rolm Bookbinder, MD;  Location: [facility];  Service: General;  Laterality: N/A;  . LITHOTRIPSY     x2  . SHOULDER ARTHROSCOPY  07/2017  . trigger finger surgery  2006  . UMBILICAL HERNIA REPAIR  05/05/2012   Procedure: HERNIA REPAIR UMBILICAL ADULT;  Surgeon: Rolm Bookbinder, MD;  Location: [facility];  Service: General;  Laterality: N/A;    Family History  Problem Relation Age of Onset  . Diabetes Mother   . Pancreatitis Mother   . Heart disease Mother 31       MI, died with MI  . Stroke Father   . Hypertension Father   . Peripheral vascular disease Father        Carotid surgery  . Heart disease Sister  Arrhythmia  . Diabetes Sister 56       DM  . Kidney cancer Sister   . Diabetes Sister   . Cancer Sister        colon  . Heart disease Sister   . Diabetes Sister   . Heart disease Sister   . Stroke Brother   . Colon cancer Sister   . Pancreatic cancer Other     Social History   Socioeconomic History  . Marital status: Married    Spouse name: Not on file  . Number of children: 6  . Years of education: Not on file  . Highest education level: Not on file  Occupational History  . Occupation: retired  Tobacco Use  . Smoking status: Never Smoker  . Smokeless tobacco: Never Used  Substance and Sexual Activity  . Alcohol use: No  . Drug use: No  . Sexual activity: Yes    Partners: Female  Other Topics Concern  . Not on file  Social History Narrative   Exercise--walks a  mile 5 days a week.  One deceased child age 68.  One stepchild, 5 adopted.    Social Determinants of Health   Financial Resource Strain:   . Difficulty of Paying Living Expenses:   Food Insecurity: No Food Insecurity  . Worried About Charity fundraiser in the Last Year: Never true  . Ran Out of Food in the Last Year: Never true  Transportation Needs: No Transportation Needs  . Lack of Transportation (Medical): No  . Lack of Transportation (Non-Medical): No  Physical Activity:   . Days of Exercise per Week:   . Minutes of Exercise per Session:   Stress:   . Feeling of Stress :   Social Connections:   . Frequency of Communication with Friends and Family:   . Frequency of Social Gatherings with Friends and Family:   . Attends Religious Services:   . Active Member of Clubs or Organizations:   . Attends Archivist Meetings:   Marland Kitchen Marital Status:   Intimate Partner Violence:   . Fear of Current or Ex-Partner:   . Emotionally Abused:   Marland Kitchen Physically Abused:   . Sexually Abused:     Review of Systems  Constitutional: Negative.   HENT: Negative.   Respiratory: Positive for apnea.   Cardiovascular: Negative.   Gastrointestinal: Negative.   Psychiatric/Behavioral: Positive for sleep disturbance.  All other systems reviewed and are negative.   Vitals:   10/04/19 0956  BP: (!) 118/56  Pulse: 67  Temp: (!) 97.2 F (36.2 C)  SpO2: 97%     Physical Exam  Constitutional: He appears well-developed and well-nourished.  HENT:  Head: Normocephalic and atraumatic.  Crowded oropharynx  Eyes: Pupils are equal, round, and reactive to light. Right eye exhibits no discharge.  Neck: No tracheal deviation present. No thyromegaly present.  Neck is thick  Cardiovascular: Normal rate and regular rhythm.  Pulmonary/Chest: Effort normal and breath sounds normal. No respiratory distress. He has no wheezes. He has no rales. He exhibits no tenderness.  Skin: Skin is warm and dry.   Psychiatric: He has a normal mood and affect.   Results of the Epworth flowsheet 05/02/2019  Sitting and reading 2  Watching TV 2  Sitting, inactive in a public place (e.g. a theatre or a meeting) 2  As a passenger in a car for an hour without a break 1  Lying down to rest in the afternoon when circumstances permit 3  Sitting and talking to someone 0  Sitting quietly after a lunch without alcohol 1  In a car, while stopped for a few minutes in traffic 0  Total score 11     Data Reviewed: Previous sleep study from 2014 reveals an AHI of 13.2 Compliance data reveals 100% compliance Machine set 5-15 95 percentile pressure 13.9 Residual AHI 1.6  Assessment:  Mild obstructive sleep apnea with significant daytime symptoms -Adequately treated with CPAP therapy at present -Excellent compliance with CPAP use  Obesity -Importance of weight loss and exercise discussed  Cough -Monitor  Plan/Recommendations: Continue CPAP on a nightly basis Call with any significant concerns Follow-up in 6 months  Sherrilyn Rist MD [facility] 10/04/2019, 10:02 AM  CC: Midge Minium, MD

## 2019-10-05 ENCOUNTER — Other Ambulatory Visit: Payer: Self-pay | Admitting: Family Medicine

## 2019-10-12 ENCOUNTER — Ambulatory Visit: Payer: PPO | Admitting: Family Medicine

## 2019-10-13 ENCOUNTER — Other Ambulatory Visit: Payer: Self-pay | Admitting: Family Medicine

## 2019-10-15 DIAGNOSIS — G4733 Obstructive sleep apnea (adult) (pediatric): Secondary | ICD-10-CM | POA: Diagnosis not present

## 2019-10-21 ENCOUNTER — Encounter: Payer: Self-pay | Admitting: Family Medicine

## 2019-10-21 ENCOUNTER — Other Ambulatory Visit: Payer: Self-pay

## 2019-10-21 ENCOUNTER — Ambulatory Visit (INDEPENDENT_AMBULATORY_CARE_PROVIDER_SITE_OTHER): Payer: PPO | Admitting: Family Medicine

## 2019-10-21 VITALS — BP 122/86 | HR 89 | Temp 97.9°F | Resp 17 | Ht 67.0 in | Wt 255.1 lb

## 2019-10-21 DIAGNOSIS — S20211A Contusion of right front wall of thorax, initial encounter: Secondary | ICD-10-CM

## 2019-10-21 MED ORDER — MELOXICAM 15 MG PO TABS: 15.0000 mg | ORAL_TABLET | Freq: Every day | ORAL | 0 refills | Status: DC

## 2019-10-21 NOTE — Progress Notes (Signed)
   Subjective:    Patient ID: Taylor Hua., Taylor Harvey    DOB: 1951/05/18, 69 y.o.   MRN: [id number]  HPI Fall- pt was having a dream in which he was being chased and he fell out of bed.  Occurred Wednesday night.  Does not think he hit head.  Pt reports R rib under nipple is painful.  Painful to touch and w/ certain movements.  Able to breathe w/o difficulty.  No hematoma, bruising, or palpable lump.  Pt says that since pain is not constant he hasn't done any home treatments.   Review of Systems For ROS see HPI   This visit occurred during the SARS-CoV-2 public health emergency.  Safety protocols were in place, including screening questions prior to the visit, additional usage of staff PPE, and extensive cleaning of exam room while observing appropriate contact time as indicated for disinfecting solutions.       Objective:   Physical Exam Vitals reviewed.  Constitutional:      General: He is not in acute distress.    Appearance: Normal appearance. He is obese. He is not ill-appearing.  HENT:     Head: Normocephalic and atraumatic.  Musculoskeletal:        General: Tenderness (TTP over R rib just inferior to nipple but no obvious step off or deformity) present. No swelling or deformity.  Skin:    General: Skin is warm and dry.     Findings: No erythema or rash.  Neurological:     General: No focal deficit present.     Mental Status: He is alert and oriented to person, place, and time.     Motor: No weakness.     Coordination: Coordination normal.     Gait: Gait normal.           Assessment & Plan:  Bruised rib- no step off or evidence of fx at this time.  Able to breathe normally.  Able to move normally.  Start short course of anti-inflammatories and pt to monitor for improvement.

## 2019-10-21 NOTE — Patient Instructions (Signed)
Follow up as needed or as scheduled START the Meloxicam once daily x5-7 days- take w/ food ICE if needed Call with any questions or concerns [place] [facility]!!

## 2019-11-02 ENCOUNTER — Other Ambulatory Visit: Payer: Self-pay | Admitting: Family Medicine

## 2019-11-04 ENCOUNTER — Other Ambulatory Visit: Payer: Self-pay | Admitting: Family Medicine

## 2019-11-11 ENCOUNTER — Encounter: Payer: Self-pay | Admitting: Physician Assistant

## 2019-11-11 ENCOUNTER — Telehealth (INDEPENDENT_AMBULATORY_CARE_PROVIDER_SITE_OTHER): Payer: PPO | Admitting: Physician Assistant

## 2019-11-11 ENCOUNTER — Other Ambulatory Visit: Payer: Self-pay

## 2019-11-11 VITALS — Ht 67.0 in | Wt 255.0 lb

## 2019-11-11 DIAGNOSIS — B9689 Other specified bacterial agents as the cause of diseases classified elsewhere: Secondary | ICD-10-CM

## 2019-11-11 DIAGNOSIS — J208 Acute bronchitis due to other specified organisms: Secondary | ICD-10-CM

## 2019-11-11 MED ORDER — BENZONATATE 100 MG PO CAPS: 100.0000 mg | ORAL_CAPSULE | Freq: Three times a day (TID) | ORAL | 0 refills | Status: DC | PRN

## 2019-11-11 MED ORDER — DOXYCYCLINE HYCLATE 100 MG PO TABS: 100.0000 mg | ORAL_TABLET | Freq: Two times a day (BID) | ORAL | 0 refills | Status: DC

## 2019-11-11 NOTE — Patient Instructions (Addendum)
Instructions sent to MyChart

## 2019-11-11 NOTE — Progress Notes (Signed)
Virtual Visit via Video   I connected with patient on 11/11/19 at  9:00 AM EDT by a video enabled telemedicine application and verified that I am speaking with the correct person using two identifiers.  Location patient: Home Location provider: [facility], Office Persons participating in the virtual visit: Patient, Provider, [place] (Patina Moore)  I discussed the limitations of evaluation and management by telemedicine and the availability of in person appointments. The patient expressed understanding and agreed to proceed.  Subjective:   HPI:   Patient presents via [facility] today complaining of greater than 1 week of chest congestion and cough that was initially dry but is now productive of thick yellow sputum.  Denies fever or chills but notes fatigue.  Has noted some nasal congestion and postnasal drip with scratchy throat, but states this has mostly resolved.  Denies chest pain or shortness of breath.  Denies recent travel or sick contact.  Denies loss of taste or smell.  Notes his chest congestion and cough are continuing to worsen.  Has been taking over-the-counter Delsym which helps somewhat.  ROS:   See pertinent positives and negatives per HPI.  Patient Active Problem List   Diagnosis Date Noted  . GERD (gastroesophageal reflux disease) 04/14/2019  . Status post arthroscopy of left shoulder 10/15/2017  . Lateral epicondylitis, left elbow 04/08/2017  . Chronic left shoulder pain 02/17/2017  . Pain in left elbow 02/17/2017  . Left upper arm pain 01/20/2017  . Insomnia 07/19/2014  . Urinary frequency 07/19/2014  . Physical exam 07/19/2014  . Morbid obesity ([place]) 06/28/2013  . Hyperlipidemia 11/24/2008  . ANXIETY STATE, UNSPECIFIED 06/02/2008  . NEOPLASM, SKIN, UNCERTAIN BEHAVIOR 64/33/2951  . TRIGGER FINGER 03/02/2008  . BACK PAIN WITH RADICULOPATHY 03/02/2008  . BACK PAIN 07/15/2007  . DYSPNEA ON EXERTION 11/09/2006  . POLYP, COLON 07/19/2006  . Essential  hypertension 07/19/2006  . Sleep apnea 07/19/2006  . DIVERTICULOSIS, ASYMPTOMATIC 06/11/2006    Social History   Tobacco Use  . Smoking status: Never Smoker  . Smokeless tobacco: Never Used  Substance Use Topics  . Alcohol use: No    Current Outpatient Medications:  .  ADVAIR DISKUS 250-50 MCG/DOSE AEPB, INHALE 1 DOSE BY MOUTH TWICE DAILY, Disp: 60 each, Rfl: 0 .  aspirin 81 MG tablet, Take 81 mg by mouth daily.  , Disp: , Rfl:  .  citalopram (CELEXA) 20 MG tablet, Take 1 tablet by mouth once daily, Disp: 90 tablet, Rfl: 0 .  esomeprazole (NEXIUM) 40 MG capsule, Take 40 mg by mouth daily., Disp: , Rfl:  .  HYDROcodone-acetaminophen (NORCO/VICODIN) 5-325 MG tablet, Take 1 tablet by mouth every 6 (six) hours as needed for moderate pain., Disp: 30 tablet, Rfl: 0 .  meclizine (ANTIVERT) 25 MG tablet, TAKE 1 TABLET BY MOUTH THREE TIMES DAILY AS NEEDED FOR DIZZINESS, Disp: 45 tablet, Rfl: 0 .  meloxicam (MOBIC) 15 MG tablet, Take 1 tablet (15 mg total) by mouth daily., Disp: 30 tablet, Rfl: 0 .  ondansetron (ZOFRAN) 4 MG tablet, TAKE 1 TABLET BY MOUTH EVERY 8 HOURS AS NEEDED FOR NAUSEA FOR VOMITING, Disp: 20 tablet, Rfl: 0 .  simvastatin (ZOCOR) 10 MG tablet, Take 1 tablet by mouth once daily, Disp: 90 tablet, Rfl: 0 .  telmisartan (MICARDIS) 80 MG tablet, Take 1 tablet by mouth once daily, Disp: 90 tablet, Rfl: 0 .  traZODone (DESYREL) 50 MG tablet, TAKE 1/2 TO 1 (ONE-HALF TO ONE) TABLET BY MOUTH ONCE DAILY AT BEDTIME AS NEEDED  FOR SLEEP, Disp: 30 tablet, Rfl: 0  No Known Allergies  Objective:   Ht 5\' 7"  (1.702 m)   Wt 255 lb (115.7 kg)   BMI 39.94 kg/m   Patient is well-developed, well-nourished in no acute distress.  Resting comfortably at home.  Head is normocephalic, atraumatic.  No labored breathing.  Speech is clear and coherent with logical content.  Patient is alert and oriented at baseline.   Assessment and Plan:   1. Acute bacterial bronchitis Rx Doxycycline.   Increase fluids.  Rest.  Saline nasal spray.  Probiotic.  Mucinex as directed.  Humidifier in bedroom. Tessalon per orders.  Call or return to clinic if symptoms are not improving.  - doxycycline (VIBRA-TABS) 100 MG tablet; Take 1 tablet (100 mg total) by mouth 2 (two) times daily.  Dispense: 14 tablet; Refill: 0 - benzonatate (TESSALON) 100 MG capsule; Take 1 capsule (100 mg total) by mouth 3 (three) times daily as needed for cough.  Dispense: 30 capsule; Refill: 0 .   Leeanne Rio, PA-C 11/11/2019

## 2019-11-15 ENCOUNTER — Other Ambulatory Visit: Payer: Self-pay | Admitting: Family Medicine

## 2019-11-15 DIAGNOSIS — G4733 Obstructive sleep apnea (adult) (pediatric): Secondary | ICD-10-CM | POA: Diagnosis not present

## 2019-11-28 ENCOUNTER — Other Ambulatory Visit: Payer: Self-pay | Admitting: Family Medicine

## 2019-11-29 NOTE — Telephone Encounter (Signed)
Trazodone LFD 10/05/19 #30 with no refills LOV 11/11/19 NOV none

## 2019-11-30 NOTE — Progress Notes (Signed)
Chronic Care Management Pharmacy  Name: Taylor Harvey.  MRN: [id number] DOB: 27-Sep-1950  Chief Complaint/ HPI Taylor Hua.,  69 y.o. , male presents for their Initial CCM visit with the clinical pharmacist via telephone due to COVID-19 Pandemic.  Reports current weight 248 lbs, down 7 lbs from last [facility] visit. 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   Reports vertigo has improved since last OV.  PCP : Midge Minium, MD  Their chronic conditions include: HLD, HTN, GERD  Office Visits: 11/11/2019 [facility] [place]): Acute bacterial bronchitis. Doxycycline, benzonatate, mucinex, probiotic.  10/21/2019 (PCP): Contusion of rib on right side. Short term meloxicam.  09/01/2019 (PCP): Vertigo. C/o feeling more light headed than normal. Occurring with position changes. Drinking ~16 oz water/day. Home bp monitoring encouraged.   Patient Active Problem List   Diagnosis Date Noted  . GERD (gastroesophageal reflux disease) 04/14/2019  . Status post arthroscopy of left shoulder 10/15/2017  . Lateral epicondylitis, left elbow 04/08/2017  . Chronic left shoulder pain 02/17/2017  . Pain in left elbow 02/17/2017  . Left upper arm pain 01/20/2017  . Insomnia 07/19/2014  . Urinary frequency 07/19/2014  . Physical exam 07/19/2014  . Morbid obesity ([place]) 06/28/2013  . Hyperlipidemia 11/24/2008  . ANXIETY STATE, UNSPECIFIED 06/02/2008  . NEOPLASM, SKIN, UNCERTAIN BEHAVIOR 03/11/5101  . TRIGGER FINGER 03/02/2008  . BACK PAIN WITH RADICULOPATHY 03/02/2008  . BACK PAIN 07/15/2007  . DYSPNEA ON EXERTION 11/09/2006  . POLYP, COLON 07/19/2006  . Essential hypertension 07/19/2006  . Sleep apnea 07/19/2006  . DIVERTICULOSIS, ASYMPTOMATIC 06/11/2006   Past Surgical History:  Procedure Laterality Date  . COLONOSCOPY    . INSERTION OF MESH  05/05/2012   Procedure: INSERTION OF MESH;  Surgeon: Rolm Bookbinder, MD;  Location: [place];  Service: General;  Laterality:  N/A;  . LITHOTRIPSY     x2  . SHOULDER ARTHROSCOPY  07/2017  . trigger finger surgery  2006  . UMBILICAL HERNIA REPAIR  05/05/2012   Procedure: HERNIA REPAIR UMBILICAL ADULT;  Surgeon: Rolm Bookbinder, MD;  Location: [place];  Service: General;  Laterality: N/A;   Social History   Socioeconomic History  . Marital status: Married    Spouse name: Not on file  . Number of children: 6  . Years of education: Not on file  . Highest education level: Not on file  Occupational History  . Occupation: retired  Tobacco Use  . Smoking status: Never Smoker  . Smokeless tobacco: Never Used  Vaping Use  . Vaping Use: Never used  Substance and Sexual Activity  . Alcohol use: No  . Drug use: No  . Sexual activity: Yes    Partners: Female  Other Topics Concern  . Not on file  Social History Narrative   Exercise--walks a mile 5 days a week.  One deceased child age 55.  One stepchild, 5 adopted.    Social Determinants of Health   Financial Resource Strain:   . Difficulty of Paying Living Expenses:   Food Insecurity: No Food Insecurity  . Worried About Charity fundraiser in the Last Year: Never true  . Ran Out of Food in the Last Year: Never true  Transportation Needs: No Transportation Needs  . Lack of Transportation (Medical): No  . Lack of Transportation (Non-Medical): No  Physical Activity:   . Days of Exercise per Week:   . Minutes of Exercise per Session:   Stress:   . Feeling  of Stress :   Social Connections:   . Frequency of Communication with Friends and Family:   . Frequency of Social Gatherings with Friends and Family:   . Attends Religious Services:   . Active Member of Clubs or Organizations:   . Attends Archivist Meetings:   Marland Kitchen Marital Status:    Family History  Problem Relation Age of Onset  . Diabetes Mother   . Pancreatitis Mother   . Heart disease Mother 83       MI, died with MI  . Stroke Father   . Hypertension Father   .  Peripheral vascular disease Father        Carotid surgery  . Heart disease Sister        Arrhythmia  . Diabetes Sister 74       DM  . Kidney cancer Sister   . Diabetes Sister   . Cancer Sister        colon  . Heart disease Sister   . Diabetes Sister   . Heart disease Sister   . Stroke Brother   . Colon cancer Sister   . Pancreatic cancer Other    No Known Allergies Outpatient Encounter Medications as of 12/02/2019  Medication Sig  . ADVAIR DISKUS 250-50 MCG/DOSE AEPB INHALE 1 DOSE BY MOUTH TWICE DAILY  . aspirin 81 MG tablet Take 81 mg by mouth daily.    . citalopram (CELEXA) 20 MG tablet Take 1 tablet by mouth once daily  . esomeprazole (NEXIUM) 40 MG capsule Take 40 mg by mouth daily.  Marland Kitchen HYDROcodone-acetaminophen (NORCO/VICODIN) 5-325 MG tablet Take 1 tablet by mouth every 6 (six) hours as needed for moderate pain.  . meclizine (ANTIVERT) 25 MG tablet TAKE 1 TABLET BY MOUTH THREE TIMES DAILY AS NEEDED FOR DIZZINESS  . ondansetron (ZOFRAN) 4 MG tablet TAKE 1 TABLET BY MOUTH EVERY 8 HOURS AS NEEDED FOR NAUSEA FOR VOMITING  . simvastatin (ZOCOR) 10 MG tablet Take 1 tablet by mouth once daily  . telmisartan (MICARDIS) 80 MG tablet Take 1 tablet by mouth once daily  . traZODone (DESYREL) 50 MG tablet TAKE 1/2 TO 1 (ONE-HALF TO ONE) TABLET BY MOUTH ONCE DAILY AT BEDTIME AS NEEDED FOR SLEEP  . benzonatate (TESSALON) 100 MG capsule Take 1 capsule (100 mg total) by mouth 3 (three) times daily as needed for cough. (Patient not taking: Reported on 12/02/2019)  . doxycycline (VIBRA-TABS) 100 MG tablet Take 1 tablet (100 mg total) by mouth 2 (two) times daily. (Patient not taking: Reported on 12/02/2019)  . meloxicam (MOBIC) 15 MG tablet Take 1 tablet (15 mg total) by mouth daily. (Patient not taking: Reported on 12/02/2019)   No facility-administered encounter medications on file as of 12/02/2019.   Patient Care Team    Relationship Specialty Notifications Start End  Midge Minium, MD PCP -  General Family Medicine  03/17/14    Comment: Gwendlyn Deutscher, MD Consulting Physician Gastroenterology  07/21/16   Mcarthur Rossetti, MD Consulting Physician Orthopedic Surgery  07/23/17   Madelin Rear, [facility] Pharmacist Pharmacist  08/26/19    Comment: phone number [phone]   Current Diagnosis/Assessment:  Goals Addressed            This Visit's Progress   . PharmD Care Plan       CARE PLAN ENTRY  Current Barriers:  . Chronic Disease Management support, education, and care coordination needs related to HTN and HLD  Pharmacist Clinical Goal(s):  .  Maintain BP <140/90  and LDL < 70 over next 6 months . Previous goal of losing 5 lbs by 12/01/2019 o Down 7 lbs to 248 lbs 12/02/2019.  . 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 Comprehensive medication review performed. . Diet/weight loss plan recommended. . Coordination medication management services.  Patient Self Care Activities:  . Patient verbalizes understanding of plan to call with any questions.  Initial goal documentation.      Hyperlipidemia   LDL goal < 100     Component Value Date/Time   CHOL 108 04/14/2019 1015   TRIG 111.0 04/14/2019 1015   TRIG 89 05/01/2006 0942   HDL 31.80 (L) 04/14/2019 1015   CHOLHDL 3 04/14/2019 1015   VLDL 22.2 04/14/2019 1015   LDLCALC 54 04/14/2019 1015   LDLDIRECT 47.0 02/16/2017 1455    The ASCVD Risk score (Goff DC Jr., et al., 2013) failed to calculate for the following reasons:   The valid total cholesterol range is 130 to 320 mg/dL   LDL controlled 03/2019. Patient has failed these meds in past: n/a. Denies any side effects at this time such as muscle or muscle pain, n/v. Patient is currently taking:  Simvastatin 20 mg daily.   We discussed:  Diet and exercise recommendations.   Plan Continue current medications.   Hypertension   BP goal < 140/90  Office blood pressures are  BP Readings from Last 3  Encounters:  10/21/19 122/86  10/04/19 (!) 118/56  07/01/19 136/81   BP at goal. Denies SOB, chest pain. Reports dizziness from vertigo has improved last OV. Patient is currently controlled on the following medications:   Telmisartan 80 mg daily   We discussed: home bp monitoring   Plan Continue current medications and control with diet and exercise.    GERD   Patient denies any GERD associated symptoms including dysphagia, heartburn or nausea over the past month.  Currently taking:  Esomeprazole 40 mg daily.   Plan   Continue current medications.  Depression/Anxiety   PHQ 2/9: 0. Taper previously discussed. Patient wishes to stay on medication therapy at this time. Denies any side effects. Patient is currently controlled on:  Citalopram 20 mg daily  Plan   Continue current medication.  Vaccines   Immunization History  Administered Date(s) Administered  . Influenza Split 03/29/2012  . Influenza Whole 03/30/2009, 02/24/2011  . Influenza, High Dose Seasonal PF 02/19/2019  . Influenza,inj,Quad PF,6+ Mos 03/21/2013, 03/17/2014, 03/20/2016, 02/16/2017, 01/14/2018  . Pneumococcal Conjugate-13 07/19/2014  . Pneumococcal Polysaccharide-23 01/07/2016  . Td 03/04/2007, 09/22/2018  . Zoster 03/29/2012   Shingrix recommendation reviewed.  Plan  Recommended patient receive Shingrix vaccine in pharmacy.   Medication Management   Receives prescription medications from:  [facility], [place] [place] [address] Phone: [phone] Fax: [phone]   Expresses interest in coordination of medication refills, home delivery, packaging. Pt unable to confirm medication fill dates at this time.  Plan  Utilize [facility] for medication synchronization, packaging and delivery.  Follow up: Call patient within 1 week to confirm medication fill dates.  6 month telephone visit.   HLD, HTN, weight  loss.  Visit Information  Mr. Harvey was given information about Chronic Care Management services today including:  1. CCM service includes personalized support from designated clinical staff supervised by his physician, including individualized plan of care and coordination with other care providers 2. 24/7  contact phone numbers for assistance for urgent and routine care needs. 3. Standard insurance, coinsurance, copays and deductibles apply for chronic care management only during months in which we provide at least 20 minutes of these services. Most insurances cover these services at 100%, however patients may be responsible for any copay, coinsurance and/or deductible if applicable. This service may help you avoid the need for more expensive face-to-face services. 4. Only one practitioner may furnish and bill the service in a calendar month. 5. The patient may stop CCM services at any time (effective at the end of the month) by phone call to the office staff.  Patient agreed to services and verbal consent obtained.   Verbal consent obtained for [facility] enhanced pharmacy services (medication synchronization, adherence packaging, delivery coordination). A medication sync plan was created to allow patient to get all medications delivered once every 30 to 90 days per patient preference. Patient understands they have freedom to choose pharmacy and clinical pharmacist will coordinate care between all prescribers and [facility].  Madelin Rear, Pharm.D. Clinical Pharmacist [facility] at [facility] [phone]

## 2019-12-02 ENCOUNTER — Ambulatory Visit: Payer: PPO

## 2019-12-02 ENCOUNTER — Other Ambulatory Visit: Payer: Self-pay

## 2019-12-02 DIAGNOSIS — E785 Hyperlipidemia, unspecified: Secondary | ICD-10-CM

## 2019-12-02 DIAGNOSIS — I10 Essential (primary) hypertension: Secondary | ICD-10-CM

## 2019-12-02 NOTE — Patient Instructions (Addendum)
Please call me at [phone] (direct line) with any questions - thank you!  - Edyth Gunnels., Clinical Pharmacist  Goals Addressed            This Visit's Progress   . PharmD Care Plan       CARE PLAN ENTRY  Current Barriers:  . Chronic Disease Management support, education, and care coordination needs related to HTN and HLD  Pharmacist Clinical Goal(s):  Marland Kitchen Maintain BP <140/90  and LDL < 70 over next 6 months . Previous goal of losing 5 lbs by 12/01/2019 o Down 7 lbs to 248 lbs 12/02/2019.  . 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 Comprehensive medication review performed. . Diet/weight loss plan recommended. . Coordination medication management services  Patient Self Care Activities:  . Patient verbalizes understanding of plan to call with any questions.  Initial goal documentation.     Madelin Rear, Pharm.D., BCGP Clinical Pharmacist [facility] [phone]  Hypertension, Adult High blood pressure (hypertension) is when the force of blood pumping through the arteries is too strong. The arteries are the blood vessels that carry blood from the heart throughout the body. Hypertension forces the heart to work harder to pump blood and may cause arteries to become narrow or stiff. Untreated or uncontrolled hypertension can cause a heart attack, heart failure, a stroke, kidney disease, and other problems. A blood pressure reading consists of a higher number over a lower number. Ideally, your blood pressure should be below 120/80. The first ("top") number is called the systolic pressure. It is a measure of the pressure in your arteries as your heart beats. The second ("bottom") number is called the diastolic pressure. It is a measure of the pressure in your arteries as the heart relaxes. What are the causes? The exact cause of this condition is not known. There are some conditions that result in or are related  to high blood pressure. What increases the risk? Some risk factors for high blood pressure are under your control. The following factors may make you more likely to develop this condition:  Smoking.  Having type 2 diabetes mellitus, high cholesterol, or both.  Not getting enough exercise or physical activity.  Being overweight.  Having too much fat, sugar, calories, or salt (sodium) in your diet.  Drinking too much alcohol. Some risk factors for high blood pressure may be difficult or impossible to change. Some of these factors include:  Having chronic kidney disease.  Having a family history of high blood pressure.  Age. Risk increases with age.  Race. You may be at higher risk if you are African American.  Gender. Men are at higher risk than women before age 3. After age 35, women are at higher risk than men.  Having obstructive sleep apnea.  Stress. What are the signs or symptoms? High blood pressure may not cause symptoms. Very high blood pressure (hypertensive crisis) may cause:  Headache.  Anxiety.  Shortness of breath.  Nosebleed.  Nausea and vomiting.  Vision changes.  Severe chest pain.  Seizures. How is this diagnosed? This condition is diagnosed by measuring your blood pressure while you are seated, with your arm resting on a flat surface, your legs uncrossed, and your feet flat on the floor. The cuff of the blood pressure monitor will be placed directly against the skin of your upper arm at the level of your heart. It should be measured at least twice using  the same arm. Certain conditions can cause a difference in blood pressure between your right and left arms. Certain factors can cause blood pressure readings to be lower or higher than normal for a short period of time:  When your blood pressure is higher when you are in a health care provider's office than when you are at home, this is called white coat hypertension. Most people with this condition  do not need medicines.  When your blood pressure is higher at home than when you are in a health care provider's office, this is called masked hypertension. Most people with this condition may need medicines to control blood pressure. If you have a high blood pressure reading during one visit or you have normal blood pressure with other risk factors, you may be asked to:  Return on a different day to have your blood pressure checked again.  Monitor your blood pressure at home for 1 week or longer. If you are diagnosed with hypertension, you may have other blood or imaging tests to help your health care provider understand your overall risk for other conditions. How is this treated? This condition is treated by making healthy lifestyle changes, such as eating healthy foods, exercising more, and reducing your alcohol intake. Your health care provider may prescribe medicine if lifestyle changes are not enough to get your blood pressure under control, and if:  Your systolic blood pressure is above 130.  Your diastolic blood pressure is above 80. Your personal target blood pressure may vary depending on your medical conditions, your age, and other factors. Follow these instructions at home: Eating and drinking   Eat a diet that is high in fiber and potassium, and low in sodium, added sugar, and fat. An example eating plan is called the DASH (Dietary Approaches to Stop Hypertension) diet. To eat this way: ? Eat plenty of fresh fruits and vegetables. Try to fill one half of your plate at each meal with fruits and vegetables. ? Eat whole grains, such as whole-wheat pasta, brown rice, or whole-grain bread. Fill about one fourth of your plate with whole grains. ? Eat or drink low-fat dairy products, such as skim milk or low-fat yogurt. ? Avoid fatty cuts of meat, processed or cured meats, and poultry with skin. Fill about one fourth of your plate with lean proteins, such as fish, chicken without skin,  beans, eggs, or tofu. ? Avoid pre-made and processed foods. These tend to be higher in sodium, added sugar, and fat.  Reduce your daily sodium intake. Most people with hypertension should eat less than 1,500 mg of sodium a day.  Do not drink alcohol if: ? Your health care provider tells you not to drink. ? You are pregnant, may be pregnant, or are planning to become pregnant.  If you drink alcohol: ? Limit how much you use to:  0-1 drink a day for women.  0-2 drinks a day for men. ? Be aware of how much alcohol is in your drink. In the [place] one drink equals one 12 oz bottle of beer (355 mL), one 5 oz glass of wine (148 mL), or one 1 oz glass of hard liquor (44 mL). Lifestyle   Work with your health care provider to maintain a healthy body weight or to lose weight. Ask what an ideal weight is for you.  Get at least 30 minutes of exercise most days of the week. Activities may include walking, swimming, or biking.  Include exercise to strengthen your muscles (  resistance exercise), such as Pilates or lifting weights, as part of your weekly exercise routine. Try to do these types of exercises for 30 minutes at least 3 days a week.  Do not use any products that contain nicotine or tobacco, such as cigarettes, e-cigarettes, and chewing tobacco. If you need help quitting, ask your health care provider.  Monitor your blood pressure at home as told by your health care provider.  Keep all follow-up visits as told by your health care provider. This is important. Medicines  Take over-the-counter and prescription medicines only as told by your health care provider. Follow directions carefully. Blood pressure medicines must be taken as prescribed.  Do not skip doses of blood pressure medicine. Doing this puts you at risk for problems and can make the medicine less effective.  Ask your health care provider about side effects or reactions to medicines that you should watch for. Contact a health  care provider if you:  Think you are having a reaction to a medicine you are taking.  Have headaches that keep coming back (recurring).  Feel dizzy.  Have swelling in your ankles.  Have trouble with your vision. Get help right away if you:  Develop a severe headache or confusion.  Have unusual weakness or numbness.  Feel faint.  Have severe pain in your chest or abdomen.  Vomit repeatedly.  Have trouble breathing. Summary  Hypertension is when the force of blood pumping through your arteries is too strong. If this condition is not controlled, it may put you at risk for serious complications.  Your personal target blood pressure may vary depending on your medical conditions, your age, and other factors. For most people, a normal blood pressure is less than 120/80.  Hypertension is treated with lifestyle changes, medicines, or a combination of both. Lifestyle changes include losing weight, eating a healthy, low-sodium diet, exercising more, and limiting alcohol. This information is not intended to replace advice given to you by your health care provider. Make sure you discuss any questions you have with your health care provider. Document Revised: 01/20/2018 Document Reviewed: 01/20/2018 Elsevier Patient Education  2020 [employer].

## 2019-12-05 ENCOUNTER — Ambulatory Visit: Payer: Self-pay

## 2019-12-05 ENCOUNTER — Telehealth: Payer: Self-pay

## 2019-12-05 MED ORDER — TRAZODONE HCL 50 MG PO TABS: ORAL_TABLET | ORAL | 0 refills | Status: DC

## 2019-12-05 MED ORDER — TELMISARTAN 80 MG PO TABS: 80.0000 mg | ORAL_TABLET | Freq: Every day | ORAL | 1 refills | Status: DC

## 2019-12-05 MED ORDER — ADVAIR DISKUS 250-50 MCG/DOSE IN AEPB: 1.0000 | INHALATION_SPRAY | Freq: Two times a day (BID) | RESPIRATORY_TRACT | 1 refills | Status: DC

## 2019-12-05 MED ORDER — CITALOPRAM HYDROBROMIDE 20 MG PO TABS: 20.0000 mg | ORAL_TABLET | Freq: Every day | ORAL | 1 refills | Status: DC

## 2019-12-05 MED ORDER — SIMVASTATIN 10 MG PO TABS: 10.0000 mg | ORAL_TABLET | Freq: Every day | ORAL | 1 refills | Status: DC

## 2019-12-05 NOTE — Addendum Note (Signed)
Addended by: Davis Gourd on: 12/05/2019 04:26 PM   Modules accepted: Orders

## 2019-12-05 NOTE — Progress Notes (Signed)
Coordination of medication refills, home delivery, pill packaging and medication synchronization.   [facility] - [place], [place] - [address] [place] [address] Phone: [phone] Fax: [phone]  Current Outpatient Medications:  .  ADVAIR DISKUS 250-50 MCG/DOSE AEPB, INHALE 1 DOSE BY MOUTH TWICE DAILY, Disp: 60 each, Rfl: 0 .  citalopram (CELEXA) 20 MG tablet, Take 1 tablet by mouth once daily, Disp: 90 tablet, Rfl: 0 .  simvastatin (ZOCOR) 10 MG tablet, Take 1 tablet by mouth once daily, Disp: 90 tablet, Rfl: 0 .  telmisartan (MICARDIS) 80 MG tablet, Take 1 tablet by mouth once daily, Disp: 90 tablet, Rfl: 0 .  traZODone (DESYREL) 50 MG tablet, TAKE 1/2 TO 1 (ONE-HALF TO ONE) TABLET BY MOUTH ONCE DAILY AT BEDTIME AS NEEDED FOR SLEEP, Disp: 30 tablet, Rfl: 0  Verbal consent obtained for [facility] enhanced pharmacy services (medication synchronization, adherence packaging, delivery coordination). A medication sync plan was created to allow patient to get all medications delivered once every 30 to 90 days per patient preference. Patient understands they have freedom to choose pharmacy and clinical pharmacist will coordinate care between all prescribers and [facility].  Madelin Rear, Pharm.D., BCGP Clinical Pharmacist [id number] [phone]

## 2019-12-05 NOTE — Telephone Encounter (Signed)
Medication filled to pharmacy as requested.   

## 2019-12-05 NOTE — Telephone Encounter (Signed)
Taylor Harvey can you please send these prescriptions to [facility]? Thank you for the help.   Current Outpatient Medications:  .  citalopram (CELEXA) 20 MG tablet, Take 1 tablet by mouth once daily, Disp: 90 tablet, Rfl: 0 .  simvastatin (ZOCOR) 10 MG tablet, Take 1 tablet by mouth once daily, Disp: 90 tablet, Rfl: 0 .  telmisartan (MICARDIS) 80 MG tablet, Take 1 tablet by mouth once daily, Disp: 90 tablet, Rfl: 0 .  traZODone (DESYREL) 50 MG tablet, TAKE 1/2 TO 1 (ONE-HALF TO ONE) TABLET BY MOUTH ONCE DAILY AT BEDTIME AS NEEDED FOR SLEEP, Disp: 30 tablet, Rfl: 0 .  ADVAIR DISKUS 250-50 MCG/DOSE AEPB, INHALE 1 DOSE BY MOUTH TWICE DAILY, Disp: 60 each, Rfl: 0

## 2019-12-15 DIAGNOSIS — G4733 Obstructive sleep apnea (adult) (pediatric): Secondary | ICD-10-CM | POA: Diagnosis not present

## 2019-12-16 ENCOUNTER — Telehealth: Payer: Self-pay

## 2019-12-16 MED ORDER — ESOMEPRAZOLE MAGNESIUM 40 MG PO CPDR: 40.0000 mg | DELAYED_RELEASE_CAPSULE | Freq: Every day | ORAL | 1 refills | Status: DC

## 2019-12-16 NOTE — Telephone Encounter (Cosign Needed)
Patient is wanting to know if you could send in a 90 ds of esomeprazole 40 mg daily to [facility]. Please let me know and I can follow-up with patient.   Thank you Edison Nasuti

## 2019-12-16 NOTE — Telephone Encounter (Signed)
Medication filled to pharmacy as requested.   

## 2019-12-16 NOTE — Telephone Encounter (Signed)
Ok to fill? Not listed in your name.

## 2019-12-16 NOTE — Telephone Encounter (Signed)
Ok to fill in my name

## 2020-01-15 DIAGNOSIS — G4733 Obstructive sleep apnea (adult) (pediatric): Secondary | ICD-10-CM | POA: Diagnosis not present

## 2020-02-15 DIAGNOSIS — G4733 Obstructive sleep apnea (adult) (pediatric): Secondary | ICD-10-CM | POA: Diagnosis not present

## 2020-03-10 ENCOUNTER — Other Ambulatory Visit: Payer: Self-pay | Admitting: Family Medicine

## 2020-03-16 DIAGNOSIS — G4733 Obstructive sleep apnea (adult) (pediatric): Secondary | ICD-10-CM | POA: Diagnosis not present

## 2020-03-21 ENCOUNTER — Other Ambulatory Visit: Payer: Self-pay | Admitting: Family Medicine

## 2020-04-13 NOTE — Progress Notes (Deleted)
Subjective:   Taylor Harvey. is a 69 y.o. male who presents for Medicare Annual/Subsequent preventive examination.  I connected with Kyung Rudd today by telephone and verified that I am speaking with the correct person using two identifiers. Location patient: home Location provider: work Persons participating in the virtual visit: patient, Marine scientist.    I discussed the limitations, risks, security and privacy concerns of performing an evaluation and management service by telephone and the availability of in person appointments. I also discussed with the patient that there may be a patient responsible charge related to this service. The patient expressed understanding and verbally consented to this telephonic visit.    Interactive audio and video telecommunications were attempted between this provider and patient, however failed, due to patient having technical difficulties OR patient did not have access to video capability.  We continued and completed visit with audio only.  Some vital signs may be absent or patient reported.   Time Spent with patient on telephone encounter: *** minutes   Review of Systems    ***       Objective:    There were no vitals filed for this visit. There is no height or weight on file to calculate BMI.  Advanced Directives 07/23/2017 08/13/2016 07/30/2016 05/04/2012  Does Patient Have a Medical Advance Directive? No Yes No Patient does not have advance directive  Would patient like information on creating a medical advance directive? No - Patient declined - - -    Current Medications (verified) Outpatient Encounter Medications as of 04/16/2020  Medication Sig   ADVAIR DISKUS 250-50 MCG/DOSE AEPB INHALE 1 PUFF INTO LUNGS TWICE DAILY   aspirin 81 MG tablet Take 81 mg by mouth daily.     benzonatate (TESSALON) 100 MG capsule Take 1 capsule (100 mg total) by mouth 3 (three) times daily as needed for cough. (Patient not taking: Reported on 12/02/2019)    citalopram (CELEXA) 20 MG tablet Take 1 tablet (20 mg total) by mouth daily.   doxycycline (VIBRA-TABS) 100 MG tablet Take 1 tablet (100 mg total) by mouth 2 (two) times daily. (Patient not taking: Reported on 12/02/2019)   esomeprazole (NEXIUM) 40 MG capsule Take 1 capsule (40 mg total) by mouth daily.   HYDROcodone-acetaminophen (NORCO/VICODIN) 5-325 MG tablet Take 1 tablet by mouth every 6 (six) hours as needed for moderate pain.   meclizine (ANTIVERT) 25 MG tablet TAKE 1 TABLET BY MOUTH THREE TIMES DAILY AS NEEDED FOR DIZZINESS   meloxicam (MOBIC) 15 MG tablet Take 1 tablet (15 mg total) by mouth daily. (Patient not taking: Reported on 12/02/2019)   ondansetron (ZOFRAN) 4 MG tablet TAKE 1 TABLET BY MOUTH EVERY 8 HOURS AS NEEDED FOR NAUSEA FOR VOMITING   simvastatin (ZOCOR) 10 MG tablet Take 1 tablet (10 mg total) by mouth daily.   telmisartan (MICARDIS) 80 MG tablet Take 1 tablet by mouth once daily   traZODone (DESYREL) 50 MG tablet TAKE 1/2 TO 1 (ONE-HALF TO ONE) TABLET BY MOUTH ONCE DAILY AT BEDTIME AS NEEDED FOR SLEEP   No facility-administered encounter medications on file as of 04/16/2020.    Allergies (verified) Patient has no known allergies.   History: Past Medical History:  Diagnosis Date   Allergy    Anxiety    Arthritis    Chronic kidney disease    hx kidney stones   Depression    GERD (gastroesophageal reflux disease)    Hyperlipemia    Hypertension    Sleep apnea 2005  moderate-has cpap-not used in 3 yr   Past Surgical History:  Procedure Laterality Date   COLONOSCOPY     INSERTION OF MESH  05/05/2012   Procedure: INSERTION OF MESH;  Surgeon: Rolm Bookbinder, MD;  Location: [facility];  Service: General;  Laterality: N/A;   LITHOTRIPSY     x2   SHOULDER ARTHROSCOPY  07/2017   trigger finger surgery  2993   UMBILICAL HERNIA REPAIR  05/05/2012   Procedure: HERNIA REPAIR UMBILICAL ADULT;  Surgeon: Rolm Bookbinder, MD;   Location: [facility];  Service: General;  Laterality: N/A;   Family History  Problem Relation Age of Onset   Diabetes Mother    Pancreatitis Mother    Heart disease Mother 66       MI, died with MI   Stroke Father    Hypertension Father    Peripheral vascular disease Father        Carotid surgery   Heart disease Sister        Arrhythmia   Diabetes Sister 17       DM   Kidney cancer Sister    Diabetes Sister    Cancer Sister        colon   Heart disease Sister    Diabetes Sister    Heart disease Sister    Stroke Brother    Colon cancer Sister    Pancreatic cancer Other    Social History   Socioeconomic History   Marital status: Married    Spouse name: Not on file   Number of children: 6   Years of education: Not on file   Highest education level: Not on file  Occupational History   Occupation: retired  Tobacco Use   Smoking status: Never Smoker   Smokeless tobacco: Never Used  Scientific laboratory technician Use: Never used  Substance and Sexual Activity   Alcohol use: No   Drug use: No   Sexual activity: Yes    Partners: Female  Other Topics Concern   Not on file  Social History Narrative   Exercise--walks a mile 5 days a week.  One deceased child age 70.  One stepchild, 5 adopted.    Social Determinants of Health   Financial Resource Strain:    Difficulty of Paying Living Expenses: Not on file  Food Insecurity: No Food Insecurity   Worried About Charity fundraiser in the Last Year: Never true   Ran Out of Food in the Last Year: Never true  Transportation Needs: No Transportation Needs   Lack of Transportation (Medical): No   Lack of Transportation (Non-Medical): No  Physical Activity:    Days of Exercise per Week: Not on file   Minutes of Exercise per Session: Not on file  Stress:    Feeling of Stress : Not on file  Social Connections:    Frequency of Communication with Friends and Family: Not on file    Frequency of Social Gatherings with Friends and Family: Not on file   Attends Religious Services: Not on file   Active Member of Clubs or Organizations: Not on file   Attends Archivist Meetings: Not on file   Marital Status: Not on file    Tobacco Counseling Counseling given: Not Answered   Clinical Intake:                 Diabetic?No         Activities of Daily Living In your present  state of health, do you have any difficulty performing the following activities: 10/21/2019 07/01/2019  Hearing? N N  Vision? N N  Difficulty concentrating or making decisions? N N  Walking or climbing stairs? N N  Dressing or bathing? N N  Doing errands, shopping? N N  Preparing Food and eating ? - -  Using the Toilet? - -  In the past six months, have you accidently leaked urine? - -  Do you have problems with loss of bowel control? - -  Managing your Medications? - -  Managing your Finances? - -  Housekeeping or managing your Housekeeping? - -  Some recent data might be hidden    Patient Care Team: Midge Minium, MD as PCP - General (Family Medicine) Loletha Carrow, Kirke Corin, MD as Consulting Physician (Gastroenterology) Mcarthur Rossetti, MD as Consulting Physician (Orthopedic Surgery) Madelin Rear, [facility] as Pharmacist (Pharmacist)  Indicate any recent Medical Services you may have received from other than [facility] providers in the past year (date may be approximate).     Assessment:   This is a routine wellness examination for [place].  Hearing/Vision screen No exam data present  Dietary issues and exercise activities discussed:    Goals     PharmD Care Plan     CARE PLAN ENTRY  Current Barriers:   Chronic Disease Management support, education, and care coordination needs related to HTN and HLD  Pharmacist Clinical Goal(s):   Maintain BP <140/90  and LDL < 70 over next 6 months  Previous goal of losing 5 lbs by 12/01/2019 o Down 7 lbs to 248  lbs 12/02/2019.   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  Diet/weight loss plan recommended.  Coordination medication management services.  Patient Self Care Activities:   Patient verbalizes understanding of plan to call with any questions.  Initial goal documentation.     Weight (lb) < 230 lb (104.3 kg)     Lose weight by increasing activity and watching diet.       Depression Screen PHQ 2/9 Scores 12/02/2019 11/11/2019 10/21/2019 09/01/2019 07/01/2019 04/14/2019 09/22/2018  PHQ - 2 Score 0 0 0 0 0 0 0  PHQ- 9 Score - - 0 - - 0 0    Fall Risk Fall Risk  11/11/2019 10/21/2019 09/01/2019 07/01/2019 04/14/2019  Falls in the past year? 1 1 0 0 1  Number falls in past yr: 0 1 0 0 0  Injury with Fall? 0 0 0 0 0  Risk for fall due to : - - - - Impaired balance/gait  Follow up Falls evaluation completed - Falls evaluation completed Falls evaluation completed Falls evaluation completed    Any stairs in or around the home? {YES/NO:21197} If so, are there any without handrails? {YES/NO:21197} Home free of loose throw rugs in walkways, pet beds, electrical cords, etc? {YES/NO:21197} Adequate lighting in your home to reduce risk of falls? {YES/NO:21197}  ASSISTIVE DEVICES UTILIZED TO PREVENT FALLS:  Life alert? {YES/NO:21197} Use of a cane, walker or w/c? {YES/NO:21197} Grab bars in the bathroom? {YES/NO:21197} Shower chair or bench in shower? {YES/NO:21197} Elevated toilet seat or a handicapped toilet? {YES/NO:21197}  TIMED UP AND GO:  Was the test performed? {YES/NO:21197}.  Length of time to ambulate 10 feet: *** sec.   {Appearance of EXHB:7169678}  Cognitive Function: MMSE - Mini Mental State Exam 07/23/2017  Orientation to time 5  Orientation to Place 5  Registration 3  Attention/ Calculation 5  Recall 3  Language- name 2 objects 2  Language- repeat 1  Language- follow 3 step command 3   Language- read & follow direction 1  Write a sentence 1  Copy design 1  Total score 30        Immunizations Immunization History  Administered Date(s) Administered   Influenza Split 03/29/2012   Influenza Whole 03/30/2009, 02/24/2011   Influenza, High Dose Seasonal PF 02/19/2019   Influenza,inj,Quad PF,6+ Mos 03/21/2013, 03/17/2014, 03/20/2016, 02/16/2017, 01/14/2018   Pneumococcal Conjugate-13 07/19/2014   Pneumococcal Polysaccharide-23 01/07/2016   Td 03/04/2007, 09/22/2018   Zoster 03/29/2012    TDAP status: Up to date   {Flu Vaccine status:2101806}   Pneumococcal vaccine status: Up to date   {Covid-19 vaccine status:2101808}  Qualifies for Shingles Vaccine? Yes   Zostavax completed Yes   Shingrix Completed?: No.    Education has been provided regarding the importance of this vaccine. Patient has been advised to call insurance company to determine out of pocket expense if they have not yet received this vaccine. Advised may also receive vaccine at local pharmacy or Health Dept. Verbalized acceptance and understanding.  Screening Tests Health Maintenance  Topic Date Due   COVID-19 Vaccine (1) Never done   INFLUENZA VACCINE  12/25/2019   COLONOSCOPY  08/13/2021   TETANUS/TDAP  09/21/2028   Hepatitis C Screening  Completed   PNA vac Low Risk Adult  Completed    Health Maintenance  Health Maintenance Due  Topic Date Due   COVID-19 Vaccine (1) Never done   INFLUENZA VACCINE  12/25/2019    Colorectal cancer screening: Completed Colonoscopy 08/13/2016. Repeat every 5 years  Lung Cancer Screening: (Low Dose CT Chest recommended if Age 55-80 years, 30 pack-year currently smoking OR have quit w/in 15years.) does not qualify.     Additional Screening:  Hepatitis C Screening:  Completed 07/21/2016  Vision Screening: Recommended annual ophthalmology exams for early detection of glaucoma and other disorders of the eye. Is the patient up to date with  their annual eye exam?  {YES/NO:21197} Who is the provider or what is the name of the office in which the patient attends annual eye exams? *** If pt is not established with a provider, would they like to be referred to a provider to establish care? {YES/NO:21197}.   Dental Screening: Recommended annual dental exams for proper oral hygiene  Community Resource Referral / Chronic Care Management: CRR required this visit?  {YES/NO:21197}  CCM required this visit?  {YES/NO:21197}     Plan:     I have personally reviewed and noted the following in the patients chart:    Medical and social history  Use of alcohol, tobacco or illicit drugs   Current medications and supplements  Functional ability and status  Nutritional status  Physical activity  Advanced directives  List of other physicians  Hospitalizations, surgeries, and ER visits in previous 12 months  Vitals  Screenings to include cognitive, depression, and falls  Referrals and appointments  In addition, I have reviewed and discussed with patient certain preventive protocols, quality metrics, and best practice recommendations. A written personalized care plan for preventive services as well as general preventive health recommendations were provided to patient.   Due to this being a telephonic visit, the after visit summary with patients personalized plan was offered to patient via mail or my-chart.  Patient would like to access on my-chart.   Marta Antu, LPN   03/19/8526  Nurse Health Advisor  Nurse Notes: ***

## 2020-04-16 ENCOUNTER — Ambulatory Visit: Payer: PPO

## 2020-04-16 ENCOUNTER — Other Ambulatory Visit: Payer: Self-pay | Admitting: Family Medicine

## 2020-04-16 DIAGNOSIS — G4733 Obstructive sleep apnea (adult) (pediatric): Secondary | ICD-10-CM | POA: Diagnosis not present

## 2020-04-17 ENCOUNTER — Telehealth: Payer: Self-pay

## 2020-04-17 NOTE — Progress Notes (Signed)
Chronic Care Management Pharmacy Assistant   Name: Taylor Harvey.  MRN: [id number] DOB: 07-23-1950  Reason for Encounter: Medication Review  PCP : Midge Minium, MD  Allergies:  No Known Allergies  Medications: Outpatient Encounter Medications as of 04/17/2020  Medication Sig  . ADVAIR DISKUS 250-50 MCG/DOSE AEPB INHALE 1 PUFF INTO LUNGS TWICE DAILY  . aspirin 81 MG tablet Take 81 mg by mouth daily.    . benzonatate (TESSALON) 100 MG capsule Take 1 capsule (100 mg total) by mouth 3 (three) times daily as needed for cough. (Patient not taking: Reported on 12/02/2019)  . citalopram (CELEXA) 20 MG tablet Take 1 tablet (20 mg total) by mouth daily.  Marland Kitchen doxycycline (VIBRA-TABS) 100 MG tablet Take 1 tablet (100 mg total) by mouth 2 (two) times daily. (Patient not taking: Reported on 12/02/2019)  . esomeprazole (NEXIUM) 40 MG capsule Take 1 capsule (40 mg total) by mouth daily.  Marland Kitchen HYDROcodone-acetaminophen (NORCO/VICODIN) 5-325 MG tablet Take 1 tablet by mouth every 6 (six) hours as needed for moderate pain.  . meclizine (ANTIVERT) 25 MG tablet TAKE 1 TABLET BY MOUTH THREE TIMES DAILY AS NEEDED FOR DIZZINESS  . meloxicam (MOBIC) 15 MG tablet Take 1 tablet (15 mg total) by mouth daily. (Patient not taking: Reported on 12/02/2019)  . ondansetron (ZOFRAN) 4 MG tablet TAKE 1 TABLET BY MOUTH EVERY 8 HOURS AS NEEDED FOR NAUSEA FOR VOMITING  . simvastatin (ZOCOR) 10 MG tablet Take 1 tablet (10 mg total) by mouth daily.  Marland Kitchen telmisartan (MICARDIS) 80 MG tablet Take 1 tablet by mouth once daily  . traZODone (DESYREL) 50 MG tablet TAKE 1/2 TO 1 (ONE-HALF TO ONE) TABLET BY MOUTH ONCE DAILY AT BEDTIME AS NEEDED FOR SLEEP   No facility-administered encounter medications on file as of 04/17/2020.    Current Diagnosis: Patient Active Problem List   Diagnosis Date Noted  . GERD (gastroesophageal reflux disease) 04/14/2019  . Status post arthroscopy of left shoulder 10/15/2017  . Lateral  epicondylitis, left elbow 04/08/2017  . Chronic left shoulder pain 02/17/2017  . Pain in left elbow 02/17/2017  . Left upper arm pain 01/20/2017  . Insomnia 07/19/2014  . Urinary frequency 07/19/2014  . Physical exam 07/19/2014  . Morbid obesity ([place]) 06/28/2013  . Hyperlipidemia 11/24/2008  . ANXIETY STATE, UNSPECIFIED 06/02/2008  . NEOPLASM, SKIN, UNCERTAIN BEHAVIOR 78/46/9629  . TRIGGER FINGER 03/02/2008  . BACK PAIN WITH RADICULOPATHY 03/02/2008  . BACK PAIN 07/15/2007  . DYSPNEA ON EXERTION 11/09/2006  . POLYP, COLON 07/19/2006  . Essential hypertension 07/19/2006  . Sleep apnea 07/19/2006  . DIVERTICULOSIS, ASYMPTOMATIC 06/11/2006    Reviewed chart for medication changes ahead of medication coordination call.  No OVs, Consults, or hospital visits since last care coordination call/Pharmacist visit. (If appropriate, list visit date, provider name)  No medication changes indicated OR if recent visit, treatment plan here.  BP Readings from Last 3 Encounters:  10/21/19 122/86  10/04/19 (!) 118/56  07/01/19 136/81    Lab Results  Component Value Date   HGBA1C 6.3 04/14/2019     Patient obtains medications through Adherence Packaging  90 Days   Patient is due for next adherence delivery on: 04-26-20. Called patient and reviewed medications and coordinated delivery.  This delivery to include:   -Esomepra Mag 40mg  Take one capsule every morning -Trazodone 50mg  Tab  Take  a tab at bedtime -Citalopram 20mg  Tab Take one tab every morning -Telmisartan 80mg  Take one tab every morning -Simvastatin  10mg  Tab Take one tab at bedtime  -Advair Disku Aer 250/50 Inhale 1 puff into the lungs in the morning and at bedtime   Patient needs refills for :        -Trazodone 50mg  Tab  Take  a tab at bedtime (CPP to request)        -Simvastatin 10mg  Tab Take one tab at bedtime (CPP to request)   Confirmed delivery date of 04-26-20, advised patient that pharmacy will contact them the  morning of delivery.   Georgiana Shore ,[place] Pharmacist Assistant [phone]   Follow-Up:  Pharmacist Review

## 2020-04-18 ENCOUNTER — Telehealth: Payer: Self-pay

## 2020-04-18 ENCOUNTER — Other Ambulatory Visit: Payer: Self-pay

## 2020-04-18 DIAGNOSIS — E785 Hyperlipidemia, unspecified: Secondary | ICD-10-CM

## 2020-04-18 DIAGNOSIS — F329 Major depressive disorder, single episode, unspecified: Secondary | ICD-10-CM

## 2020-04-18 MED ORDER — SIMVASTATIN 10 MG PO TABS: 10.0000 mg | ORAL_TABLET | Freq: Every day | ORAL | 1 refills | Status: DC

## 2020-04-18 MED ORDER — TRAZODONE HCL 50 MG PO TABS: ORAL_TABLET | ORAL | 0 refills | Status: DC

## 2020-04-18 NOTE — Telephone Encounter (Signed)
Patient requesting the following  Simivastin 10mg   90 tabs 1 refill  Last refill 12/05/2019  Trazodone 50mg   90 tabs 0 refills  Last refill 12/05/2019

## 2020-04-18 NOTE — Telephone Encounter (Signed)
Rx sent to pharmacy   

## 2020-04-27 NOTE — Progress Notes (Signed)
Subjective:   Taylor Harvey. is a 69 y.o. male who presents for Medicare Annual/Subsequent preventive examination.  I connected with Wiliam today by telephone and verified that I am speaking with the correct person using two identifiers. Location patient: home Location provider: work Persons participating in the virtual visit: patient, Marine scientist.    I discussed the limitations, risks, security and privacy concerns of performing an evaluation and management service by telephone and the availability of in person appointments. I also discussed with the patient that there may be a patient responsible charge related to this service. The patient expressed understanding and verbally consented to this telephonic visit.    Interactive audio and video telecommunications were attempted between this provider and patient, however failed, due to patient having technical difficulties OR patient did not have access to video capability.  We continued and completed visit with audio only.  Some vital signs may be absent or patient reported.   Time Spent with patient on telephone encounter: 20 minutes   Review of Systems     Cardiac Risk Factors include: advanced age (>70men, >88 women);male gender;hypertension;dyslipidemia;obesity (BMI >30kg/m2)     Objective:    Today's Vitals   04/30/20 0900  Weight: 250 lb (113.4 kg)  Height: 5\' 8"  (1.727 m)   Body mass index is 38.01 kg/m.  Advanced Directives 04/30/2020 07/23/2017 08/13/2016 07/30/2016 05/04/2012  Does Patient Have a Medical Advance Directive? No No Yes No Patient does not have advance directive  Does patient want to make changes to medical advance directive? No - Patient declined - - - -  Would patient like information on creating a medical advance directive? - No - Patient declined - - -    Current Medications (verified) Outpatient Encounter Medications as of 04/30/2020  Medication Sig  . ADVAIR DISKUS 250-50 MCG/DOSE AEPB INHALE 1 PUFF  INTO LUNGS TWICE DAILY  . aspirin 81 MG tablet Take 81 mg by mouth daily.    . citalopram (CELEXA) 20 MG tablet Take 1 tablet (20 mg total) by mouth daily.  Marland Kitchen esomeprazole (NEXIUM) 40 MG capsule Take 1 capsule (40 mg total) by mouth daily.  . meclizine (ANTIVERT) 25 MG tablet TAKE 1 TABLET BY MOUTH THREE TIMES DAILY AS NEEDED FOR DIZZINESS  . ondansetron (ZOFRAN) 4 MG tablet TAKE 1 TABLET BY MOUTH EVERY 8 HOURS AS NEEDED FOR NAUSEA FOR VOMITING  . simvastatin (ZOCOR) 10 MG tablet Take 1 tablet (10 mg total) by mouth daily.  Marland Kitchen telmisartan (MICARDIS) 80 MG tablet Take 1 tablet by mouth once daily  . traZODone (DESYREL) 50 MG tablet TAKE 1/2 TO 1 (ONE-HALF TO ONE) TABLET BY MOUTH ONCE DAILY AT BEDTIME AS NEEDED FOR SLEEP  . HYDROcodone-acetaminophen (NORCO/VICODIN) 5-325 MG tablet Take 1 tablet by mouth every 6 (six) hours as needed for moderate pain. (Patient not taking: Reported on 04/30/2020)  . meloxicam (MOBIC) 15 MG tablet Take 1 tablet (15 mg total) by mouth daily. (Patient not taking: Reported on 12/02/2019)  . [DISCONTINUED] benzonatate (TESSALON) 100 MG capsule Take 1 capsule (100 mg total) by mouth 3 (three) times daily as needed for cough. (Patient not taking: Reported on 12/02/2019)  . [DISCONTINUED] doxycycline (VIBRA-TABS) 100 MG tablet Take 1 tablet (100 mg total) by mouth 2 (two) times daily. (Patient not taking: Reported on 12/02/2019)   No facility-administered encounter medications on file as of 04/30/2020.    Allergies (verified) Patient has no known allergies.   History: Past Medical History:  Diagnosis Date  .  Allergy   . Anxiety   . Arthritis   . Chronic kidney disease    hx kidney stones  . Depression   . GERD (gastroesophageal reflux disease)   . Hyperlipemia   . Hypertension   . Sleep apnea 2005   moderate-has cpap-not used in 3 yr   Past Surgical History:  Procedure Laterality Date  . COLONOSCOPY    . INSERTION OF MESH  05/05/2012   Procedure: INSERTION OF  MESH;  Surgeon: Rolm Bookbinder, MD;  Location: [facility];  Service: General;  Laterality: N/A;  . LITHOTRIPSY     x2  . SHOULDER ARTHROSCOPY  07/2017  . trigger finger surgery  2006  . UMBILICAL HERNIA REPAIR  05/05/2012   Procedure: HERNIA REPAIR UMBILICAL ADULT;  Surgeon: Rolm Bookbinder, MD;  Location: [place];  Service: General;  Laterality: N/A;   Family History  Problem Relation Age of Onset  . Diabetes Mother   . Pancreatitis Mother   . Heart disease Mother 49       MI, died with MI  . Stroke Father   . Hypertension Father   . Peripheral vascular disease Father        Carotid surgery  . Heart disease Sister        Arrhythmia  . Diabetes Sister 31       DM  . Kidney cancer Sister   . Diabetes Sister   . Cancer Sister        colon  . Heart disease Sister   . Diabetes Sister   . Heart disease Sister   . Stroke Brother   . Colon cancer Sister   . Pancreatic cancer Other    Social History   Socioeconomic History  . Marital status: Married    Spouse name: Not on file  . Number of children: 6  . Years of education: Not on file  . Highest education level: Not on file  Occupational History  . Occupation: retired  Tobacco Use  . Smoking status: Never Smoker  . Smokeless tobacco: Never Used  Vaping Use  . Vaping Use: Never used  Substance and Sexual Activity  . Alcohol use: No  . Drug use: No  . Sexual activity: Yes    Partners: Female  Other Topics Concern  . Not on file  Social History Narrative   Exercise--walks a mile 5 days a week.  One deceased child age 74.  One stepchild, 5 adopted.    Social Determinants of Health   Financial Resource Strain: Low Risk   . Difficulty of Paying Living Expenses: Not hard at all  Food Insecurity: No Food Insecurity  . Worried About Charity fundraiser in the Last Year: Never true  . Ran Out of Food in the Last Year: Never true  Transportation Needs: No Transportation Needs  .  Lack of Transportation (Medical): No  . Lack of Transportation (Non-Medical): No  Physical Activity: Insufficiently Active  . Days of Exercise per Week: 1 day  . Minutes of Exercise per Session: 60 min  Stress: No Stress Concern Present  . Feeling of Stress : Not at all  Social Connections: Socially Integrated  . Frequency of Communication with Friends and Family: More than three times a week  . Frequency of Social Gatherings with Friends and Family: More than three times a week  . Attends Religious Services: More than 4 times per year  . Active Member of Clubs or Organizations: Yes  .  Attends Archivist Meetings: More than 4 times per year  . Marital Status: Married    Tobacco Counseling Counseling given: Not Answered   Clinical Intake:  Pre-visit preparation completed: Yes  Pain : No/denies pain     Nutritional Status: BMI > 30  Obese Nutritional Risks: None Diabetes: No  How often do you need to have someone help you when you read instructions, pamphlets, or other written materials from your doctor or pharmacy?: 1 - Never What is the last grade level you completed in school?: BA degree  Diabetic?No  Interpreter Needed?: No  Information entered by :: Caroleen Hamman LPN   Activities of Daily Living In your present state of health, do you have any difficulty performing the following activities: 04/30/2020 10/21/2019  Hearing? N N  Vision? N N  Difficulty concentrating or making decisions? N N  Walking or climbing stairs? N N  Dressing or bathing? N N  Doing errands, shopping? N N  Preparing Food and eating ? N -  Using the Toilet? N -  In the past six months, have you accidently leaked urine? N -  Do you have problems with loss of bowel control? N -  Managing your Medications? N -  Managing your Finances? N -  Housekeeping or managing your Housekeeping? N -  Some recent data might be hidden    Patient Care Team: Midge Minium, MD as PCP -  General (Family Medicine) Loletha Carrow Kirke Corin, MD as Consulting Physician (Gastroenterology) Mcarthur Rossetti, MD as Consulting Physician (Orthopedic Surgery) Madelin Rear, [facility] as Pharmacist (Pharmacist)  Indicate any recent Medical Services you may have received from other than [facility] providers in the past year (date may be approximate).     Assessment:   This is a routine wellness examination for [place].  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 PharmD Care Plan     CARE PLAN ENTRY  Current Barriers:  . Chronic Disease Management support, education, and care coordination needs related to HTN and HLD  Pharmacist Clinical Goal(s):  Marland Kitchen Maintain BP <140/90  and LDL < 70 over next 6 months . Previous goal of losing 5 lbs by 12/01/2019 o Down 7 lbs to 248 lbs 12/02/2019.  . 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 Comprehensive medication review performed. . Diet/weight loss plan recommended. . Coordination medication management services.  Patient Self Care Activities:  . Patient verbalizes understanding of plan to call with any questions.  Initial goal documentation.    . Weight (lb) < 230 lb (104.3 kg)     Lose weight (20-25pounds)by increasing activity and watching diet.       Depression Screen PHQ 2/9 Scores 04/30/2020 12/02/2019 11/11/2019 10/21/2019 09/01/2019 07/01/2019 04/14/2019  PHQ - 2 Score 0 0 0 0 0 0 0  PHQ- 9 Score - - - 0 -  - 0    Fall Risk Fall Risk  04/30/2020 11/11/2019 10/21/2019 09/01/2019 07/01/2019  Falls in the past year? 1 1 1  0 0  Number falls in past yr: 1 0 1 0 0  Injury with Fall? 0 0 0 0 0  Risk for fall due to : History of fall(s) - - - -  Follow up Falls prevention discussed Falls evaluation completed - Falls evaluation completed Falls evaluation completed    Any stairs in or around the home? No  Home free of loose throw rugs in walkways, pet beds, electrical cords, etc? Yes  Adequate lighting in your home to reduce risk of falls? Yes   ASSISTIVE DEVICES UTILIZED TO PREVENT FALLS:  Life alert? No  Use of a cane, walker or w/c? No  Grab bars in the bathroom? No  Shower chair or bench in shower? No  Elevated toilet seat or a handicapped toilet? No   TIMED UP AND GO:  Was the test performed? No . Phone visit   Cognitive Function:No cognitive impairment noted MMSE - Mini Mental State Exam 07/23/2017  Orientation to time 5  Orientation to Place 5  Registration 3  Attention/ Calculation 5  Recall 3  Language- name 2 objects 2  Language- repeat 1  Language- follow 3 step command 3  Language- read & follow direction 1  Write a sentence 1  Copy design 1  Total score 30        Immunizations Immunization History  Administered Date(s) Administered  . Influenza Split 03/29/2012  . Influenza Whole 03/30/2009, 02/24/2011  . Influenza, High Dose Seasonal PF 02/19/2019  . Influenza,inj,Quad PF,6+ Mos 03/21/2013, 03/17/2014, 03/20/2016, 02/16/2017, 01/14/2018  . Pneumococcal Conjugate-13 07/19/2014  . Pneumococcal Polysaccharide-23 01/07/2016  . Td 03/04/2007, 09/22/2018  . Zoster 03/29/2012    TDAP status: Up to date   Flu vaccine status: Due- Patient plans to get the vaccine soon  Pneumococcal vaccine status: Up to date   Covid-19 vaccine status: Declined, Education has been provided regarding the importance of this vaccine but patient still declined. Advised may receive this  vaccine at local pharmacy or Health Dept.or vaccine clinic. Aware to provide a copy of the vaccination record if obtained from local pharmacy or Health Dept. Verbalized acceptance and understanding.  Qualifies for Shingles Vaccine? Yes   Zostavax completed Yes   Shingrix Completed?: No.    Education has been provided regarding the importance of this vaccine. Patient has been advised to call insurance company to determine out of pocket expense if they have not yet received this vaccine. Advised may also receive vaccine at local pharmacy or Health Dept. Verbalized acceptance and understanding.  Screening Tests Health Maintenance  Topic Date Due  . COVID-19 Vaccine (1) Never done  . INFLUENZA VACCINE  12/25/2019  . COLONOSCOPY  08/13/2021  . TETANUS/TDAP  09/21/2028  . Hepatitis C Screening  Completed  . PNA vac Low Risk Adult  Completed    Health Maintenance  Health Maintenance Due  Topic Date Due  . COVID-19 Vaccine (1) Never done  . INFLUENZA VACCINE  12/25/2019    Colorectal cancer screening: Completed Colonoscopy 08/13/2016. Repeat every 5 years  Lung Cancer Screening: (Low Dose CT Chest recommended if Age 35-80 years, 30 pack-year currently smoking OR have quit w/in 15years.) does not qualify.    Additional Screening:  Hepatitis C Screening:Completed 07/21/2016  Vision Screening: Recommended annual ophthalmology exams for early detection of glaucoma and other disorders of the eye. Is the patient up to date with their annual eye exam?  Yes  Who is the provider or what is the name of the office in which the patient attends annual eye exams? [facility]   Dental Screening: Recommended annual dental exams for proper oral hygiene  Community Resource Referral / Chronic Care Management: CRR required this visit?  No   CCM required this visit?  No      Plan:     I have personally reviewed and noted the following in the patient's chart:   . Medical and social history  . Use of alcohol, tobacco or illicit drugs  . Current medications and supplements . Functional ability and status . Nutritional status . Physical activity . Advanced directives . List of other physicians . Hospitalizations, surgeries, and ER visits in previous 12 months . Vitals . Screenings to include cognitive, depression, and falls . Referrals and appointments  In addition, I have reviewed and discussed with patient certain preventive protocols, quality metrics, and best practice recommendations. A written personalized care plan for preventive services as well as general preventive health recommendations were provided to patient.   Due to this being a telephonic visit, the after visit summary with patients personalized plan was offered to patient via mail or my-chart.  Patient would like to access on my-chart.   Marta Antu, LPN   [id number]  Nurse Health Advisor  Nurse Notes: None

## 2020-04-30 ENCOUNTER — Ambulatory Visit (INDEPENDENT_AMBULATORY_CARE_PROVIDER_SITE_OTHER): Payer: PPO

## 2020-04-30 VITALS — Ht 68.0 in | Wt 250.0 lb

## 2020-04-30 DIAGNOSIS — Z0000 Encounter for general adult medical examination without abnormal findings: Secondary | ICD-10-CM | POA: Diagnosis not present

## 2020-04-30 NOTE — Patient Instructions (Signed)
Taylor Harvey , Thank you for taking time to complete your Medicare Wellness Visit. I appreciate your ongoing commitment to your health goals. Please review the following plan we discussed and let me know if I can assist you in the future.   Screening recommendations/referrals: Colonoscopy: Completed 08/13/2016-Due-08/13/2021 Recommended yearly ophthalmology/optometry visit for glaucoma screening and checkup Recommended yearly dental visit for hygiene and checkup  Vaccinations: Influenza vaccine: Due-May obtain vaccine at out office ot your local pharmacy Pneumococcal vaccine: Completed vaccines Tdap vaccine: Up to date- Due 09/21/2028 Shingles vaccine: Discuss with pharmacy   Covid-19: Declined  Advanced directives: Declined information today.  Conditions/risks identified: See problem list  Next appointment: Follow up in one year for your annual wellness visit.   Preventive Care 32 Years and Older, Male Preventive care refers to lifestyle choices and visits with your health care provider that can promote health and wellness. What does preventive care include?  A yearly physical exam. This is also called an annual well check.  Dental exams once or twice a year.  Routine eye exams. Ask your health care provider how often you should have your eyes checked.  Personal lifestyle choices, including:  Daily care of your teeth and gums.  Regular physical activity.  Eating a healthy diet.  Avoiding tobacco and drug use.  Limiting alcohol use.  Practicing safe sex.  Taking low doses of aspirin every day.  Taking vitamin and mineral supplements as recommended by your health care provider. What happens during an annual well check? The services and screenings done by your health care provider during your annual well check will depend on your age, overall health, lifestyle risk factors, and family history of disease. Counseling  Your health care provider may ask you questions about  your:  Alcohol use.  Tobacco use.  Drug use.  Emotional well-being.  Home and relationship well-being.  Sexual activity.  Eating habits.  History of falls.  Memory and ability to understand (cognition).  Work and work Statistician. Screening  You may have the following tests or measurements:  Height, weight, and BMI.  Blood pressure.  Lipid and cholesterol levels. These may be checked every 5 years, or more frequently if you are over 55 years old.  Skin check.  Lung cancer screening. You may have this screening every year starting at age 26 if you have a 30-pack-year history of smoking and currently smoke or have quit within the past 15 years.  Fecal occult blood test (FOBT) of the stool. You may have this test every year starting at age 2.  Flexible sigmoidoscopy or colonoscopy. You may have a sigmoidoscopy every 5 years or a colonoscopy every 10 years starting at age 9.  Prostate cancer screening. Recommendations will vary depending on your family history and other risks.  Hepatitis C blood test.  Hepatitis B blood test.  Sexually transmitted disease (STD) testing.  Diabetes screening. This is done by checking your blood sugar (glucose) after you have not eaten for a while (fasting). You may have this done every 1-3 years.  Abdominal aortic aneurysm (AAA) screening. You may need this if you are a current or former smoker.  Osteoporosis. You may be screened starting at age 46 if you are at high risk. Talk with your health care provider about your test results, treatment options, and if necessary, the need for more tests. Vaccines  Your health care provider may recommend certain vaccines, such as:  Influenza vaccine. This is recommended every year.  Tetanus, diphtheria, and  acellular pertussis (Tdap, Td) vaccine. You may need a Td booster every 10 years.  Zoster vaccine. You may need this after age 25.  Pneumococcal 13-valent conjugate (PCV13) vaccine.  One dose is recommended after age 28.  Pneumococcal polysaccharide (PPSV23) vaccine. One dose is recommended after age 54. Talk to your health care provider about which screenings and vaccines you need and how often you need them. This information is not intended to replace advice given to you by your health care provider. Make sure you discuss any questions you have with your health care provider. Document Released: 06/08/2015 Document Revised: 01/30/2016 Document Reviewed: 03/13/2015 Elsevier Interactive Patient Education  2017 [place] Prevention in the Home Falls can cause injuries. They can happen to people of all ages. There are many things you can do to make your home safe and to help prevent falls. What can I do on the outside of my home?  Regularly fix the edges of walkways and driveways and fix any cracks.  Remove anything that might make you trip as you walk through a door, such as a raised step or threshold.  Trim any bushes or trees on the path to your home.  Use bright outdoor lighting.  Clear any walking paths of anything that might make someone trip, such as rocks or tools.  Regularly check to see if handrails are loose or broken. Make sure that both sides of any steps have handrails.  Any raised decks and porches should have guardrails on the edges.  Have any leaves, snow, or ice cleared regularly.  Use sand or salt on walking paths during winter.  Clean up any spills in your garage right away. This includes oil or grease spills. What can I do in the bathroom?  Use night lights.  Install grab bars by the toilet and in the tub and shower. Do not use towel bars as grab bars.  Use non-skid mats or decals in the tub or shower.  If you need to sit down in the shower, use a plastic, non-slip stool.  Keep the floor dry. Clean up any water that spills on the floor as soon as it happens.  Remove soap buildup in the tub or shower regularly.  Attach bath  mats securely with double-sided non-slip rug tape.  Do not have throw rugs and other things on the floor that can make you trip. What can I do in the bedroom?  Use night lights.  Make sure that you have a light by your bed that is easy to reach.  Do not use any sheets or blankets that are too big for your bed. They should not hang down onto the floor.  Have a firm chair that has side arms. You can use this for support while you get dressed.  Do not have throw rugs and other things on the floor that can make you trip. What can I do in the kitchen?  Clean up any spills right away.  Avoid walking on wet floors.  Keep items that you use a lot in easy-to-reach places.  If you need to reach something above you, use a strong step stool that has a grab bar.  Keep electrical cords out of the way.  Do not use floor polish or wax that makes floors slippery. If you must use wax, use non-skid floor wax.  Do not have throw rugs and other things on the floor that can make you trip. What can I do with my stairs?  Do not leave any items on the stairs.  Make sure that there are handrails on both sides of the stairs and use them. Fix handrails that are broken or loose. Make sure that handrails are as long as the stairways.  Check any carpeting to make sure that it is firmly attached to the stairs. Fix any carpet that is loose or worn.  Avoid having throw rugs at the top or bottom of the stairs. If you do have throw rugs, attach them to the floor with carpet tape.  Make sure that you have a light switch at the top of the stairs and the bottom of the stairs. If you do not have them, ask someone to add them for you. What else can I do to help prevent falls?  Wear shoes that:  Do not have high heels.  Have rubber bottoms.  Are comfortable and fit you well.  Are closed at the toe. Do not wear sandals.  If you use a stepladder:  Make sure that it is fully opened. Do not climb a closed  stepladder.  Make sure that both sides of the stepladder are locked into place.  Ask someone to hold it for you, if possible.  Clearly mark and make sure that you can see:  Any grab bars or handrails.  First and last steps.  Where the edge of each step is.  Use tools that help you move around (mobility aids) if they are needed. These include:  Canes.  Walkers.  Scooters.  Crutches.  Turn on the lights when you go into a dark area. Replace any light bulbs as soon as they burn out.  Set up your furniture so you have a clear path. Avoid moving your furniture around.  If any of your floors are uneven, fix them.  If there are any pets around you, be aware of where they are.  Review your medicines with your doctor. Some medicines can make you feel dizzy. This can increase your chance of falling. Ask your doctor what other things that you can do to help prevent falls. This information is not intended to replace advice given to you by your health care provider. Make sure you discuss any questions you have with your health care provider. Document Released: 03/08/2009 Document Revised: 10/18/2015 Document Reviewed: 06/16/2014 Elsevier Interactive Patient Education  2017 [employer].

## 2020-05-09 ENCOUNTER — Other Ambulatory Visit: Payer: Self-pay | Admitting: Family Medicine

## 2020-05-09 NOTE — Telephone Encounter (Signed)
Meclizine last filled on 03/11/19 #45 with no refills. Ondansetron last filled 11/02/19 #20 with no refills. Patient last seen on 11/11/19 for bronchitis with no future appointment scheduled.

## 2020-05-10 ENCOUNTER — Telehealth: Payer: Self-pay

## 2020-05-10 NOTE — Progress Notes (Signed)
    Chronic Care Management Pharmacy Assistant   Name: Taylor Harvey.  MRN: [id number] DOB: 04/22/1951  Reason for Encounter: Medication Review   PCP : Midge Minium, MD  Allergies:  No Known Allergies  Medications: Outpatient Encounter Medications as of 05/10/2020  Medication Sig  . ADVAIR DISKUS 250-50 MCG/DOSE AEPB INHALE 1 PUFF INTO LUNGS TWICE DAILY  . aspirin 81 MG tablet Take 81 mg by mouth daily.    . citalopram (CELEXA) 20 MG tablet Take 1 tablet (20 mg total) by mouth daily.  Marland Kitchen esomeprazole (NEXIUM) 40 MG capsule Take 1 capsule (40 mg total) by mouth daily.  Marland Kitchen HYDROcodone-acetaminophen (NORCO/VICODIN) 5-325 MG tablet Take 1 tablet by mouth every 6 (six) hours as needed for moderate pain. (Patient not taking: Reported on 04/30/2020)  . meclizine (ANTIVERT) 25 MG tablet TAKE 1 TABLET BY MOUTH THREE TIMES DAILY AS NEEDED FOR DIZZINESS  . meloxicam (MOBIC) 15 MG tablet Take 1 tablet (15 mg total) by mouth daily. (Patient not taking: Reported on 12/02/2019)  . ondansetron (ZOFRAN) 4 MG tablet TAKE 1 TABLET BY MOUTH EVERY 8 HOURS AS NEEDED FOR NAUSEA OR VOMITING  . simvastatin (ZOCOR) 10 MG tablet Take 1 tablet (10 mg total) by mouth daily.  Marland Kitchen telmisartan (MICARDIS) 80 MG tablet Take 1 tablet by mouth once daily  . traZODone (DESYREL) 50 MG tablet TAKE 1/2 TO 1 (ONE-HALF TO ONE) TABLET BY MOUTH ONCE DAILY AT BEDTIME AS NEEDED FOR SLEEP   No facility-administered encounter medications on file as of 05/10/2020.    Current Diagnosis: Patient Active Problem List   Diagnosis Date Noted  . GERD (gastroesophageal reflux disease) 04/14/2019  . Status post arthroscopy of left shoulder 10/15/2017  . Lateral epicondylitis, left elbow 04/08/2017  . Chronic left shoulder pain 02/17/2017  . Pain in left elbow 02/17/2017  . Left upper arm pain 01/20/2017  . Insomnia 07/19/2014  . Urinary frequency 07/19/2014  . Physical exam 07/19/2014  . Morbid obesity ([place]) 06/28/2013  .  Hyperlipidemia 11/24/2008  . ANXIETY STATE, UNSPECIFIED 06/02/2008  . NEOPLASM, SKIN, UNCERTAIN BEHAVIOR 99/24/2683  . TRIGGER FINGER 03/02/2008  . BACK PAIN WITH RADICULOPATHY 03/02/2008  . BACK PAIN 07/15/2007  . DYSPNEA ON EXERTION 11/09/2006  . POLYP, COLON 07/19/2006  . Essential hypertension 07/19/2006  . Sleep apnea 07/19/2006  . DIVERTICULOSIS, ASYMPTOMATIC 06/11/2006    Reviewed chart for medication changes ahead of medication coordination call.  No OVs, Consults, or hospital visits since last care coordination call/Pharmacist visit. (If appropriate, list visit date, provider name)  No medication changes indicated OR if recent visit, treatment plan here.  BP Readings from Last 3 Encounters:  10/21/19 122/86  10/04/19 (!) 118/56  07/01/19 136/81    Lab Results  Component Value Date   HGBA1C 6.3 04/14/2019     Patient obtains medications through [facility]  90 Days   Patient is due for next adherence delivery on: 05-25-20  Called patient and reviewed medications . Patient is not needing any medications. Per patient is not needing any delivery of medications until February.   Georgiana Shore ,[place] Pharmacist Assistant [phone]  Follow-Up:  Pharmacist Review

## 2020-05-22 ENCOUNTER — Ambulatory Visit (INDEPENDENT_AMBULATORY_CARE_PROVIDER_SITE_OTHER): Payer: PPO | Admitting: Family Medicine

## 2020-05-22 ENCOUNTER — Encounter: Payer: Self-pay | Admitting: Family Medicine

## 2020-05-22 ENCOUNTER — Other Ambulatory Visit (INDEPENDENT_AMBULATORY_CARE_PROVIDER_SITE_OTHER): Payer: PPO

## 2020-05-22 ENCOUNTER — Other Ambulatory Visit: Payer: Self-pay

## 2020-05-22 VITALS — BP 150/80 | HR 73 | Temp 98.2°F | Resp 20 | Ht 68.0 in | Wt 256.4 lb

## 2020-05-22 DIAGNOSIS — M545 Low back pain, unspecified: Secondary | ICD-10-CM | POA: Diagnosis not present

## 2020-05-22 DIAGNOSIS — R350 Frequency of micturition: Secondary | ICD-10-CM | POA: Diagnosis not present

## 2020-05-22 DIAGNOSIS — Z20822 Contact with and (suspected) exposure to covid-19: Secondary | ICD-10-CM | POA: Diagnosis not present

## 2020-05-22 LAB — POCT URINALYSIS DIPSTICK OB
Bilirubin, UA: NEGATIVE
Blood, UA: 7.5
Glucose, UA: NEGATIVE
Ketones, UA: NEGATIVE
Leukocytes, UA: NEGATIVE
Nitrite, UA: NEGATIVE
Spec Grav, UA: >=1.03 — AB (ref 1.010–1.025)
Urobilinogen, UA: 0.2 E.U./dL
pH, UA: 5 (ref 5.0–8.0)

## 2020-05-22 NOTE — Patient Instructions (Addendum)
Follow up as needed or as scheduled I am afraid you have COVID- please go get tested ASAP [email or web address] Drink LOTS of fluids REST! Tylenol or ibuprofen for fever or body aches Vit D, Vit C, Zinc supplements to help boost your immune system Mucinex DM for cough and congestion Call with any questions or concerns Hang in there!  Keep me updated!

## 2020-05-22 NOTE — Progress Notes (Signed)
   Subjective:    Patient ID: Taylor Rings., male    DOB: 21-Jul-1950, 69 y.o.   MRN: [id number]  HPI Pt reports chills, low grade fever (started Sunday).  'dull ache in my lower back'- sxs started ~1 week ago.  + headache.  Cough started yesterday.  + fatigue.  + SOB w/ minimal exertion (this is new).  + sick contact- daughter.  Son was sent home sick w/ similar sxs and work is requesting a COVID test.  Wife is on O2 w/ COPD.  He is unvaccinated.  + suprapubic pressure- denies dysuria, increased urinary frequency yesterday (8-10 times yesterday)  Not consistent w/ kidney stone pain.  No blood in urine.   Review of Systems For ROS see HPI   This visit occurred during the SARS-CoV-2 public health emergency.  Safety protocols were in place, including screening questions prior to the visit, additional usage of staff PPE, and extensive cleaning of exam room while observing appropriate contact time as indicated for disinfecting solutions.       Objective:   Physical Exam Vitals reviewed.  Constitutional:      General: He is not in acute distress.    Appearance: Normal appearance. He is obese. He is not toxic-appearing.  HENT:     Head: Normocephalic and atraumatic.  Eyes:     Extraocular Movements: Extraocular movements intact.     Conjunctiva/sclera: Conjunctivae normal.     Pupils: Pupils are equal, round, and reactive to light.  Cardiovascular:     Rate and Rhythm: Normal rate and regular rhythm.     Pulses: Normal pulses.  Pulmonary:     Effort: Pulmonary effort is normal. No respiratory distress.     Breath sounds: Normal breath sounds. No wheezing or rhonchi.     Comments: Hacking cough throughout visit Abdominal:     General: Abdomen is flat.     Palpations: Abdomen is soft.     Tenderness: There is no abdominal tenderness. There is no right CVA tenderness, left CVA tenderness, guarding or rebound.  Musculoskeletal:     Cervical back: Normal range of motion and neck  supple.  Lymphadenopathy:     Cervical: No cervical adenopathy.  Neurological:     General: No focal deficit present.     Mental Status: He is alert and oriented to person, place, and time.           Assessment & Plan:  Suspected COVID- unfortunately pt's sxs are not likely urinary in nature and his back pain is likely due to COVID.  He has body aches, back pain, chills, low grade fever, cough, SOB w/ minimal exertion, fatigue, HA and both his son and daughter have similar sxs.  Pt did not wear his mask correctly throughout the visit and nose was exposed while he coughed.  Reviewed that he qualifies for the antibody infusion and he needs to be tested ASAP.  He is not vaccinated and is at risk for severe disease.  Pt is not sure he is interested in the infusion.  Looked up testing information throughout the county and provided a website for him to register to be tested.  Reviewed supportive care and red flags that should prompt return.  Pt expressed understanding and is in agreement w/ plan.

## 2020-05-23 LAB — URINE CULTURE
MICRO NUMBER:: 11362149
SPECIMEN QUALITY:: ADEQUATE

## 2020-05-24 ENCOUNTER — Telehealth: Payer: Self-pay | Admitting: Family Medicine

## 2020-05-24 NOTE — Telephone Encounter (Signed)
Taylor Harvey was returning your call from before lunch.  Please call him back

## 2020-05-28 NOTE — Telephone Encounter (Signed)
Called patient with urine culture results. Patient voiced understanding. Patient also states that he was not able to get tested due to extended wait times. He states he is feeling a little better.

## 2020-05-30 ENCOUNTER — Encounter: Payer: Self-pay | Admitting: Family Medicine

## 2020-05-30 ENCOUNTER — Telehealth (INDEPENDENT_AMBULATORY_CARE_PROVIDER_SITE_OTHER): Payer: PPO | Admitting: Family Medicine

## 2020-05-30 ENCOUNTER — Telehealth: Payer: Self-pay | Admitting: Family Medicine

## 2020-05-30 DIAGNOSIS — Z20822 Contact with and (suspected) exposure to covid-19: Secondary | ICD-10-CM

## 2020-05-30 DIAGNOSIS — Z23 Encounter for immunization: Secondary | ICD-10-CM | POA: Diagnosis not present

## 2020-05-30 MED ORDER — GUAIFENESIN-CODEINE 100-10 MG/5ML PO SYRP
10.0000 mL | ORAL_SOLUTION | Freq: Three times a day (TID) | ORAL | 0 refills | Status: DC | PRN
Start: 2020-05-30 — End: 2020-07-18

## 2020-05-30 NOTE — Telephone Encounter (Signed)
Spoke with patient's wife. Patient scheduled for virtual visit 05/30/20.

## 2020-05-30 NOTE — Telephone Encounter (Signed)
Pt's wife called in stating that the Mucinex DM is not helping with the cough. She wanted to know if Dr. Beverely Low could call something in for a cough. Pt uses [facility] [address].  Please advise

## 2020-05-30 NOTE — Progress Notes (Signed)
Virtual Visit via Video   I connected with patient on 05/30/20 at  1:30 PM EST by a video enabled telemedicine application and verified that I am speaking with the correct person using two identifiers.  Location patient: Home Location provider: [place] [facility], Office Persons participating in the virtual visit: Patient, Provider, CMA (Sabrina M)  I discussed the limitations of evaluation and management by telemedicine and the availability of in person appointments. The patient expressed understanding and agreed to proceed.  Subjective:   HPI:   Suspected COVID- pt was seen on 12/28 and had multiple COVID sxs.  He was strongly encouraged to be tested at that time as he would likely qualify for the antibody infusion.  He did not get tested- 'I couldn't find anywhere to get a test'.  Pt reports cough is near constant- intermittently productive.  Unable to sleep.  No improvement w/ Delsym or Robitussin.  'i'm just wiped out'.  Body aches have resolved as has low grade fever.  + HA.  SOB is improving.  Pt reports he is able to drink w/o difficulty, no worries about dehydration.  Taste and smell are now altered.  ROS:   See pertinent positives and negatives per HPI.  Patient Active Problem List   Diagnosis Date Noted  . GERD (gastroesophageal reflux disease) 04/14/2019  . Status post arthroscopy of left shoulder 10/15/2017  . Lateral epicondylitis, left elbow 04/08/2017  . Chronic left shoulder pain 02/17/2017  . Pain in left elbow 02/17/2017  . Left upper arm pain 01/20/2017  . Insomnia 07/19/2014  . Urinary frequency 07/19/2014  . Physical exam 07/19/2014  . Morbid obesity ([facility]) 06/28/2013  . Hyperlipidemia 11/24/2008  . ANXIETY STATE, UNSPECIFIED 06/02/2008  . NEOPLASM, SKIN, UNCERTAIN BEHAVIOR 05/22/2008  . TRIGGER FINGER 03/02/2008  . BACK PAIN WITH RADICULOPATHY 03/02/2008  . BACK PAIN 07/15/2007  . DYSPNEA ON EXERTION 11/09/2006  . POLYP, COLON 07/19/2006  .  Essential hypertension 07/19/2006  . Sleep apnea 07/19/2006  . DIVERTICULOSIS, ASYMPTOMATIC 06/11/2006    Social History   Tobacco Use  . Smoking status: Never Smoker  . Smokeless tobacco: Never Used  Substance Use Topics  . Alcohol use: No    Current Outpatient Medications:  .  ADVAIR DISKUS 250-50 MCG/DOSE AEPB, INHALE 1 PUFF INTO LUNGS TWICE DAILY, Disp: 60 each, Rfl: 0 .  aspirin 81 MG tablet, Take 81 mg by mouth daily., Disp: , Rfl:  .  citalopram (CELEXA) 20 MG tablet, Take 1 tablet (20 mg total) by mouth daily., Disp: 90 tablet, Rfl: 1 .  esomeprazole (NEXIUM) 40 MG capsule, Take 1 capsule (40 mg total) by mouth daily., Disp: 90 capsule, Rfl: 1 .  meclizine (ANTIVERT) 25 MG tablet, TAKE 1 TABLET BY MOUTH THREE TIMES DAILY AS NEEDED FOR DIZZINESS, Disp: 45 tablet, Rfl: 0 .  simvastatin (ZOCOR) 10 MG tablet, Take 1 tablet (10 mg total) by mouth daily., Disp: 90 tablet, Rfl: 1 .  telmisartan (MICARDIS) 80 MG tablet, Take 1 tablet by mouth once daily, Disp: 90 tablet, Rfl: 0 .  traZODone (DESYREL) 50 MG tablet, TAKE 1/2 TO 1 (ONE-HALF TO ONE) TABLET BY MOUTH ONCE DAILY AT BEDTIME AS NEEDED FOR SLEEP, Disp: 90 tablet, Rfl: 0 .  HYDROcodone-acetaminophen (NORCO/VICODIN) 5-325 MG tablet, Take 1 tablet by mouth every 6 (six) hours as needed for moderate pain. (Patient not taking: No sig reported), Disp: 30 tablet, Rfl: 0 .  meloxicam (MOBIC) 15 MG tablet, Take 1 tablet (15 mg total) by mouth  daily. (Patient not taking: No sig reported), Disp: 30 tablet, Rfl: 0 .  ondansetron (ZOFRAN) 4 MG tablet, TAKE 1 TABLET BY MOUTH EVERY 8 HOURS AS NEEDED FOR NAUSEA OR VOMITING, Disp: 20 tablet, Rfl: 0  No Known Allergies  Objective:   There were no vitals taken for this visit. AAOx3, NAD NCAT, EOMI No obvious CN deficits Dry, hacking cough Pt is able to speak clearly, coherently without shortness of breath or increased work of breathing.  Thought process is linear.  Mood is appropriate.    Assessment and Plan:   Suspected COVID- I am even more sure of the dx now that pt says his taste and smell are altered.  He states he was not able to find a testing center that didn't have a 7 hr wait so we don't have an official dx but clinically it fits.  Since the cough and fatigue are the most bothersome part at this point, will start Cheratussin to help w/ both cough and sleep.  Reviewed supportive care and red flags that should prompt return.  Pt expressed understanding and is in agreement w/ plan.    Neena Rhymes, MD 05/30/2020

## 2020-05-30 NOTE — Telephone Encounter (Signed)
Since I was worried that pt had COVID, I would like him to be re-assessed since he wasn't vaccinated and is still having notable cough.  Needs a video visit

## 2020-05-30 NOTE — Progress Notes (Signed)
I connected with  Taylor Harvey. on 05/30/20 by a video enabled telemedicine application and verified that I am speaking with the correct person using two identifiers.   I discussed the limitations of evaluation and management by telemedicine. The patient expressed understanding and agreed to proceed.

## 2020-06-01 ENCOUNTER — Ambulatory Visit: Payer: PPO

## 2020-06-01 DIAGNOSIS — I10 Essential (primary) hypertension: Secondary | ICD-10-CM

## 2020-06-01 DIAGNOSIS — E785 Hyperlipidemia, unspecified: Secondary | ICD-10-CM

## 2020-06-01 NOTE — Progress Notes (Signed)
Chronic Care Management Pharmacy  Name: Taylor Harvey.  MRN: [id number] DOB: July 13, 1950  Chief Complaint/ HPI Taylor Hua.,  70 y.o. , male presents for their Follow-Up CCM visit with the clinical pharmacist via telephone due to COVID-19 Pandemic.  PCP : Midge Minium, MD  Their chronic conditions include: HLD, HTN, GERD  Office Visits: 05/30/2020 (PCP): virtual visit. pt not able to find local test site without 7 hr wait, now presenting with altered taste and smell in addition to cough and fatigue. Start cheratussin to help with both cough and sleep. 05/22/2020 (PCP): suspected COVID-19 virus infection.   Patient Active Problem List   Diagnosis Date Noted  . GERD (gastroesophageal reflux disease) 04/14/2019  . Status post arthroscopy of left shoulder 10/15/2017  . Lateral epicondylitis, left elbow 04/08/2017  . Chronic left shoulder pain 02/17/2017  . Pain in left elbow 02/17/2017  . Left upper arm pain 01/20/2017  . Insomnia 07/19/2014  . Urinary frequency 07/19/2014  . Physical exam 07/19/2014  . Morbid obesity ([place]) 06/28/2013  . Hyperlipidemia 11/24/2008  . ANXIETY STATE, UNSPECIFIED 06/02/2008  . NEOPLASM, SKIN, UNCERTAIN BEHAVIOR 28/31/5176  . TRIGGER FINGER 03/02/2008  . BACK PAIN WITH RADICULOPATHY 03/02/2008  . BACK PAIN 07/15/2007  . DYSPNEA ON EXERTION 11/09/2006  . POLYP, COLON 07/19/2006  . Essential hypertension 07/19/2006  . Sleep apnea 07/19/2006  . DIVERTICULOSIS, ASYMPTOMATIC 06/11/2006   Past Surgical History:  Procedure Laterality Date  . COLONOSCOPY    . INSERTION OF MESH  05/05/2012   Procedure: INSERTION OF MESH;  Surgeon: Rolm Bookbinder, MD;  Location: [facility];  Service: General;  Laterality: N/A;  . LITHOTRIPSY     x2  . SHOULDER ARTHROSCOPY  07/2017  . trigger finger surgery  2006  . UMBILICAL HERNIA REPAIR  05/05/2012   Procedure: HERNIA REPAIR UMBILICAL ADULT;  Surgeon: Rolm Bookbinder, MD;   Location: [facility];  Service: General;  Laterality: N/A;   Social History   Socioeconomic History  . Marital status: Married    Spouse name: Not on file  . Number of children: 6  . Years of education: Not on file  . Highest education level: Not on file  Occupational History  . Occupation: retired  Tobacco Use  . Smoking status: Never Smoker  . Smokeless tobacco: Never Used  Vaping Use  . Vaping Use: Never used  Substance and Sexual Activity  . Alcohol use: No  . Drug use: No  . Sexual activity: Yes    Partners: Female  Other Topics Concern  . Not on file  Social History Narrative   Exercise--walks a mile 5 days a week.  One deceased child age 36.  One stepchild, 5 adopted.    Social Determinants of Health   Financial Resource Strain: Low Risk   . Difficulty of Paying Living Expenses: Not hard at all  Food Insecurity: No Food Insecurity  . Worried About Charity fundraiser in the Last Year: Never true  . Ran Out of Food in the Last Year: Never true  Transportation Needs: No Transportation Needs  . Lack of Transportation (Medical): No  . Lack of Transportation (Non-Medical): No  Physical Activity: Insufficiently Active  . Days of Exercise per Week: 1 day  . Minutes of Exercise per Session: 60 min  Stress: No Stress Concern Present  . Feeling of Stress : Not at all  Social Connections: Socially Integrated  . Frequency of Communication with Friends and  Family: More than three times a week  . Frequency of Social Gatherings with Friends and Family: More than three times a week  . Attends Religious Services: More than 4 times per year  . Active Member of Clubs or Organizations: Yes  . Attends Archivist Meetings: More than 4 times per year  . Marital Status: Married   Family History  Problem Relation Age of Onset  . Diabetes Mother   . Pancreatitis Mother   . Heart disease Mother 64       MI, died with MI  . Stroke Father   . Hypertension  Father   . Peripheral vascular disease Father        Carotid surgery  . Heart disease Sister        Arrhythmia  . Diabetes Sister 43       DM  . Kidney cancer Sister   . Diabetes Sister   . Cancer Sister        colon  . Heart disease Sister   . Diabetes Sister   . Heart disease Sister   . Stroke Brother   . Colon cancer Sister   . Pancreatic cancer Other    No Known Allergies Outpatient Encounter Medications as of 06/01/2020  Medication Sig  . ADVAIR DISKUS 250-50 MCG/DOSE AEPB INHALE 1 PUFF INTO LUNGS TWICE DAILY  . aspirin 81 MG tablet Take 81 mg by mouth daily.  . citalopram (CELEXA) 20 MG tablet Take 1 tablet (20 mg total) by mouth daily.  Marland Kitchen esomeprazole (NEXIUM) 40 MG capsule Take 1 capsule (40 mg total) by mouth daily.  Marland Kitchen guaiFENesin-codeine (ROBITUSSIN AC) 100-10 MG/5ML syrup Take 10 mLs by mouth 3 (three) times daily as needed for cough.  Marland Kitchen HYDROcodone-acetaminophen (NORCO/VICODIN) 5-325 MG tablet Take 1 tablet by mouth every 6 (six) hours as needed for moderate pain. (Patient not taking: No sig reported)  . meclizine (ANTIVERT) 25 MG tablet TAKE 1 TABLET BY MOUTH THREE TIMES DAILY AS NEEDED FOR DIZZINESS  . meloxicam (MOBIC) 15 MG tablet Take 1 tablet (15 mg total) by mouth daily. (Patient not taking: No sig reported)  . ondansetron (ZOFRAN) 4 MG tablet TAKE 1 TABLET BY MOUTH EVERY 8 HOURS AS NEEDED FOR NAUSEA OR VOMITING  . simvastatin (ZOCOR) 10 MG tablet Take 1 tablet (10 mg total) by mouth daily.  Marland Kitchen telmisartan (MICARDIS) 80 MG tablet Take 1 tablet by mouth once daily  . traZODone (DESYREL) 50 MG tablet TAKE 1/2 TO 1 (ONE-HALF TO ONE) TABLET BY MOUTH ONCE DAILY AT BEDTIME AS NEEDED FOR SLEEP   No facility-administered encounter medications on file as of 06/01/2020.   Patient Care Team    Relationship Specialty Notifications Start End  Midge Minium, MD PCP - General Family Medicine  03/17/14    Comment: Gwendlyn Deutscher, MD Consulting Physician  Gastroenterology  07/21/16   Mcarthur Rossetti, MD Consulting Physician Orthopedic Surgery  07/23/17   Madelin Rear, [facility] Pharmacist Pharmacist  08/26/19    Comment: phone number [phone]   Current Diagnosis/Assessment:  Goals Addressed            This Visit's Progress   . PharmD Care Plan   On track    CARE PLAN ENTRY  Current Barriers:  . Chronic Disease Management support, education, and care coordination needs related to HTN and HLD  Pharmacist Clinical Goal(s):  Marland Kitchen Maintain BP <140/90  and LDL < 70 over next 6 months . Previous goal  of losing 5 lbs by 12/01/2019 o Down 7 lbs to 248 lbs 12/02/2019.  . 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 Comprehensive medication review performed. . Diet/weight loss plan recommended. . Coordination medication management services.  Patient Self Care Activities:  . Patient verbalizes understanding of plan to call with any questions.  Initial goal documentation.      Hyperlipidemia   LDL goal < 100     Component Value Date/Time   CHOL 108 04/14/2019 1015   TRIG 111.0 04/14/2019 1015   TRIG 89 05/01/2006 0942   HDL 31.80 (L) 04/14/2019 1015   CHOLHDL 3 04/14/2019 1015   VLDL 22.2 04/14/2019 1015   LDLCALC 54 04/14/2019 1015   LDLDIRECT 47.0 02/16/2017 1455    The ASCVD Risk score (Goff DC Jr., et al., 2013) failed to calculate for the following reasons:   The valid total cholesterol range is 130 to 320 mg/dL   Patient has failed these meds in past: n/a.  Was back to walking daily, recently put on hold due to suspected covid infection.  Diet exampe: iet example: one or 2 packs of peanut butter crackers for breakfast, sandwich/soup for lunch/dinner. Sweets/snacks such as icecream and oatmeal and chocolate   LDL controlled 03/2019, due for f/u once infection resolved/out of quarantine. Denies side effects or issues with tolerability.  Patient is currently taking:  Simvastatin  20 mg daily.   We discussed:  Diet and exercise recommendations - Maintain a healthy weight and exercise regularly, as directed by your health care provider. Eat healthy foods, such as: Lean proteins, complex carbohydrates, fresh fruits and vegetables, low-fat dairy products, healthy fats.  Plan  Continue current medications.   Hypertension   BP goal < 140/90  Office blood pressures are  BP Readings from Last 3 Encounters:  05/22/20 (!) 150/80  10/21/19 122/86  10/04/19 (!) 118/56   Reports fatigue from suspected covid 19 infection.  Denies dizziness at present. Patient is currently controlled on the following medications:   Telmisartan 80 mg daily   Reviewed recommendations for diet/exercise and home BP monitoring  Plan  Continue current medications and control with diet and exercise.     Medication Management   Receives prescription medications from:  [facility] - [place], [place] - [address] [place] [address] Phone: [phone] Fax: [phone]  [facility] [address] [place] - [address] [place] [address] Phone: [phone] Fax: [phone]   Denies any issues with current medication management.   Plan  Continue current medication management strategy.  Follow up:  CPA: Med synch calls RPH: 6 month telephone visit.  HLD, HTN, weight loss.  Future Appointments  Date Time Provider Department Center  12/20/2020  1:30 PM [facility] CCM PHARMACIST [facility]   Dahlia Byes, Pharm.D., BCGP Clinical Pharmacist  Primary Care at [facility] [phone]

## 2020-06-03 NOTE — Patient Instructions (Signed)
Mr. Taylor Harvey,  Thank you for taking the time to review your medications with me today.  I have included our care plan/goals in the following pages. Please review and call me at [phone] with any questions!  Thanks! Ellin Mayhew, Pharm.D., BCGP Clinical Pharmacist [facility] [phone]  Goals Addressed            This Visit's Progress   . PharmD Care Plan   On track    CARE PLAN ENTRY  Current Barriers:  . Chronic Disease Management support, education, and care coordination needs related to HTN and HLD  Pharmacist Clinical Goal(s):  Marland Kitchen Maintain BP <140/90  and LDL < 70 over next 6 months . Previous goal of losing 5 lbs by 12/01/2019 o Down 7 lbs to 248 lbs 12/02/2019.  . 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 Comprehensive medication review performed. . Diet/weight loss plan recommended. . Coordination medication management services.  Patient Self Care Activities:  . Patient verbalizes understanding of plan to call with any questions.  Initial goal documentation.      The patient verbalized understanding of instructions provided today and agreed to receive a mailed copy of patient instruction and/or educational materials. Telephone follow up appointment with pharmacy team member scheduled for: See next appointment with "Care Management Staff" under "What's Next" below.

## 2020-06-05 ENCOUNTER — Other Ambulatory Visit: Payer: Self-pay | Admitting: Family Medicine

## 2020-06-11 ENCOUNTER — Other Ambulatory Visit: Payer: Self-pay | Admitting: Family Medicine

## 2020-06-11 DIAGNOSIS — F329 Major depressive disorder, single episode, unspecified: Secondary | ICD-10-CM

## 2020-06-11 NOTE — Telephone Encounter (Signed)
Requesting:Desyrel 50mg  Contract: UDS: Last Visit1/5/22 v/v: Next Visit:12/20/20 CCM telephone call Last Refill:04/18/20 90 tabs 0 refills Please Advise

## 2020-06-18 ENCOUNTER — Telehealth: Payer: Self-pay

## 2020-06-18 NOTE — Chronic Care Management (AMB) (Signed)
Chronic Care Management Pharmacy Assistant   Name: Taylor Harvey.  MRN: [id number] DOB: Sep 15, 1950  Reason for Encounter: Medication Review/ Refill Process  PCP : Midge Minium, MD  Allergies:  No Known Allergies  Medications: Outpatient Encounter Medications as of 06/18/2020  Medication Sig  . ADVAIR DISKUS 250-50 MCG/DOSE AEPB INHALE 1 PUFF INTO LUNGS TWICE DAILY  . aspirin 81 MG tablet Take 81 mg by mouth daily.  . citalopram (CELEXA) 20 MG tablet TAKE ONE TABLET BY MOUTH EVERY MORNING  . esomeprazole (NEXIUM) 40 MG capsule TAKE ONE CAPSULE BY MOUTH EVERY MORNING  . guaiFENesin-codeine (ROBITUSSIN AC) 100-10 MG/5ML syrup Take 10 mLs by mouth 3 (three) times daily as needed for cough.  Marland Kitchen HYDROcodone-acetaminophen (NORCO/VICODIN) 5-325 MG tablet Take 1 tablet by mouth every 6 (six) hours as needed for moderate pain. (Patient not taking: No sig reported)  . meclizine (ANTIVERT) 25 MG tablet TAKE 1 TABLET BY MOUTH THREE TIMES DAILY AS NEEDED FOR DIZZINESS  . meloxicam (MOBIC) 15 MG tablet Take 1 tablet (15 mg total) by mouth daily. (Patient not taking: No sig reported)  . ondansetron (ZOFRAN) 4 MG tablet TAKE 1 TABLET BY MOUTH EVERY 8 HOURS AS NEEDED FOR NAUSEA OR VOMITING  . simvastatin (ZOCOR) 10 MG tablet Take 1 tablet (10 mg total) by mouth daily.  Marland Kitchen telmisartan (MICARDIS) 80 MG tablet Take 1 tablet by mouth once daily  . traZODone (DESYREL) 50 MG tablet TAKE ONE-HALF TO 1 TABLET BY MOUTH AT bedtime AS NEEDED FOR SLEEP   No facility-administered encounter medications on file as of 06/18/2020.    Current Diagnosis: Patient Active Problem List   Diagnosis Date Noted  . GERD (gastroesophageal reflux disease) 04/14/2019  . Status post arthroscopy of left shoulder 10/15/2017  . Lateral epicondylitis, left elbow 04/08/2017  . Chronic left shoulder pain 02/17/2017  . Pain in left elbow 02/17/2017  . Left upper arm pain 01/20/2017  . Insomnia 07/19/2014  . Urinary  frequency 07/19/2014  . Physical exam 07/19/2014  . Morbid obesity ([place]) 06/28/2013  . Hyperlipidemia 11/24/2008  . ANXIETY STATE, UNSPECIFIED 06/02/2008  . NEOPLASM, SKIN, UNCERTAIN BEHAVIOR 24/40/1027  . TRIGGER FINGER 03/02/2008  . BACK PAIN WITH RADICULOPATHY 03/02/2008  . BACK PAIN 07/15/2007  . DYSPNEA ON EXERTION 11/09/2006  . POLYP, COLON 07/19/2006  . Essential hypertension 07/19/2006  . Sleep apnea 07/19/2006  . DIVERTICULOSIS, ASYMPTOMATIC 06/11/2006    Reviewed chart for medication changes ahead of medication coordination call.  No OVs, Consults, or hospital visits since last care coordination .  No medication changes indicated.  BP Readings from Last 3 Encounters:  05/22/20 (!) 150/80  10/21/19 122/86  10/04/19 (!) 118/56    Lab Results  Component Value Date   HGBA1C 6.3 04/14/2019     Patient obtains medications through Adherence Packaging  90 Days   Last adherence delivery included: -Esomeprazole Mag 40mg  Take one capsule every morning -Trazodone 50mg  Tab  Take  a tab at bedtime -Citalopram 20mg  Tab Take one tab every morning -Telmisartan 80mg  Take one tab every morning -Simvastatin 10mg  Tab Take one tab at bedtime  -Advair Disku Aer 250/50 Inhale 1 puff into the lungs in the morning and at bedtime   Patient declined the following medications:  -Esomeprazole Mag 40mg  Take one capsule every morning ~24 tablets on hand. -Trazodone 50mg  Tab  Take  a tab at bedtime ~ 60 tablets on hand. -Citalopram 20mg  Tab Take one tab every morning ~ 24 tablets  on hand. -Telmisartan 80mg  Take one tab every morning ~ 24 tablets on hand. -Simvastatin 10mg  Tab Take one tab at bedtime ~ 24 tablets on hand. -Advair Disku Aer 250/50 Inhale 1 puff into the lungs in the morning and at bedtime # 26 puffs left  Patient requests a call on 07/09/2020 for his next delivery of his medications.  April D Calhoun, [place] Pharmacist Assistant [phone]   Follow-Up:   Pharmacist Review

## 2020-07-09 ENCOUNTER — Other Ambulatory Visit: Payer: Self-pay | Admitting: Family Medicine

## 2020-07-16 ENCOUNTER — Telehealth: Payer: Self-pay

## 2020-07-16 NOTE — Chronic Care Management (AMB) (Signed)
Chronic Care Management Pharmacy Assistant   Name: Taylor Harvey.  MRN: [id number] DOB: December 07, 1950  Reason for Encounter: Medication Review  PCP : Midge Minium, MD  Allergies:  No Known Allergies  Medications: Outpatient Encounter Medications as of 07/16/2020  Medication Sig  . ADVAIR DISKUS 250-50 MCG/DOSE AEPB INHALE 1 PUFF INTO LUNGS TWICE DAILY  . aspirin 81 MG tablet Take 81 mg by mouth daily.  . citalopram (CELEXA) 20 MG tablet TAKE ONE TABLET BY MOUTH EVERY MORNING  . esomeprazole (NEXIUM) 40 MG capsule TAKE ONE CAPSULE BY MOUTH EVERY MORNING  . guaiFENesin-codeine (ROBITUSSIN AC) 100-10 MG/5ML syrup Take 10 mLs by mouth 3 (three) times daily as needed for cough.  Marland Kitchen HYDROcodone-acetaminophen (NORCO/VICODIN) 5-325 MG tablet Take 1 tablet by mouth every 6 (six) hours as needed for moderate pain. (Patient not taking: No sig reported)  . meclizine (ANTIVERT) 25 MG tablet TAKE 1 TABLET BY MOUTH THREE TIMES DAILY AS NEEDED FOR DIZZINESS  . meloxicam (MOBIC) 15 MG tablet Take 1 tablet (15 mg total) by mouth daily. (Patient not taking: No sig reported)  . ondansetron (ZOFRAN) 4 MG tablet TAKE 1 TABLET BY MOUTH EVERY 8 HOURS AS NEEDED FOR NAUSEA OR VOMITING  . simvastatin (ZOCOR) 10 MG tablet Take 1 tablet (10 mg total) by mouth daily.  Marland Kitchen telmisartan (MICARDIS) 80 MG tablet TAKE ONE TABLET BY MOUTH EVERY MORNING  . traZODone (DESYREL) 50 MG tablet TAKE ONE-HALF TO 1 TABLET BY MOUTH AT bedtime AS NEEDED FOR SLEEP   No facility-administered encounter medications on file as of 07/16/2020.    Current Diagnosis: Patient Active Problem List   Diagnosis Date Noted  . GERD (gastroesophageal reflux disease) 04/14/2019  . Status post arthroscopy of left shoulder 10/15/2017  . Lateral epicondylitis, left elbow 04/08/2017  . Chronic left shoulder pain 02/17/2017  . Pain in left elbow 02/17/2017  . Left upper arm pain 01/20/2017  . Insomnia 07/19/2014  . Urinary frequency  07/19/2014  . Physical exam 07/19/2014  . Morbid obesity ([place]) 06/28/2013  . Hyperlipidemia 11/24/2008  . ANXIETY STATE, UNSPECIFIED 06/02/2008  . NEOPLASM, SKIN, UNCERTAIN BEHAVIOR 20/25/4270  . TRIGGER FINGER 03/02/2008  . BACK PAIN WITH RADICULOPATHY 03/02/2008  . BACK PAIN 07/15/2007  . DYSPNEA ON EXERTION 11/09/2006  . POLYP, COLON 07/19/2006  . Essential hypertension 07/19/2006  . Sleep apnea 07/19/2006  . DIVERTICULOSIS, ASYMPTOMATIC 06/11/2006    Reviewed chart for medication changes ahead of medication coordination call.  No OVs, Consults, or hospital visits since last care coordination call/Pharmacist visit.  No medication changes indicated.  BP Readings from Last 3 Encounters:  05/22/20 (!) 150/80  10/21/19 122/86  10/04/19 (!) 118/56    Lab Results  Component Value Date   HGBA1C 6.3 04/14/2019     Patient obtains medications through Adherence Packaging  90 Days   Last adherence delivery included: Esomeprazole Mag 40 mg Take one capsule every morning Trazodone 50 mg Tab Take  a tab at bedtime Citalopram 20 mg Tab Take one tab every morning Telmisartan 80 mg Take one tab every morning Simvastatin 10 mg Tab Take one tab at bedtime Advair Diskus AER 250/50 Inhale 1 puff into the lungs in the morning and at bedtime  Patient is due for next adherence delivery on: 07/24/2020. Called patient and reviewed medications.  Patient declined the following medications: Esomeprazole Mag 40 mg Take one capsule every morning Trazodone 50 mg Tab Take  a tab at bedtime Citalopram 20 mg Tab  Take one tab every morning Telmisartan 80 mg Take one tab every morning Simvastatin 10 mg Tab Take one tab at bedtime Advair Diskus AER 250/50 Inhale 1 puff into the lungs in the morning and at bedtime  Patient states he had a packaging box that was misplaced and recently found. Patient states he has about a 30 day supply of medications at this time.  Confirmed with patient, no  delivery needed at this time.  April D Calhoun, [place] Pharmacist Assistant [phone]   Follow-Up:  Coordination of Enhanced Pharmacy Services and Pharmacist Review

## 2020-07-18 ENCOUNTER — Encounter: Payer: Self-pay | Admitting: Orthopaedic Surgery

## 2020-07-18 ENCOUNTER — Ambulatory Visit: Payer: PPO | Admitting: Orthopaedic Surgery

## 2020-07-18 ENCOUNTER — Ambulatory Visit (INDEPENDENT_AMBULATORY_CARE_PROVIDER_SITE_OTHER): Payer: PPO

## 2020-07-18 DIAGNOSIS — M65312 Trigger thumb, left thumb: Secondary | ICD-10-CM | POA: Diagnosis not present

## 2020-07-18 MED ORDER — LIDOCAINE HCL 1 % IJ SOLN
0.5000 mL | INTRAMUSCULAR | Status: AC | PRN
Start: 2020-07-18 — End: 2020-07-18
  Administered 2020-07-18: .5 mL

## 2020-07-18 MED ORDER — METHYLPREDNISOLONE ACETATE 40 MG/ML IJ SUSP
20.0000 mg | INTRAMUSCULAR | Status: AC | PRN
  Administered 2020-07-18: 20 mg

## 2020-07-18 NOTE — Progress Notes (Signed)
Office Visit Note   Patient: Taylor Harvey.           Date of Birth: [birth year]           MRN: [id number] Visit Date: 07/18/2020              Requested by: Taylor Minium, MD [address] [place],  [place] [address] PCP: Taylor Minium, MD   Assessment & Plan: Visit Diagnoses:  1. Trigger finger of left thumb     Plan: Patient's had prior trigger finger releases.  He would like to avoid trigger finger release of the left thumb at this point time.  Advise any use Voltaren gel over the thumb up to 2 g 4 times daily.  Follow-up with [place] as needed.  Would not recommend repeat injection for at least 3 months.  Follow-Up Instructions: Return if symptoms worsen or fail to improve.   Orders:  Orders Placed This Encounter  Procedures  . Hand/UE Inj  . XR Finger Thumb Left   No orders of the defined types were placed in this encounter.     Procedures: Hand/UE Inj for trigger finger on 07/18/2020 10:54 AM Details: 27 G needle, volar approach Medications: 0.5 mL lidocaine 1 %; 20 mg methylPREDNISolone acetate 40 MG/ML      Clinical Data: No additional findings.   Subjective: Chief Complaint  Patient presents with  . Left Thumb - Pain    HPI Taylor Harvey comes in today with left thumb pain.  He states he has had pain for over a month pain is over the joints.  No known injury.  He is taking naproxen and Tylenol.  No other treatment.  He feels that he has arthritis in his thumb.  He denies any triggering of the thumb.   Review of Systems  Constitutional: Negative for chills and fever.  Musculoskeletal: Positive for arthralgias.     Objective: Vital Signs: There were no vitals taken for this visit.  Physical Exam Constitutional:      Appearance: He is not ill-appearing or diaphoretic.  Pulmonary:     Effort: Pulmonary effort is normal.  Neurological:     Mental Status: He is alert.  Psychiatric:        Mood and Affect: Mood normal.     Ortho  Exam Left thumb negative grind test.  No rashes skin lesions ulcerations.  Nontender over the first extensor compartment negative Finkelstein's.  Is a palpable nodule and active triggering of the left thumb over the A1 pulley region.  No other triggering throughout the left hand. Right thumb full range of motion no triggering. Specialty Comments:  No specialty comments available.  Imaging: XR Finger Thumb Left  Result Date: 07/18/2020 Left thumb 3 views: No acute fracture.  Joints overall well-maintained.  CMC joint with some periarticular mild osteophytes.  No subluxation dislocation left thumb.  No bony abnormalities otherwise.    PMFS History: Patient Active Problem List   Diagnosis Date Noted  . GERD (gastroesophageal reflux disease) 04/14/2019  . Status post arthroscopy of left shoulder 10/15/2017  . Lateral epicondylitis, left elbow 04/08/2017  . Chronic left shoulder pain 02/17/2017  . Pain in left elbow 02/17/2017  . Left upper arm pain 01/20/2017  . Insomnia 07/19/2014  . Urinary frequency 07/19/2014  . Physical exam 07/19/2014  . Morbid obesity ([place]) 06/28/2013  . Hyperlipidemia 11/24/2008  . ANXIETY STATE, UNSPECIFIED 06/02/2008  . NEOPLASM, SKIN, UNCERTAIN BEHAVIOR 57/26/2035  .  TRIGGER FINGER 03/02/2008  . BACK PAIN WITH RADICULOPATHY 03/02/2008  . BACK PAIN 07/15/2007  . DYSPNEA ON EXERTION 11/09/2006  . POLYP, COLON 07/19/2006  . Essential hypertension 07/19/2006  . Sleep apnea 07/19/2006  . DIVERTICULOSIS, ASYMPTOMATIC 06/11/2006   Past Medical History:  Diagnosis Date  . Allergy   . Anxiety   . Arthritis   . Chronic kidney disease    hx kidney stones  . Depression   . GERD (gastroesophageal reflux disease)   . Hyperlipemia   . Hypertension   . Sleep apnea 2005   moderate-has cpap-not used in 3 yr    Family History  Problem Relation Age of Onset  . Diabetes Mother   . Pancreatitis Mother   . Heart disease Mother 38       MI, died with MI  .  Stroke Father   . Hypertension Father   . Peripheral vascular disease Father        Carotid surgery  . Heart disease Sister        Arrhythmia  . Diabetes Sister 15       DM  . Kidney cancer Sister   . Diabetes Sister   . Cancer Sister        colon  . Heart disease Sister   . Diabetes Sister   . Heart disease Sister   . Stroke Brother   . Colon cancer Sister   . Pancreatic cancer Other     Past Surgical History:  Procedure Laterality Date  . COLONOSCOPY    . INSERTION OF MESH  05/05/2012   Procedure: INSERTION OF MESH;  Surgeon: Rolm Bookbinder, MD;  Location: [facility];  Service: General;  Laterality: N/A;  . LITHOTRIPSY     x2  . SHOULDER ARTHROSCOPY  07/2017  . trigger finger surgery  2006  . UMBILICAL HERNIA REPAIR  05/05/2012   Procedure: HERNIA REPAIR UMBILICAL ADULT;  Surgeon: Rolm Bookbinder, MD;  Location: [facility];  Service: General;  Laterality: N/A;   Social History   Occupational History  . Occupation: retired  Tobacco Use  . Smoking status: Never Smoker  . Smokeless tobacco: Never Used  Vaping Use  . Vaping Use: Never used  Substance and Sexual Activity  . Alcohol use: No  . Drug use: No  . Sexual activity: Yes    Partners: Female

## 2020-07-20 ENCOUNTER — Telehealth: Payer: Self-pay

## 2020-07-20 NOTE — Chronic Care Management (AMB) (Signed)
    Chronic Care Management Pharmacy Assistant   Name: Taylor Harvey.  MRN: [id number] DOB: [birth year]  Reason for Encounter: Chart Review   PCP : Midge Minium, MD  Allergies:  No Known Allergies  Medications: Outpatient Encounter Medications as of 07/20/2020  Medication Sig  . ADVAIR DISKUS 250-50 MCG/DOSE AEPB INHALE 1 PUFF INTO LUNGS TWICE DAILY  . aspirin 81 MG tablet Take 81 mg by mouth daily.  . citalopram (CELEXA) 20 MG tablet TAKE ONE TABLET BY MOUTH EVERY MORNING  . esomeprazole (NEXIUM) 40 MG capsule TAKE ONE CAPSULE BY MOUTH EVERY MORNING  . meclizine (ANTIVERT) 25 MG tablet TAKE 1 TABLET BY MOUTH THREE TIMES DAILY AS NEEDED FOR DIZZINESS  . meloxicam (MOBIC) 15 MG tablet Take 1 tablet (15 mg total) by mouth daily. (Patient not taking: No sig reported)  . ondansetron (ZOFRAN) 4 MG tablet TAKE 1 TABLET BY MOUTH EVERY 8 HOURS AS NEEDED FOR NAUSEA OR VOMITING  . simvastatin (ZOCOR) 10 MG tablet Take 1 tablet (10 mg total) by mouth daily.  Marland Kitchen telmisartan (MICARDIS) 80 MG tablet TAKE ONE TABLET BY MOUTH EVERY MORNING  . traZODone (DESYREL) 50 MG tablet TAKE ONE-HALF TO 1 TABLET BY MOUTH AT bedtime AS NEEDED FOR SLEEP   No facility-administered encounter medications on file as of 07/20/2020.    Current Diagnosis: Patient Active Problem List   Diagnosis Date Noted  . GERD (gastroesophageal reflux disease) 04/14/2019  . Status post arthroscopy of left shoulder 10/15/2017  . Lateral epicondylitis, left elbow 04/08/2017  . Chronic left shoulder pain 02/17/2017  . Pain in left elbow 02/17/2017  . Left upper arm pain 01/20/2017  . Insomnia 07/19/2014  . Urinary frequency 07/19/2014  . Physical exam 07/19/2014  . Morbid obesity ([place]) 06/28/2013  . Hyperlipidemia 11/24/2008  . ANXIETY STATE, UNSPECIFIED 06/02/2008  . NEOPLASM, SKIN, UNCERTAIN BEHAVIOR 70/62/3762  . TRIGGER FINGER 03/02/2008  . BACK PAIN WITH RADICULOPATHY 03/02/2008  . BACK PAIN 07/15/2007  .  DYSPNEA ON EXERTION 11/09/2006  . POLYP, COLON 07/19/2006  . Essential hypertension 07/19/2006  . Sleep apnea 07/19/2006  . DIVERTICULOSIS, ASYMPTOMATIC 06/11/2006    Reviewed chart for medication changes.  07/18/2020 OV (orthopedic surgery) Dr. Ninfa Linden; Trigger finger of left thumb, cortisone injection given  No medication changes indicated.  Future Appointments  Date Time Provider [place]  12/20/2020  1:30 PM [facility] PHARMACIST [facility]    April D Calhoun, [place] Pharmacist Assistant [phone]   Follow-Up:  Pharmacist Review

## 2020-07-25 ENCOUNTER — Encounter: Payer: Self-pay | Admitting: Family Medicine

## 2020-07-25 ENCOUNTER — Other Ambulatory Visit: Payer: Self-pay | Admitting: Family Medicine

## 2020-07-25 DIAGNOSIS — N200 Calculus of kidney: Secondary | ICD-10-CM

## 2020-07-25 MED ORDER — ADVAIR DISKUS 250-50 MCG/DOSE IN AEPB
1.0000 | INHALATION_SPRAY | Freq: Two times a day (BID) | RESPIRATORY_TRACT | 0 refills | Status: DC
Start: 2020-07-25 — End: 2020-12-25

## 2020-07-25 MED ORDER — HYDROCODONE-ACETAMINOPHEN 5-325 MG PO TABS: 1.0000 | ORAL_TABLET | Freq: Four times a day (QID) | ORAL | 0 refills | Status: DC | PRN

## 2020-08-06 ENCOUNTER — Other Ambulatory Visit: Payer: Self-pay | Admitting: Family Medicine

## 2020-08-06 ENCOUNTER — Telehealth: Payer: Self-pay

## 2020-08-06 DIAGNOSIS — E785 Hyperlipidemia, unspecified: Secondary | ICD-10-CM

## 2020-08-06 NOTE — Chronic Care Management (AMB) (Signed)
    Chronic Care Management Pharmacy Assistant   Name: Taylor Harvey.  MRN: [id number] DOB: 06-15-1950  Reason for Encounter: Medication Review   Recent office visits:  n/a  Recent consult visits:  [facility] visits:  None in previous 6 months  Medications: Outpatient Encounter Medications as of 08/06/2020  Medication Sig  . ADVAIR DISKUS 250-50 MCG/DOSE AEPB Inhale 1 puff into the lungs 2 (two) times daily. INHALE 1 PUFF INTO LUNGS TWICE DAILY  . aspirin 81 MG tablet Take 81 mg by mouth daily.  . citalopram (CELEXA) 20 MG tablet TAKE ONE TABLET BY MOUTH EVERY MORNING  . esomeprazole (NEXIUM) 40 MG capsule TAKE ONE CAPSULE BY MOUTH EVERY MORNING  . HYDROcodone-acetaminophen (NORCO/VICODIN) 5-325 MG tablet Take 1 tablet by mouth every 6 (six) hours as needed for moderate pain.  . meclizine (ANTIVERT) 25 MG tablet TAKE 1 TABLET BY MOUTH THREE TIMES DAILY AS NEEDED FOR DIZZINESS  . meloxicam (MOBIC) 15 MG tablet Take 1 tablet (15 mg total) by mouth daily. (Patient not taking: No sig reported)  . ondansetron (ZOFRAN) 4 MG tablet TAKE 1 TABLET BY MOUTH EVERY 8 HOURS AS NEEDED FOR NAUSEA OR VOMITING  . simvastatin (ZOCOR) 10 MG tablet Take 1 tablet (10 mg total) by mouth daily.  Marland Kitchen telmisartan (MICARDIS) 80 MG tablet TAKE ONE TABLET BY MOUTH EVERY MORNING  . traZODone (DESYREL) 50 MG tablet TAKE ONE-HALF TO 1 TABLET BY MOUTH AT bedtime AS NEEDED FOR SLEEP   No facility-administered encounter medications on file as of 08/06/2020.    Reviewed chart for medication changes ahead of medication coordination call.  No OVs, Consults, or hospital visits since last care coordination call/Pharmacist visit.  No medication changes indicated.  BP Readings from Last 3 Encounters:  05/22/20 (!) 150/80  10/21/19 122/86  10/04/19 (!) 118/56    Lab Results  Component Value Date   HGBA1C 6.3 04/14/2019     Patient obtains medications through Adherence Packaging  90 Days   Last  adherence delivery included:  Citalopram 20 mg; one every morning Esomeprazole 40 mg; one every morning Simvastatin 10 mg; one at bedtome Telmisartan 80 mg; one every morning Trazodone 50 mg; one to one half at bedtime prn  Patient is due for next adherence delivery on: 08/07/2020. Called patient and reviewed medications and coordinated delivery.  This delivery to include: Citalopram 20 mg; one every morning Esomeprazole 40 mg; one every morning Simvastatin 10 mg; one at bedtime Telmisartan 80 mg; one every morning  Patient declined the following medications: Trazodone 50 mg - has an abundance on hand.  Confirmed delivery date of 08/07/2020, advised patient that pharmacy will contact them the morning of delivery.  April D Calhoun, [place] Pharmacist Assistant [phone]

## 2020-08-27 ENCOUNTER — Telehealth: Payer: Self-pay

## 2020-08-27 NOTE — Chronic Care Management (AMB) (Cosign Needed)
    Chronic Care Management Pharmacy Assistant   Name: Taylor Harvey.  MRN: [id number] DOB: 1950-06-17  Reason for Encounter: Medication Review   Recent office visits:  n/a  Recent consult visits:  [facility] visits:  None in previous 6 months  Medications: Outpatient Encounter Medications as of 08/27/2020  Medication Sig   ADVAIR DISKUS 250-50 MCG/DOSE AEPB Inhale 1 puff into the lungs 2 (two) times daily. INHALE 1 PUFF INTO LUNGS TWICE DAILY   aspirin 81 MG tablet Take 81 mg by mouth daily.   citalopram (CELEXA) 20 MG tablet TAKE ONE TABLET BY MOUTH EVERY MORNING   esomeprazole (NEXIUM) 40 MG capsule TAKE ONE CAPSULE BY MOUTH EVERY MORNING   HYDROcodone-acetaminophen (NORCO/VICODIN) 5-325 MG tablet Take 1 tablet by mouth every 6 (six) hours as needed for moderate pain.   meclizine (ANTIVERT) 25 MG tablet TAKE 1 TABLET BY MOUTH THREE TIMES DAILY AS NEEDED FOR DIZZINESS   meloxicam (MOBIC) 15 MG tablet Take 1 tablet (15 mg total) by mouth daily. (Patient not taking: No sig reported)   ondansetron (ZOFRAN) 4 MG tablet TAKE 1 TABLET BY MOUTH EVERY 8 HOURS AS NEEDED FOR NAUSEA OR VOMITING   simvastatin (ZOCOR) 10 MG tablet TAKE ONE TABLET BY MOUTH EVERYDAY AT BEDTIME   telmisartan (MICARDIS) 80 MG tablet TAKE ONE TABLET BY MOUTH EVERY MORNING   traZODone (DESYREL) 50 MG tablet TAKE ONE-HALF TO 1 TABLET BY MOUTH AT bedtime AS NEEDED FOR SLEEP   No facility-administered encounter medications on file as of 08/27/2020.   Reviewed chart for medication changes ahead of medication coordination call.  No OVs, Consults, or hospital visits since last care coordination call/Pharmacist visit. (If appropriate, list visit date, provider name)  No medication changes indicated OR if recent visit, treatment plan here.  BP Readings from Last 3 Encounters:  05/22/20 (!) 150/80  10/21/19 122/86  10/04/19 (!) 118/56    Lab Results  Component Value Date   HGBA1C 6.3 04/14/2019      Patient obtains medications through Adherence Packaging  90 Days   Last adherence delivery included: Citalopram 20 mg; one every morning Esomeprazole 40 mg; one every morning Simvastatin 10 mg; one at bedtime Telmisartan 80 mg; one every morning  Patient is due for next adherence delivery on: 11/02/2020. Called patient and reviewed medications and coordinated delivery.  This delivery to include: Patient will need a short fill of (med), prior to adherence delivery. (To align with sync date or if PRN med)  Coordinated acute fill for (med) to be delivered (date).  Patient declined the following medications (meds) due to (reason)  Patient needs refills for .  Confirmed delivery date of , advised patient that pharmacy will contact them the morning of delivery.   **Unsuccessful attempt to reach patient to complete medication coordination call. Chart review completed.  April D Calhoun, [place] Pharmacist Assistant [phone]

## 2020-09-04 ENCOUNTER — Telehealth: Payer: Self-pay | Admitting: Family Medicine

## 2020-09-04 NOTE — Telephone Encounter (Signed)
Pharmacist not in office, document put at front office tray under pharmacist name.

## 2020-09-04 NOTE — Telephone Encounter (Signed)
Pt's spouse dropped off document for Pharmacist to have and verify, document is also attached with spouse info -Social Security Benefit statement. Document given to pharmacist.

## 2020-09-24 ENCOUNTER — Ambulatory Visit (INDEPENDENT_AMBULATORY_CARE_PROVIDER_SITE_OTHER): Payer: PPO

## 2020-09-24 ENCOUNTER — Ambulatory Visit: Payer: PPO | Admitting: Orthopaedic Surgery

## 2020-09-24 ENCOUNTER — Encounter: Payer: Self-pay | Admitting: Orthopaedic Surgery

## 2020-09-24 DIAGNOSIS — M545 Low back pain, unspecified: Secondary | ICD-10-CM | POA: Diagnosis not present

## 2020-09-24 MED ORDER — METHYLPREDNISOLONE 4 MG PO TABS: ORAL_TABLET | ORAL | 0 refills | Status: DC

## 2020-09-24 MED ORDER — TIZANIDINE HCL 4 MG PO TABS: 4.0000 mg | ORAL_TABLET | Freq: Three times a day (TID) | ORAL | 1 refills | Status: DC | PRN

## 2020-09-24 NOTE — Progress Notes (Signed)
Office Visit Note   Patient: Taylor Harvey.           Date of Birth: 07/20/1950           MRN: [id number] Visit Date: 09/24/2020              Requested by: Midge Minium, MD [address] [place],  [place] [address] PCP: Midge Minium, MD   Assessment & Plan: Visit Diagnoses:  1. Acute bilateral low back pain, unspecified whether sciatica present     Plan: This seems to be more of a musculoskeletal strain to the low back to the right side.  I recommended a combination of topical Voltaren gel, an anti-inflammatory and a steroid taper.  I have also recommended massaging this area.  I showed him exercises he can help.  If his symptoms not improve recommend outpatient physical therapy.  He says he will also look up online some exercises to try.  All question concerns were answered and addressed.  Follow-up can be as needed.  Follow-Up Instructions: Return if symptoms worsen or fail to improve.   Orders:  Orders Placed This Encounter  Procedures  . XR Lumbar Spine 2-3 Views   Meds ordered this encounter  Medications  . methylPREDNISolone (MEDROL) 4 MG tablet    Sig: Medrol dose pack. Take as instructed    Dispense:  21 tablet    Refill:  0  . tiZANidine (ZANAFLEX) 4 MG tablet    Sig: Take 1 tablet (4 mg total) by mouth every 8 (eight) hours as needed for muscle spasms.    Dispense:  40 tablet    Refill:  1      Procedures: No procedures performed   Clinical Data: No additional findings.   Subjective: Chief Complaint  Patient presents with  . Spine - Pain  The patient comes in today with acute right-sided low back pain that started about a week ago.  He does state that is feeling better from when he made the appointment.  He says if he sitting and gets up to standing position he gets a catching sensation in the right side of his low back.  It radiates to the sciatic area but not beyond this.  He denies any change in bowel bladder function.  He denies  any weakness in his legs or numbness and tingling in his feet.  He is now diabetic.  HPI  Review of Systems There is currently listed no headache, chest pain, shortness of breath, fever, chills, nausea, vomiting  Objective: Vital Signs: There were no vitals taken for this visit.  Physical Exam He is alert and orient x3 and in no acute distress Ortho Exam Examination of his bilateral lower extremities shows normal strength in all muscle groups and normal muscle tone with normal sensation as well.  He has pain only to flexion extension of lumbar spine yes to the right side.  He has negative straight leg raise bilaterally. Specialty Comments:  No specialty comments available.  Imaging: XR Lumbar Spine 2-3 Views  Result Date: 09/24/2020 An AP and lateral lumbar spine show no acute findings.  There is a transitional lumbar vertebra.  There are osteophytes anteriorly at multiple levels seen on the lateral view but no significant malalignment.    PMFS History: Patient Active Problem List   Diagnosis Date Noted  . GERD (gastroesophageal reflux disease) 04/14/2019  . Status post arthroscopy of left shoulder 10/15/2017  . Lateral epicondylitis, left elbow  04/08/2017  . Chronic left shoulder pain 02/17/2017  . Pain in left elbow 02/17/2017  . Left upper arm pain 01/20/2017  . Insomnia 07/19/2014  . Urinary frequency 07/19/2014  . Physical exam 07/19/2014  . Morbid obesity ([place]) 06/28/2013  . Hyperlipidemia 11/24/2008  . ANXIETY STATE, UNSPECIFIED 06/02/2008  . NEOPLASM, SKIN, UNCERTAIN BEHAVIOR 62/37/6283  . TRIGGER FINGER 03/02/2008  . BACK PAIN WITH RADICULOPATHY 03/02/2008  . BACK PAIN 07/15/2007  . DYSPNEA ON EXERTION 11/09/2006  . POLYP, COLON 07/19/2006  . Essential hypertension 07/19/2006  . Sleep apnea 07/19/2006  . DIVERTICULOSIS, ASYMPTOMATIC 06/11/2006   Past Medical History:  Diagnosis Date  . Allergy   . Anxiety   . Arthritis   . Chronic kidney disease    hx  kidney stones  . Depression   . GERD (gastroesophageal reflux disease)   . Hyperlipemia   . Hypertension   . Sleep apnea 2005   moderate-has cpap-not used in 3 yr    Family History  Problem Relation Age of Onset  . Diabetes Mother   . Pancreatitis Mother   . Heart disease Mother 69       MI, died with MI  . Stroke Father   . Hypertension Father   . Peripheral vascular disease Father        Carotid surgery  . Heart disease Sister        Arrhythmia  . Diabetes Sister 76       DM  . Kidney cancer Sister   . Diabetes Sister   . Cancer Sister        colon  . Heart disease Sister   . Diabetes Sister   . Heart disease Sister   . Stroke Brother   . Colon cancer Sister   . Pancreatic cancer Other     Past Surgical History:  Procedure Laterality Date  . COLONOSCOPY    . INSERTION OF MESH  05/05/2012   Procedure: INSERTION OF MESH;  Surgeon: Rolm Bookbinder, MD;  Location: [facility];  Service: General;  Laterality: N/A;  . LITHOTRIPSY     x2  . SHOULDER ARTHROSCOPY  07/2017  . trigger finger surgery  2006  . UMBILICAL HERNIA REPAIR  05/05/2012   Procedure: HERNIA REPAIR UMBILICAL ADULT;  Surgeon: Rolm Bookbinder, MD;  Location: [facility];  Service: General;  Laterality: N/A;   Social History   Occupational History  . Occupation: retired  Tobacco Use  . Smoking status: Never Smoker  . Smokeless tobacco: Never Used  Vaping Use  . Vaping Use: Never used  Substance and Sexual Activity  . Alcohol use: No  . Drug use: No  . Sexual activity: Yes    Partners: Female

## 2020-10-05 ENCOUNTER — Other Ambulatory Visit: Payer: Self-pay | Admitting: Family Medicine

## 2020-10-05 MED ORDER — MECLIZINE HCL 25 MG PO TABS: ORAL_TABLET | ORAL | 0 refills | Status: DC

## 2020-10-05 NOTE — Telephone Encounter (Signed)
LFD 05/09/20 #45 with 1 refill LOV 05/30/20 NOV none

## 2020-10-25 ENCOUNTER — Telehealth: Payer: Self-pay

## 2020-10-25 NOTE — Chronic Care Management (AMB) (Cosign Needed)
    Chronic Care Management Pharmacy Assistant   Name: Taylor Harvey.  MRN: [id number] DOB: 08-08-1950  Reason for Encounter: Medication Coordination Call  Recent office visits:  No visits noted  Recent consult visits:  09/24/20- [name], MD (Orthopedics)- seen for acute bilateral low back pain, short course methylprednisolone 4 mg, started tizanidine 4 mg q8h prn, follow up as needed   Hospital visits:  None in previous 6 months  Medications: Outpatient Encounter Medications as of 10/25/2020  Medication Sig  . meclizine (ANTIVERT) 25 MG tablet TAKE 1 TABLET BY MOUTH THREE TIMES DAILY AS NEEDED FOR DIZZINESS  . ADVAIR DISKUS 250-50 MCG/DOSE AEPB Inhale 1 puff into the lungs 2 (two) times daily. INHALE 1 PUFF INTO LUNGS TWICE DAILY  . aspirin 81 MG tablet Take 81 mg by mouth daily.  . citalopram (CELEXA) 20 MG tablet TAKE ONE TABLET BY MOUTH EVERY MORNING  . esomeprazole (NEXIUM) 40 MG capsule TAKE ONE CAPSULE BY MOUTH EVERY MORNING  . HYDROcodone-acetaminophen (NORCO/VICODIN) 5-325 MG tablet Take 1 tablet by mouth every 6 (six) hours as needed for moderate pain.  . meloxicam (MOBIC) 15 MG tablet Take 1 tablet (15 mg total) by mouth daily. (Patient not taking: No sig reported)  . methylPREDNISolone (MEDROL) 4 MG tablet Medrol dose pack. Take as instructed  . ondansetron (ZOFRAN) 4 MG tablet TAKE 1 TABLET BY MOUTH EVERY 8 HOURS AS NEEDED FOR NAUSEA OR VOMITING  . simvastatin (ZOCOR) 10 MG tablet TAKE ONE TABLET BY MOUTH EVERYDAY AT BEDTIME  . telmisartan (MICARDIS) 80 MG tablet TAKE ONE TABLET BY MOUTH EVERY MORNING  . tiZANidine (ZANAFLEX) 4 MG tablet Take 1 tablet (4 mg total) by mouth every 8 (eight) hours as needed for muscle spasms.  . traZODone (DESYREL) 50 MG tablet TAKE ONE-HALF TO 1 TABLET BY MOUTH AT bedtime AS NEEDED FOR SLEEP   No facility-administered encounter medications on file as of 10/25/2020.    Reviewed chart for medication changes ahead of  medication coordination call.  No OVs, Consults, or hospital visits since last care coordination call/Pharmacist visit. (If appropriate, list visit date, provider name)  No medication changes indicated OR if recent visit, treatment plan here.  BP Readings from Last 3 Encounters:  05/22/20 (!) 150/80  10/21/19 122/86  10/04/19 (!) 118/56    Lab Results  Component Value Date   HGBA1C 6.3 04/14/2019    Several unsuccessful attempts made to contact patient for medication coordination   Patient obtains medications through Adherence Packaging  90 Days   Last adherence delivery included:  Citalopram 20 mg; one every morning Esomeprazole 40 mg; one every morning Simvastatin 10 mg; one at bedtime Telmisartan 80 mg; one every morning  Patient is due for next adherence delivery on: Marland Kitchen Called patient and reviewed medications and coordinated delivery.  This delivery to include: Patient will need a short fill of (med), prior to adherence delivery. (To align with sync date or if PRN med)  Coordinated acute fill for (med) to be delivered (date).  Patient declined the following medications (meds) due to (reason)  Patient needs refills for .  Confirmed delivery date of , advised patient that pharmacy will contact them the morning of delivery.  [name] CPA, CMA

## 2020-11-05 ENCOUNTER — Encounter: Payer: Self-pay | Admitting: Orthopaedic Surgery

## 2020-11-05 ENCOUNTER — Ambulatory Visit: Payer: PPO | Admitting: Orthopaedic Surgery

## 2020-11-05 ENCOUNTER — Other Ambulatory Visit: Payer: Self-pay

## 2020-11-05 DIAGNOSIS — G8929 Other chronic pain: Secondary | ICD-10-CM

## 2020-11-05 DIAGNOSIS — M65312 Trigger thumb, left thumb: Secondary | ICD-10-CM | POA: Diagnosis not present

## 2020-11-05 DIAGNOSIS — M5441 Lumbago with sciatica, right side: Secondary | ICD-10-CM | POA: Diagnosis not present

## 2020-11-05 MED ORDER — LIDOCAINE HCL 1 % IJ SOLN
0.5000 mL | INTRAMUSCULAR | Status: AC | PRN
  Administered 2020-11-05: .5 mL

## 2020-11-05 MED ORDER — METHYLPREDNISOLONE 4 MG PO TABS: ORAL_TABLET | ORAL | 0 refills | Status: DC

## 2020-11-05 NOTE — Progress Notes (Signed)
Office Visit Note   Patient: Taylor Harvey.           Date of Birth: [birth year]           MRN: [id number] Visit Date: 11/05/2020              Requested by: Midge Minium, MD [address] [place],  [place] [address] PCP: Midge Minium, MD   Assessment & Plan: Visit Diagnoses:  1. Chronic right-sided low back pain with right-sided sciatica   2. Trigger finger of left thumb     Plan: Per his request I did provide a steroid injection over the A1 pulley of his left thumb.  I will also try a steroid taper 1 more time and will send in outpatient physical therapy for any modalities that can decrease his back pain and improve his back function.  All questions and concerns were answered and addressed.  We will see him back after course of physical therapy.  Follow-Up Instructions: Return in about 6 weeks (around 12/17/2020).   Orders:  Orders Placed This Encounter  Procedures   Hand/UE Inj   Meds ordered this encounter  Medications   methylPREDNISolone (MEDROL) 4 MG tablet    Sig: Medrol dose pack. Take as instructed    Dispense:  21 tablet    Refill:  0      Procedures: Hand/UE Inj: L thumb A1 for trigger finger on 11/05/2020 9:02 AM Medications: 0.5 mL lidocaine 1 %     Clinical Data: No additional findings.   Subjective: Chief Complaint  Patient presents with   Lower Back - Pain  The patient comes in today with flareup of low back pain and right-sided sciatica.  Even his low back pain is the right side.  He gets spasming and catching in his back.  We have seen him for this before.  He is 70 years old.  He has not had any therapy on his back.  He has had a steroid taper in the past and muscle relaxants.  He denies that he goes past his knee or to his foot on the right side.  Is mainly low back.  He also denies any other acute changes in his medical status.  He is moderately obese.  He has been having recurrent triggering of his left thumb as well and is  requesting a steroid injection over the A1 pulley of his left thumb.  We did MRI his lumbar spine in 2017 he did show degenerative changes at multiple levels.  There was an extraforaminal disc at L4-L5 to the right.  There is also facet arthritis at that level.  HPI  Review of Systems There is currently listed no headache, chest pain, shortness of breath, fever, chills, nausea, vomiting  Objective: Vital Signs: There were no vitals taken for this visit.  Physical Exam He is alert and orient x3 and in no acute distress Ortho Exam Examination of his lower extremities shows excellent strength in his bilateral lower extremities.  When he does stand up he gets a catching sensation and pain that is obvious in his low back to the right side.  His hip moves well on that side.  He does have pain with flexion extension and lateral rotation and bending to the right side of the lumbar spine.  There is a sciatic component as well. Specialty Comments:  No specialty comments available.  Imaging: No results found.   PMFS History: Patient Active Problem  List   Diagnosis Date Noted   GERD (gastroesophageal reflux disease) 04/14/2019   Status post arthroscopy of left shoulder 10/15/2017   Lateral epicondylitis, left elbow 04/08/2017   Chronic left shoulder pain 02/17/2017   Pain in left elbow 02/17/2017   Left upper arm pain 01/20/2017   Insomnia 07/19/2014   Urinary frequency 07/19/2014   Physical exam 07/19/2014   Morbid obesity ([place]) 06/28/2013   Hyperlipidemia 11/24/2008   ANXIETY STATE, UNSPECIFIED 06/02/2008   NEOPLASM, SKIN, UNCERTAIN BEHAVIOR 93/71/6967   TRIGGER FINGER 03/02/2008   BACK PAIN WITH RADICULOPATHY 03/02/2008   BACK PAIN 07/15/2007   DYSPNEA ON EXERTION 11/09/2006   POLYP, COLON 07/19/2006   Essential hypertension 07/19/2006   Sleep apnea 07/19/2006   DIVERTICULOSIS, ASYMPTOMATIC 06/11/2006   Past Medical History:  Diagnosis Date   Allergy    Anxiety    Arthritis     Chronic kidney disease    hx kidney stones   Depression    GERD (gastroesophageal reflux disease)    Hyperlipemia    Hypertension    Sleep apnea 2005   moderate-has cpap-not used in 3 yr    Family History  Problem Relation Age of Onset   Diabetes Mother    Pancreatitis Mother    Heart disease Mother 45       MI, died with MI   Stroke Father    Hypertension Father    Peripheral vascular disease Father        Carotid surgery   Heart disease Sister        Arrhythmia   Diabetes Sister 61       DM   Kidney cancer Sister    Diabetes Sister    Cancer Sister        colon   Heart disease Sister    Diabetes Sister    Heart disease Sister    Stroke Brother    Colon cancer Sister    Pancreatic cancer Other     Past Surgical History:  Procedure Laterality Date   COLONOSCOPY     INSERTION OF MESH  05/05/2012   Procedure: INSERTION OF MESH;  Surgeon: Rolm Bookbinder, MD;  Location: [facility];  Service: General;  Laterality: N/A;   LITHOTRIPSY     x2   SHOULDER ARTHROSCOPY  07/2017   trigger finger surgery  8938   UMBILICAL HERNIA REPAIR  05/05/2012   Procedure: HERNIA REPAIR UMBILICAL ADULT;  Surgeon: Rolm Bookbinder, MD;  Location: [facility];  Service: General;  Laterality: N/A;   Social History   Occupational History   Occupation: retired  Tobacco Use   Smoking status: Never   Smokeless tobacco: Never  Vaping Use   Vaping Use: Never used  Substance and Sexual Activity   Alcohol use: No   Drug use: No   Sexual activity: Yes    Partners: Female

## 2020-11-19 ENCOUNTER — Ambulatory Visit: Payer: PPO | Admitting: Physical Therapy

## 2020-11-19 ENCOUNTER — Other Ambulatory Visit: Payer: Self-pay

## 2020-11-19 ENCOUNTER — Encounter: Payer: Self-pay | Admitting: Physical Therapy

## 2020-11-19 DIAGNOSIS — M545 Low back pain, unspecified: Secondary | ICD-10-CM

## 2020-11-19 DIAGNOSIS — R29898 Other symptoms and signs involving the musculoskeletal system: Secondary | ICD-10-CM

## 2020-11-19 NOTE — Patient Instructions (Signed)
Access Code: [email or web address] URL: [email or web address] Date: 11/19/2020 Prepared by: Faustino Congress  Exercises Supine Lower Trunk Rotation - 2 x daily - 7 x weekly - 3 reps - 1 sets - 30 sec hold Hooklying Single Knee to Chest - 2 x daily - 7 x weekly - 3 reps - 1 sets - 30 sec hold Standing Quadratus Lumborum Stretch with Doorway - 2 x daily - 7 x weekly - 3 reps - 1 sets - 30 sec hold  Patient Education Trigger Point Dry Needling

## 2020-11-19 NOTE — Therapy (Signed)
[facility] Physical Therapy [address] [place], [place], [address] Phone: [phone]   Fax:  [phone]  Physical Therapy Evaluation  Patient Details  Name: Taylor Harvey. MRN: [id number] Date of Birth: Sep 24, 1950 Referring Provider (PT): Mcarthur Rossetti, MD   Encounter Date: 11/19/2020   PT End of Session - 11/19/20 0839     Visit Number 1    Number of Visits 12    Date for PT Re-Evaluation 12/31/20    Progress Note Due on Visit 10    PT Start Time 0802    PT Stop Time 0839    PT Time Calculation (min) 37 min    Activity Tolerance Patient tolerated treatment well    Behavior During Therapy [facility] for tasks assessed/performed             Past Medical History:  Diagnosis Date   Allergy    Anxiety    Arthritis    Chronic kidney disease    hx kidney stones   Depression    GERD (gastroesophageal reflux disease)    Hyperlipemia    Hypertension    Sleep apnea 2005   moderate-has cpap-not used in 3 yr    Past Surgical History:  Procedure Laterality Date   COLONOSCOPY     INSERTION OF MESH  05/05/2012   Procedure: INSERTION OF MESH;  Surgeon: Rolm Bookbinder, MD;  Location: [facility];  Service: General;  Laterality: N/A;   LITHOTRIPSY     x2   SHOULDER ARTHROSCOPY  07/2017   trigger finger surgery  [address]   [place]  05/05/2012   Procedure: HERNIA REPAIR UMBILICAL ADULT;  Surgeon: Rolm Bookbinder, MD;  Location: [facility];  Service: General;  Laterality: N/A;    There were no vitals filed for this visit.    Subjective Assessment - 11/19/20 0805     Subjective Pt is a 70 y/o male who presents to OPPT for subacute LBP which started about 2 months ago.  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  Pain is better, but reports occasional episodes of catching.    Pertinent History anxiety, arthritis, depression, HTN, OSA     Patient Stated Goals improve symptoms, no pain    Currently in Pain? Yes    Pain Score 0-No pain   up to 7/10   Pain Location Back    Pain Orientation Right;Lower    Pain Descriptors / Indicators Spasm;Shooting    Pain Type Acute pain    Pain Radiating Towards Rt ant thigh    Pain Onset More than a month ago    Pain Frequency Intermittent    Aggravating Factors  standing after sitting, getting up in AM, descending activities (steps, hills)    Pain Relieving Factors rest, tylenol                [facility] PT Assessment - 11/19/20 0810       Assessment   Medical Diagnosis M54.41,G89.29 (ICD-10-CM) - Chronic right-sided low back pain with right-sided sciatica    Referring Provider (PT) Mcarthur Rossetti, MD    Onset Date/Surgical Date --   April 2022   Hand Dominance Right    Next MD Visit 12/17/20 (with wrong MD, will need to correct)    Prior Therapy for back about 6 years ago      Precautions   Precautions None      Restrictions  Weight Bearing Restrictions No      Balance Screen   Has the patient fallen in the past 6 months No    Has the patient had a decrease in activity level because of a fear of falling?  No    Is the patient reluctant to leave their home because of a fear of falling?  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    Leisure watch movies, walk dog, no regular exercise      Cognition   Overall Cognitive Status Within Functional Limits for  tasks assessed      Observation/Other Assessments   Focus on Therapeutic Outcomes (FOTO)  53 (predicted 67)      Posture/Postural Control   Posture/Postural Control Postural limitations    Postural Limitations Rounded Shoulders;Forward head      ROM / Strength   AROM / PROM / Strength AROM;Strength      AROM   AROM Assessment Site Lumbar    Lumbar Flexion WNL    Lumbar Extension WNL - decreased pain with repetition    Lumbar - Right Side Bend WNL    Lumbar - Left Side Bend WNL with Rt sided symptoms    Lumbar - Right Rotation WNL    Lumbar - Left Rotation WNL      Strength   Overall Strength Comments grossly 5/5 bil hip/knees    Strength Assessment Site Hip;Knee      Palpation   Palpation comment trigger points noted in Rt QL and glute med      Special Tests    Special Tests Lumbar    Lumbar Tests Straight Leg Raise      Straight Leg Raise   Findings Negative      Ambulation/Gait   Gait Comments no significant abnormalities noted                        Objective measurements completed on examination: See above findings.       [facility] Adult PT Treatment/Exercise - 11/19/20 0810       Exercises   Exercises Other Exercises    Other Exercises  verbally reviewed HEP with pt - did not perform so will review next session      Manual Therapy   Manual Therapy Soft tissue mobilization    Soft tissue mobilization Rt QL and glute med with compression              Trigger Point Dry Needling - 11/19/20 0840     Consent Given? Yes    Education Handout Provided Yes    Muscles Treated Back/Hip Gluteus medius;Quadratus lumborum    Gluteus Medius Response Twitch response elicited    Quadratus Lumborum Response Twitch response elicited                  PT Education - 11/19/20 0839     Education  Details HEP, DN    Person(s) Educated Patient    Methods Explanation;Demonstration;Handout    Comprehension Returned demonstration;Verbalized  understanding;Need further instruction              PT Short Term Goals - 11/19/20 1129       PT SHORT TERM GOAL #1   Title independent with initial HEP    Time 3    Period Weeks    Status New    Target Date 12/10/20               PT Long Term Goals - 11/19/20 1129       PT LONG TERM GOAL #1   Title independent with final HEP    Time 6    Period Weeks    Status New    Target Date 12/31/20      PT LONG TERM GOAL #2   Title FOTO score improved to 9 for improved function    Time 6    Period Weeks    Status New    Target Date 12/31/20      PT LONG TERM GOAL #3   Title perform lumbar ROM without increase in symptoms for improved function    Time 6    Period Weeks    Status New    Target Date 12/31/20      PT LONG TERM GOAL #4   Title report pain < 3/10 with transitional movements for improved function and mobility    Time 6    Period Weeks    Status New    Target Date 12/31/20                    Plan - 11/19/20 1126     Clinical Impression Statement Pt is a 70 y/o male who presents to OPPT for subacute Rt sided LBP with radiculopathy.  He demonstrates increased pain with decreased flexibility, mild strength deficits and trigger points noted.  He will benefit from PT to address deficits listed.    Personal Factors and Comorbidities Comorbidity 3+    Comorbidities anxiety, arthritis, depression, HTN, OSA    Examination-Activity Limitations Sit;Bed Mobility;Bend;Carry;Stand;Lift;Locomotion Level;Transfers    Examination-Participation Restrictions Community Activity;Occupation    Stability/Clinical Decision Making Evolving/Moderate complexity    Clinical Decision Making Moderate    Rehab Potential Good    PT Frequency 2x / week   1-2x/wk   PT Duration 6 weeks    PT Treatment/Interventions ADLs/Self Care Home Management;Cryotherapy;Electrical Stimulation;Moist Heat;Traction;Therapeutic exercise;Therapeutic activities;Functional mobility  training;Gait training;Neuromuscular re-education;Patient/family education;Manual techniques;Taping;Passive range of motion;Dry needling;Spinal Manipulations    PT Next Visit Plan assess response to DN and manual therapy, review HEP and updated PRN    PT Home Exercise Plan Access Code: [email or web address]    Consulted and Agree with Plan of Care Patient             Patient will benefit from skilled therapeutic intervention in order to improve the following deficits and impairments:  Increased fascial restricitons, Increased muscle spasms, Pain, Decreased mobility, Impaired flexibility, Postural dysfunction, Decreased strength  Visit Diagnosis: Right-sided low back pain without sciatica, unspecified chronicity - Plan: PT plan of care cert/re-cert  Other symptoms and signs involving the musculoskeletal system - Plan: PT plan of care cert/re-cert     Problem List Patient Active Problem List   Diagnosis Date Noted   GERD (gastroesophageal reflux disease) 04/14/2019   Status post arthroscopy of left shoulder 10/15/2017   Lateral epicondylitis, left elbow 04/08/2017  Chronic left shoulder pain 02/17/2017   Pain in left elbow 02/17/2017   Left upper arm pain 01/20/2017   Insomnia 07/19/2014   Urinary frequency 07/19/2014   Physical exam 07/19/2014   Morbid obesity ([place]) 06/28/2013   Hyperlipidemia 11/24/2008   ANXIETY STATE, UNSPECIFIED 06/02/2008   NEOPLASM, SKIN, UNCERTAIN BEHAVIOR 09/62/8366   TRIGGER FINGER 03/02/2008   BACK PAIN WITH RADICULOPATHY 03/02/2008   BACK PAIN 07/15/2007   DYSPNEA ON EXERTION 11/09/2006   POLYP, COLON 07/19/2006   Essential hypertension 07/19/2006   Sleep apnea 07/19/2006   DIVERTICULOSIS, ASYMPTOMATIC 06/11/2006      Laureen Abrahams, PT, DPT 11/19/20 11:33 AM    [facility] [address] [place], [place], [address] Phone: [phone]   Fax:  [phone]  Name: Taylor Harvey. MRN: [id number] Date  of Birth: June 28, 1950

## 2020-11-21 ENCOUNTER — Encounter: Payer: Self-pay | Admitting: *Deleted

## 2020-11-22 ENCOUNTER — Telehealth: Payer: Self-pay

## 2020-11-22 NOTE — Chronic Care Management (AMB) (Signed)
    Chronic Care Management Pharmacy Assistant   Name: Taylor Harvey.  MRN: [id number] DOB: 1950-12-14  Reason for Encounter: Chart Review  Medications: Outpatient Encounter Medications as of 11/22/2020  Medication Sig   ADVAIR DISKUS 250-50 MCG/DOSE AEPB Inhale 1 puff into the lungs 2 (two) times daily. INHALE 1 PUFF INTO LUNGS TWICE DAILY   aspirin 81 MG tablet Take 81 mg by mouth daily.   citalopram (CELEXA) 20 MG tablet TAKE ONE TABLET BY MOUTH EVERY MORNING   esomeprazole (NEXIUM) 40 MG capsule TAKE ONE CAPSULE BY MOUTH EVERY MORNING   HYDROcodone-acetaminophen (NORCO/VICODIN) 5-325 MG tablet Take 1 tablet by mouth every 6 (six) hours as needed for moderate pain.   meclizine (ANTIVERT) 25 MG tablet TAKE 1 TABLET BY MOUTH THREE TIMES DAILY AS NEEDED FOR DIZZINESS   meloxicam (MOBIC) 15 MG tablet Take 1 tablet (15 mg total) by mouth daily. (Patient not taking: No sig reported)   methylPREDNISolone (MEDROL) 4 MG tablet Medrol dose pack. Take as instructed   ondansetron (ZOFRAN) 4 MG tablet TAKE 1 TABLET BY MOUTH EVERY 8 HOURS AS NEEDED FOR NAUSEA OR VOMITING   simvastatin (ZOCOR) 10 MG tablet TAKE ONE TABLET BY MOUTH EVERYDAY AT BEDTIME   telmisartan (MICARDIS) 80 MG tablet TAKE ONE TABLET BY MOUTH EVERY MORNING   tiZANidine (ZANAFLEX) 4 MG tablet Take 1 tablet (4 mg total) by mouth every 8 (eight) hours as needed for muscle spasms.   traZODone (DESYREL) 50 MG tablet TAKE ONE-HALF TO 1 TABLET BY MOUTH AT bedtime AS NEEDED FOR SLEEP   No facility-administered encounter medications on file as of 11/22/2020.   Reviewed chart for medication changes and adherence.   Recent OV, Consult or Hospital visit:  11/05/20- [name], MD (Orthopedics)- seen for Chronic right-sided low back pain with right-sided sciatica  Recent medication changes indicated:  11/05/20- Methylprednisolone 4 mg as directed  No gaps in adherence identified. Patient has follow up scheduled with  pharmacy team. No further action required.  [name] CPA, CMA

## 2020-11-23 ENCOUNTER — Telehealth: Payer: Self-pay

## 2020-11-23 NOTE — Chronic Care Management (AMB) (Signed)
    Chronic Care Management Pharmacy Assistant   Name: Taylor Harvey.  MRN: [id number] DOB: Jul 17, 1950  Reason for Encounter: Medication Coordination Call  Recent office visits:  No visits noted  Recent consult visits:  11/05/20- [name], MD (Orthopedics)- seen for Chronic right-sided low back pain with right-sided sciatica, Short course Methylprednisolone 4 mg as directed, follow up 6 weeks  Hospital visits:  None in previous 6 months  Medications: Outpatient Encounter Medications as of 11/23/2020  Medication Sig   ADVAIR DISKUS 250-50 MCG/DOSE AEPB Inhale 1 puff into the lungs 2 (two) times daily. INHALE 1 PUFF INTO LUNGS TWICE DAILY   aspirin 81 MG tablet Take 81 mg by mouth daily.   citalopram (CELEXA) 20 MG tablet TAKE ONE TABLET BY MOUTH EVERY MORNING   esomeprazole (NEXIUM) 40 MG capsule TAKE ONE CAPSULE BY MOUTH EVERY MORNING   HYDROcodone-acetaminophen (NORCO/VICODIN) 5-325 MG tablet Take 1 tablet by mouth every 6 (six) hours as needed for moderate pain.   meclizine (ANTIVERT) 25 MG tablet TAKE 1 TABLET BY MOUTH THREE TIMES DAILY AS NEEDED FOR DIZZINESS   meloxicam (MOBIC) 15 MG tablet Take 1 tablet (15 mg total) by mouth daily. (Patient not taking: No sig reported)   methylPREDNISolone (MEDROL) 4 MG tablet Medrol dose pack. Take as instructed   ondansetron (ZOFRAN) 4 MG tablet TAKE 1 TABLET BY MOUTH EVERY 8 HOURS AS NEEDED FOR NAUSEA OR VOMITING   simvastatin (ZOCOR) 10 MG tablet TAKE ONE TABLET BY MOUTH EVERYDAY AT BEDTIME   telmisartan (MICARDIS) 80 MG tablet TAKE ONE TABLET BY MOUTH EVERY MORNING   tiZANidine (ZANAFLEX) 4 MG tablet Take 1 tablet (4 mg total) by mouth every 8 (eight) hours as needed for muscle spasms.   traZODone (DESYREL) 50 MG tablet TAKE ONE-HALF TO 1 TABLET BY MOUTH AT bedtime AS NEEDED FOR SLEEP   No facility-administered encounter medications on file as of 11/23/2020.    Reviewed chart for medication changes ahead of medication  coordination call.  No OVs, Consults, or hospital visits since last care coordination call/Pharmacist visit. (If appropriate, list visit date, provider name)  No medication changes indicated OR if recent visit, treatment plan here.  BP Readings from Last 3 Encounters:  05/22/20 (!) 150/80  10/21/19 122/86  10/04/19 (!) 118/56    Lab Results  Component Value Date   HGBA1C 6.3 04/14/2019     Patient obtains medications through Adherence Packaging  90 Days   Last adherence delivery included:  Citalopram 20 mg; one every morning Esomeprazole 40 mg; one every morning Simvastatin 10 mg; one at bedtime Telmisartan 80 mg; one every morning  Patient is due for next adherence delivery on: 12/05/2020 Called patient and reviewed medications and coordinated delivery.  This delivery to include: Citalopram 20 mg; one every morning Esomeprazole 40 mg; one every morning Simvastatin 10 mg; one at bedtime Telmisartan 80 mg; one every morning Advair 250-50 mcg  Confirmed delivery date of 12/05/2020, advised patient that pharmacy will contact them the morning of delivery.  [name] CPA, CMA

## 2020-12-03 ENCOUNTER — Encounter: Payer: Self-pay | Admitting: Physical Therapy

## 2020-12-03 ENCOUNTER — Other Ambulatory Visit: Payer: Self-pay

## 2020-12-03 ENCOUNTER — Ambulatory Visit (INDEPENDENT_AMBULATORY_CARE_PROVIDER_SITE_OTHER): Payer: PPO | Admitting: Physical Therapy

## 2020-12-03 DIAGNOSIS — M545 Low back pain, unspecified: Secondary | ICD-10-CM | POA: Diagnosis not present

## 2020-12-03 DIAGNOSIS — R29898 Other symptoms and signs involving the musculoskeletal system: Secondary | ICD-10-CM | POA: Diagnosis not present

## 2020-12-03 NOTE — Therapy (Signed)
[facility] [address] [place], [place], [address] Phone: [phone]   Fax:  [phone]  Physical Therapy Treatment  Patient Details  Name: Taylor Harvey. MRN: [id number] Date of Birth: 04/30/1951 Referring Provider (PT): Mcarthur Rossetti, MD   Encounter Date: 12/03/2020   PT End of Session - 12/03/20 0926     Visit Number 2    Number of Visits 12    Date for PT Re-Evaluation 12/31/20    Progress Note Due on Visit 10    PT Start Time 0848    PT Stop Time 0926    PT Time Calculation (min) 38 min    Activity Tolerance Patient tolerated treatment well    Behavior During Therapy [facility] for tasks assessed/performed             Past Medical History:  Diagnosis Date   Allergy    Anxiety    Arthritis    Chronic kidney disease    hx kidney stones   Depression    GERD (gastroesophageal reflux disease)    Hyperlipemia    Hypertension    Sleep apnea 2005   moderate-has cpap-not used in 3 yr    Past Surgical History:  Procedure Laterality Date   COLONOSCOPY     INSERTION OF MESH  05/05/2012   Procedure: INSERTION OF MESH;  Surgeon: Rolm Bookbinder, MD;  Location: [place];  Service: General;  Laterality: N/A;   LITHOTRIPSY     x2   SHOULDER ARTHROSCOPY  07/2017   trigger finger surgery  3825   UMBILICAL HERNIA REPAIR  05/05/2012   Procedure: HERNIA REPAIR UMBILICAL ADULT;  Surgeon: Rolm Bookbinder, MD;  Location: [place];  Service: General;  Laterality: N/A;    There were no vitals filed for this visit.   Subjective Assessment - 12/03/20 0851     Subjective feels pain is getting better, reports only 1 episode of "locking up" which lasted less than a minute    Pertinent History anxiety, arthritis, depression, HTN, OSA    Patient Stated Goals improve symptoms, no pain    Currently in Pain? No/denies                               [facility] Adult PT  Treatment/Exercise - 12/03/20 0852       Exercises   Exercises Lumbar      Lumbar Exercises: Stretches   Single Knee to Chest Stretch Right;Left;3 reps;30 seconds    Lower Trunk Rotation 3 reps;30 seconds    Other Lumbar Stretch Exercise Rt QL stretch in doorway 3x30 sec      Lumbar Exercises: Aerobic   Nustep L6 x 8 min      Lumbar Exercises: Supine   Bridge 10 reps;5 seconds    Bridge Limitations x 10 reps with isometric hip abduction    Other Supine Lumbar Exercises isometric hip ext on Rt 10 x 5 sec hold      Lumbar Exercises: Prone   Straight Leg Raise 10 reps;2 seconds   Rt side only     Manual Therapy   Manual Therapy Soft tissue mobilization    Soft tissue mobilization Rt glute med with compression              Trigger Point Dry Needling - 12/03/20 0923     Consent Given? Yes    Muscles Treated Back/Hip Gluteus medius  Gluteus Medius Response Twitch response elicited                    PT Short Term Goals - 12/03/20 0926       PT SHORT TERM GOAL #1   Title independent with initial HEP    Baseline 7/11: mod cues needed    Time 3    Period Weeks    Status On-going    Target Date 12/10/20               PT Long Term Goals - 11/19/20 1129       PT LONG TERM GOAL #1   Title independent with final HEP    Time 6    Period Weeks    Status New    Target Date 12/31/20      PT LONG TERM GOAL #2   Title FOTO score improved to 67 for improved function    Time 6    Period Weeks    Status New    Target Date 12/31/20      PT LONG TERM GOAL #3   Title perform lumbar ROM without increase in symptoms for improved function    Time 6    Period Weeks    Status New    Target Date 12/31/20      PT LONG TERM GOAL #4   Title report pain < 3/10 with transitional movements for improved function and mobility    Time 6    Period Weeks    Status New    Target Date 12/31/20                   Plan - 12/03/20 0926     Clinical  Impression Statement Mod cues needed for HEP review today, and reports overall good compliance but not consistent every day.  No new exercies provided today to work on consistent compliance for now.  Overall pain continues to improve with reduction noted today following DN and manual therapy.  Incorporated SI stab exercises today as well with good tolerance.  Will continue to beneifit from PT to maximize function.    Personal Factors and Comorbidities Comorbidity 3+    Comorbidities anxiety, arthritis, depression, HTN, OSA    Examination-Activity Limitations Sit;Bed Mobility;Bend;Carry;Stand;Lift;Locomotion Level;Transfers    Examination-Participation Restrictions Community Activity;Occupation    Stability/Clinical Decision Making Evolving/Moderate complexity    Rehab Potential Good    PT Frequency 2x / week   1-2x/wk   PT Duration 6 weeks    PT Treatment/Interventions ADLs/Self Care Home Management;Cryotherapy;Electrical Stimulation;Moist Heat;Traction;Therapeutic exercise;Therapeutic activities;Functional mobility training;Gait training;Neuromuscular re-education;Patient/family education;Manual techniques;Taping;Passive range of motion;Dry needling;Spinal Manipulations    PT Next Visit Plan assess response to DN and manual therapy, review HEP and updated PRN    PT Home Exercise Plan Access Code: [email or web address]    Consulted and Agree with Plan of Care Patient             Patient will benefit from skilled therapeutic intervention in order to improve the following deficits and impairments:  Increased fascial restricitons, Increased muscle spasms, Pain, Decreased mobility, Impaired flexibility, Postural dysfunction, Decreased strength  Visit Diagnosis: Right-sided low back pain without sciatica, unspecified chronicity  Other symptoms and signs involving the musculoskeletal system     Problem List Patient Active Problem List   Diagnosis Date Noted   GERD (gastroesophageal reflux disease)  04/14/2019   Status post arthroscopy of left shoulder 10/15/2017   Lateral epicondylitis, left elbow 04/08/2017  Chronic left shoulder pain 02/17/2017   Pain in left elbow 02/17/2017   Left upper arm pain 01/20/2017   Insomnia 07/19/2014   Urinary frequency 07/19/2014   Physical exam 07/19/2014   Morbid obesity ([place]) 06/28/2013   Hyperlipidemia 11/24/2008   ANXIETY STATE, UNSPECIFIED 06/02/2008   NEOPLASM, SKIN, UNCERTAIN BEHAVIOR 88/91/6945   TRIGGER FINGER 03/02/2008   BACK PAIN WITH RADICULOPATHY 03/02/2008   BACK PAIN 07/15/2007   DYSPNEA ON EXERTION 11/09/2006   POLYP, COLON 07/19/2006   Essential hypertension 07/19/2006   Sleep apnea 07/19/2006   DIVERTICULOSIS, ASYMPTOMATIC 06/11/2006      Laureen Abrahams, PT, DPT 12/03/20 9:29 AM      [facility] [address] [place], [place], [address] Phone: [phone]   Fax:  [phone]  Name: [place]. MRN: [id number] Date of Birth: January 31, 1951

## 2020-12-10 ENCOUNTER — Other Ambulatory Visit: Payer: Self-pay

## 2020-12-10 ENCOUNTER — Encounter: Payer: Self-pay | Admitting: Rehabilitative and Restorative Service Providers"

## 2020-12-10 ENCOUNTER — Ambulatory Visit (INDEPENDENT_AMBULATORY_CARE_PROVIDER_SITE_OTHER): Payer: PPO | Admitting: Rehabilitative and Restorative Service Providers"

## 2020-12-10 DIAGNOSIS — R29898 Other symptoms and signs involving the musculoskeletal system: Secondary | ICD-10-CM | POA: Diagnosis not present

## 2020-12-10 DIAGNOSIS — M545 Low back pain, unspecified: Secondary | ICD-10-CM | POA: Diagnosis not present

## 2020-12-10 NOTE — Therapy (Signed)
[facility] [address] [place], [place], [address] Phone: [phone]   Fax:  [phone]  Physical Therapy Treatment  Patient Details  Name: Taylor Harvey. MRN: [id number] Date of Birth: [birth year] Referring Provider (PT): Mcarthur Rossetti, MD   Encounter Date: 12/10/2020   PT End of Session - 12/10/20 1156     Visit Number 3    Number of Visits 12    Date for PT Re-Evaluation 12/31/20    Progress Note Due on Visit 10    PT Start Time 2094    PT Stop Time 1222    PT Time Calculation (min) 38 min    Activity Tolerance Patient tolerated treatment well    Behavior During Therapy [facility] for tasks assessed/performed             Past Medical History:  Diagnosis Date   Allergy    Anxiety    Arthritis    Chronic kidney disease    hx kidney stones   Depression    GERD (gastroesophageal reflux disease)    Hyperlipemia    Hypertension    Sleep apnea 2005   moderate-has cpap-not used in 3 yr    Past Surgical History:  Procedure Laterality Date   COLONOSCOPY     INSERTION OF MESH  05/05/2012   Procedure: INSERTION OF MESH;  Surgeon: Rolm Bookbinder, MD;  Location: [place];  Service: General;  Laterality: N/A;   LITHOTRIPSY     x2   SHOULDER ARTHROSCOPY  07/2017   trigger finger surgery  7096   [place]  05/05/2012   Procedure: HERNIA REPAIR UMBILICAL ADULT;  Surgeon: Rolm Bookbinder, MD;  Location: [place];  Service: General;  Laterality: N/A;    There were no vitals filed for this visit.   Subjective Assessment - 12/10/20 1152     Subjective Pt. indicated still feeling better overall.  Catching can still occur (reported noted in reaching overhead).  Pt. deferred dry needling today (indicated just feeling better overall).    Pertinent History anxiety, arthritis, depression, HTN, OSA    Patient Stated Goals improve symptoms, no pain    Currently in Pain?  No/denies    Pain Score 0-No pain                [facility] PT Assessment - 12/10/20 0001       Assessment   Medical Diagnosis M54.41,G89.29 (ICD-10-CM) - Chronic right-sided low back pain with right-sided sciatica    Referring Provider (PT) Mcarthur Rossetti, MD                           [facility] Adult PT Treatment/Exercise - 12/10/20 0001       Lumbar Exercises: Stretches   Single Knee to Chest Stretch 30 seconds;3 reps;Left;Right   contralateral leg straight   Lower Trunk Rotation --   Reviewed verball for home use   Other Lumbar Stretch Exercise Verbal review of doorway QL stretch    Other Lumbar Stretch Exercise sidelying regional rotation lumbar stretch 15 sec x 3 bilateral      Lumbar Exercises: Aerobic   Nustep Lvl 6 10 mins UE/LE      Lumbar Exercises: Supine   Bridge 5 seconds;15 reps    Other Supine Lumbar Exercises isometric hip ext on bilateral 10 x 5 sec hold (leg extended)      Lumbar Exercises: Prone   Straight Leg Raise  10 reps   bilateral     Manual Therapy   Manual therapy comments Lt sidelying regional rotation mobilization g4 to Rt lumbar                      PT Short Term Goals - 12/03/20 0926       PT SHORT TERM GOAL #1   Title independent with initial HEP    Baseline 7/11: mod cues needed    Time 3    Period Weeks    Status On-going    Target Date 12/10/20               PT Long Term Goals - 11/19/20 1129       PT LONG TERM GOAL #1   Title independent with final HEP    Time 6    Period Weeks    Status New    Target Date 12/31/20      PT LONG TERM GOAL #2   Title FOTO score improved to 67 for improved function    Time 6    Period Weeks    Status New    Target Date 12/31/20      PT LONG TERM GOAL #3   Title perform lumbar ROM without increase in symptoms for improved function    Time 6    Period Weeks    Status New    Target Date 12/31/20      PT LONG TERM GOAL #4   Title report pain <  3/10 with transitional movements for improved function and mobility    Time 6    Period Weeks    Status New    Target Date 12/31/20                   Plan - 12/10/20 1206     Clinical Impression Statement Pt. reported continued reduction of overall symptoms c instances of quick pain complaints noted.  Due to improvements, Pt. indicated deferrment of needling today.  Performance of manual intervention and ther ex focused on improved lumbar mobility gains c good tolerance today.  Will plan for reassessment of needling use in future prn.    Personal Factors and Comorbidities Comorbidity 3+    Comorbidities anxiety, arthritis, depression, HTN, OSA    Examination-Activity Limitations Sit;Bed Mobility;Bend;Carry;Stand;Lift;Locomotion Level;Transfers    Examination-Participation Restrictions Community Activity;Occupation    Stability/Clinical Decision Making Evolving/Moderate complexity    Rehab Potential Good    PT Frequency 2x / week   1-2x/wk   PT Duration 6 weeks    PT Treatment/Interventions ADLs/Self Care Home Management;Cryotherapy;Electrical Stimulation;Moist Heat;Traction;Therapeutic exercise;Therapeutic activities;Functional mobility training;Gait training;Neuromuscular re-education;Patient/family education;Manual techniques;Taping;Passive range of motion;Dry needling;Spinal Manipulations    PT Next Visit Plan Needling prn, continued gains in lumbar mobility.    PT Home Exercise Plan Access Code: [email or web address]    Consulted and Agree with Plan of Care Patient             Patient will benefit from skilled therapeutic intervention in order to improve the following deficits and impairments:  Increased fascial restricitons, Increased muscle spasms, Pain, Decreased mobility, Impaired flexibility, Postural dysfunction, Decreased strength  Visit Diagnosis: Right-sided low back pain without sciatica, unspecified chronicity  Other symptoms and signs involving the musculoskeletal  system     Problem List Patient Active Problem List   Diagnosis Date Noted   GERD (gastroesophageal reflux disease) 04/14/2019   Status post arthroscopy of left shoulder 10/15/2017   Lateral epicondylitis, left elbow  04/08/2017   Chronic left shoulder pain 02/17/2017   Pain in left elbow 02/17/2017   Left upper arm pain 01/20/2017   Insomnia 07/19/2014   Urinary frequency 07/19/2014   Physical exam 07/19/2014   Morbid obesity ([place]) 06/28/2013   Hyperlipidemia 11/24/2008   ANXIETY STATE, UNSPECIFIED 06/02/2008   NEOPLASM, SKIN, UNCERTAIN BEHAVIOR 82/64/1583   TRIGGER FINGER 03/02/2008   BACK PAIN WITH RADICULOPATHY 03/02/2008   BACK PAIN 07/15/2007   DYSPNEA ON EXERTION 11/09/2006   POLYP, COLON 07/19/2006   Essential hypertension 07/19/2006   Sleep apnea 07/19/2006   DIVERTICULOSIS, ASYMPTOMATIC 06/11/2006   Scot Jun, PT, DPT, OCS, ATC 12/10/20  12:10 PM    [facility] [address] [place], [place], [address] Phone: [phone]   Fax:  [phone]  Name: Kinnick Maus. MRN: [id number] Date of Birth: [birth year]

## 2020-12-12 ENCOUNTER — Other Ambulatory Visit: Payer: Self-pay | Admitting: Family Medicine

## 2020-12-12 NOTE — Telephone Encounter (Signed)
Last filled 05/09/20 #20 with no refills  Last visit 05/30/20 Next visit none

## 2020-12-17 ENCOUNTER — Ambulatory Visit: Payer: PPO | Admitting: Orthopedic Surgery

## 2020-12-17 ENCOUNTER — Telehealth: Payer: Self-pay | Admitting: Orthopaedic Surgery

## 2020-12-17 ENCOUNTER — Encounter: Payer: PPO | Admitting: Physical Therapy

## 2020-12-17 ENCOUNTER — Ambulatory Visit: Payer: PPO | Admitting: Orthopaedic Surgery

## 2020-12-17 NOTE — Telephone Encounter (Signed)
Pt called stating he's been doing PT and was told to CB if it wasn't working. PT called stating PT isn't helping and he would like to proceed with getting the injection Dr.Blackman discussed with him. Pt would like a CB to discuss further.   [phone]

## 2020-12-18 ENCOUNTER — Telehealth: Payer: Self-pay

## 2020-12-18 DIAGNOSIS — L570 Actinic keratosis: Secondary | ICD-10-CM | POA: Diagnosis not present

## 2020-12-18 DIAGNOSIS — L578 Other skin changes due to chronic exposure to nonionizing radiation: Secondary | ICD-10-CM | POA: Diagnosis not present

## 2020-12-18 DIAGNOSIS — C44311 Basal cell carcinoma of skin of nose: Secondary | ICD-10-CM | POA: Diagnosis not present

## 2020-12-18 NOTE — Chronic Care Management (AMB) (Signed)
    Chronic Care Management Pharmacy Assistant   Name: Taylor Harvey.  MRN: [id number] DOB: 01-01-1951   Reason for Encounter: Disease State    Recent office visits:  None  Recent consult visits:  None  Hospital visits:  None in previous 6 months  Medications: Outpatient Encounter Medications as of 12/18/2020  Medication Sig   ondansetron (ZOFRAN) 4 MG tablet TAKE 1 TABLET BY MOUTH EVERY 8 HOURS AS NEEDED FOR NAUSEA OR VOMITING   ADVAIR DISKUS 250-50 MCG/DOSE AEPB Inhale 1 puff into the lungs 2 (two) times daily. INHALE 1 PUFF INTO LUNGS TWICE DAILY   aspirin 81 MG tablet Take 81 mg by mouth daily.   citalopram (CELEXA) 20 MG tablet TAKE ONE TABLET BY MOUTH EVERY MORNING   esomeprazole (NEXIUM) 40 MG capsule TAKE ONE CAPSULE BY MOUTH EVERY MORNING   HYDROcodone-acetaminophen (NORCO/VICODIN) 5-325 MG tablet Take 1 tablet by mouth every 6 (six) hours as needed for moderate pain.   meclizine (ANTIVERT) 25 MG tablet TAKE 1 TABLET BY MOUTH THREE TIMES DAILY AS NEEDED FOR DIZZINESS   meloxicam (MOBIC) 15 MG tablet Take 1 tablet (15 mg total) by mouth daily. (Patient not taking: No sig reported)   methylPREDNISolone (MEDROL) 4 MG tablet Medrol dose pack. Take as instructed   simvastatin (ZOCOR) 10 MG tablet TAKE ONE TABLET BY MOUTH EVERYDAY AT BEDTIME   telmisartan (MICARDIS) 80 MG tablet TAKE ONE TABLET BY MOUTH EVERY MORNING   tiZANidine (ZANAFLEX) 4 MG tablet Take 1 tablet (4 mg total) by mouth every 8 (eight) hours as needed for muscle spasms.   traZODone (DESYREL) 50 MG tablet TAKE ONE-HALF TO 1 TABLET BY MOUTH AT bedtime AS NEEDED FOR SLEEP   No facility-administered encounter medications on file as of 12/18/2020.    I called and spoke with Lajean Saver. He stated he does not have any concerns for his health at this time. Patient denied having any issues or side effects from any of his medications. Patient states he did not have any suggestions for [place] to help with his  overall care.  Patient rescheduled his follow up telephone appointment with clinical pharmacist for 12/19/2020 at 1:30 pm.  Future Appointments  Date Time Provider [place]  12/19/2020  1:30 PM [facility] PHARMACIST [facility]  12/24/2020  8:45 AM Laureen Abrahams, PT [facility] None  12/31/2020  3:00 PM Mcarthur Rossetti, MD [facility] None    Star Rating Drugs: Simvastatin 10 mg last filled 11/29/2020 90 DS Telmisartan 80 mg last filled 11/29/2020 90 DS  April D Calhoun, [place] Pharmacist Assistant [phone]

## 2020-12-19 ENCOUNTER — Ambulatory Visit (INDEPENDENT_AMBULATORY_CARE_PROVIDER_SITE_OTHER): Payer: PPO

## 2020-12-19 ENCOUNTER — Other Ambulatory Visit: Payer: Self-pay

## 2020-12-19 ENCOUNTER — Telehealth: Payer: Self-pay | Admitting: Orthopaedic Surgery

## 2020-12-19 DIAGNOSIS — G8929 Other chronic pain: Secondary | ICD-10-CM

## 2020-12-19 DIAGNOSIS — M5441 Lumbago with sciatica, right side: Secondary | ICD-10-CM

## 2020-12-19 DIAGNOSIS — F329 Major depressive disorder, single episode, unspecified: Secondary | ICD-10-CM

## 2020-12-19 DIAGNOSIS — I10 Essential (primary) hypertension: Secondary | ICD-10-CM | POA: Diagnosis not present

## 2020-12-19 DIAGNOSIS — G4700 Insomnia, unspecified: Secondary | ICD-10-CM

## 2020-12-19 NOTE — Telephone Encounter (Signed)
Pt called stating he discussed having an injection done with Dr.Blackman if PT didn't work. The pt states PT hasn't helped and he would like to go forward with the inj ASAP, however he's not sure if it was a cortisone or a gel inj. If gel pt would like to start the authorization on that but if cortisone he would like a CB so he can move up his appt.   [phone]

## 2020-12-19 NOTE — Telephone Encounter (Signed)
Sent referral for ESI to [facility]

## 2020-12-19 NOTE — Progress Notes (Signed)
Chronic Care Management Pharmacy Note  12/19/2020 Name:  Taylor Harvey. MRN:  [id number] DOB:  June 13, 1950  Recommendations/Changes made from today's visit: No Rx changes  Subjective: Taylor Harvey. is an 70 y.o. year old male who is a primary patient of Tabori, Aundra Millet, MD.  The CCM team was consulted for assistance with disease management and care coordination needs.    Engaged with patient by telephone for follow up visit in response to provider referral for pharmacy case management and/or care coordination services.   Consent to Services:  The patient was given information about Chronic Care Management services, agreed to services, and gave verbal consent prior to initiation of services.  Please see initial visit note for detailed documentation.   Patient Care Team: Midge Minium, MD as PCP - General (Family Medicine) Loletha Carrow Kirke Corin, MD as Consulting Physician (Gastroenterology) Mcarthur Rossetti, MD as Consulting Physician (Orthopedic Surgery) Madelin Rear, [facility] as Pharmacist (Pharmacist)  Objective:  Lab Results  Component Value Date   CREATININE 1.08 04/14/2019   CREATININE 1.03 01/14/2018   CREATININE 0.90 08/19/2017    Lab Results  Component Value Date   HGBA1C 6.3 04/14/2019   Last diabetic Eye exam: No results found for: HMDIABEYEEXA  Last diabetic Foot exam: No results found for: HMDIABFOOTEX      Component Value Date/Time   CHOL 108 04/14/2019 1015   TRIG 111.0 04/14/2019 1015   TRIG 89 05/01/2006 0942   HDL 31.80 (L) 04/14/2019 1015   CHOLHDL 3 04/14/2019 1015   VLDL 22.2 04/14/2019 1015   LDLCALC 54 04/14/2019 1015   LDLDIRECT 47.0 02/16/2017 1455    Hepatic Function Latest Ref Rng & Units 04/14/2019 01/14/2018 07/23/2017  Total Protein 6.0 - 8.3 g/dL 6.4 6.3 6.0  Albumin 3.5 - 5.2 g/dL 4.2 4.1 3.7  AST 0 - 37 U/L '15 16 19  ' ALT 0 - 53 U/L '19 26 30  ' Alk Phosphatase 39 - 117 U/L 95 96 82  Total Bilirubin 0.2 - 1.2  mg/dL 0.6 0.5 0.5  Bilirubin, Direct 0.0 - 0.3 mg/dL 0.1 0.1 0.1    Lab Results  Component Value Date/Time   TSH 1.06 04/14/2019 10:15 AM   TSH 0.91 01/14/2018 08:52 AM    CBC Latest Ref Rng & Units 04/14/2019 01/14/2018 07/23/2017  WBC 4.0 - 10.5 K/uL 4.9 4.6 3.8(L)  Hemoglobin 13.0 - 17.0 g/dL 14.2 15.1 14.5  Hematocrit 39.0 - 52.0 % 43.2 44.9 43.4  Platelets 150.0 - 400.0 K/uL 190.0 172.0 161.0    No results found for: VD25OH  Clinical ASCVD:  The ASCVD Risk score Mikey Bussing DC Jr., et al., 2013) failed to calculate for the following reasons:   The valid total cholesterol range is 130 to 320 mg/dL    Social History   Tobacco Use  Smoking Status Never  Smokeless Tobacco Never   BP Readings from Last 3 Encounters:  05/22/20 (!) 150/80  10/21/19 122/86  10/04/19 (!) 118/56   Pulse Readings from Last 3 Encounters:  05/22/20 73  10/21/19 89  10/04/19 67   Wt Readings from Last 3 Encounters:  05/22/20 256 lb 6.4 oz (116.3 kg)  04/30/20 250 lb (113.4 kg)  11/11/19 255 lb (115.7 kg)    Assessment: Review of patient past medical history, allergies, medications, health status, including review of consultants reports, laboratory and other test data, was performed as part of comprehensive evaluation and provision of chronic care management services.   SDOH:  (  Social Determinants of Health) assessments and interventions performed: Yes   CCM Care Plan  No Known Allergies  Medications Reviewed Today     Reviewed by Madelin Rear, [facility] (Pharmacist) on 12/19/20 at 1355  Med List Status: <None>   Medication Order Taking? Sig Documenting Provider Last Dose Status Informant  ADVAIR DISKUS 250-50 MCG/DOSE AEPB [id number]  Inhale 1 puff into the lungs 2 (two) times daily. INHALE 1 PUFF INTO LUNGS TWICE DAILY Midge Minium, MD  Active   aspirin 81 MG tablet [id number] Yes Take 81 mg by mouth daily. [provider] Taking Active   citalopram (CELEXA) 20 MG tablet [id number]  Yes TAKE ONE TABLET BY MOUTH EVERY MORNING Midge Minium, MD Taking Active   esomeprazole (NEXIUM) 40 MG capsule [id number] Yes TAKE ONE CAPSULE BY MOUTH EVERY MORNING Midge Minium, MD Taking Active   HYDROcodone-acetaminophen (NORCO/VICODIN) 5-325 MG tablet [id number]  Take 1 tablet by mouth every 6 (six) hours as needed for moderate pain. Midge Minium, MD  Active   meclizine (ANTIVERT) 25 MG tablet [id number]  TAKE 1 TABLET BY MOUTH THREE TIMES DAILY AS NEEDED FOR DIZZINESS Midge Minium, MD  Active   ondansetron (ZOFRAN) 4 MG tablet [id number]  TAKE 1 TABLET BY MOUTH EVERY 8 HOURS AS NEEDED FOR NAUSEA OR VOMITING Midge Minium, MD  Active   simvastatin (ZOCOR) 10 MG tablet [id number] Yes TAKE ONE TABLET BY MOUTH EVERYDAY AT BEDTIME Midge Minium, MD Taking Active   telmisartan (MICARDIS) 80 MG tablet [id number] Yes TAKE ONE TABLET BY MOUTH EVERY MORNING Midge Minium, MD Taking Active   tiZANidine (ZANAFLEX) 4 MG tablet [id number]  Take 1 tablet (4 mg total) by mouth every 8 (eight) hours as needed for muscle spasms. Mcarthur Rossetti, MD  Active   traZODone (DESYREL) 50 MG tablet [id number] Yes TAKE ONE-HALF TO 1 TABLET BY MOUTH AT bedtime AS NEEDED FOR SLEEP Midge Minium, MD Taking Active             Patient Active Problem List   Diagnosis Date Noted   GERD (gastroesophageal reflux disease) 04/14/2019   Status post arthroscopy of left shoulder 10/15/2017   Lateral epicondylitis, left elbow 04/08/2017   Chronic left shoulder pain 02/17/2017   Pain in left elbow 02/17/2017   Left upper arm pain 01/20/2017   Insomnia 07/19/2014   Urinary frequency 07/19/2014   Physical exam 07/19/2014   Morbid obesity ([place]) 06/28/2013   Hyperlipidemia 11/24/2008   ANXIETY STATE, UNSPECIFIED 06/02/2008   NEOPLASM, SKIN, UNCERTAIN BEHAVIOR 37/08/8887   TRIGGER FINGER 03/02/2008   BACK PAIN WITH RADICULOPATHY 03/02/2008   BACK PAIN 07/15/2007   DYSPNEA  ON EXERTION 11/09/2006   POLYP, COLON 07/19/2006   Essential hypertension 07/19/2006   Sleep apnea 07/19/2006   DIVERTICULOSIS, ASYMPTOMATIC 06/11/2006    Immunization History  Administered Date(s) Administered   Influenza Split 03/29/2012   Influenza Whole 03/30/2009, 02/24/2011   Influenza, High Dose Seasonal PF 02/19/2019   Influenza,inj,Quad PF,6+ Mos 03/21/2013, 03/17/2014, 03/20/2016, 02/16/2017, 01/14/2018   Pneumococcal Conjugate-13 07/19/2014   Pneumococcal Polysaccharide-23 01/07/2016   Td 03/04/2007, 09/22/2018   Zoster, Live 03/29/2012    Conditions to be addressed/monitored: HTN HLD Depression Insomnia GERD   Care Plan : ccm pharmacy care plan  Updates made by Madelin Rear, [place] since 12/19/2020 12:00 AM     Problem: HTN HLD Depression Insomnia GERD   Priority: High     Long-Range Goal: Disease Management   Start Date:  12/19/2020  Expected End Date: 12/19/2021  This Visit's Progress: On track  Priority: High  Note:   Current Barriers:  No problems noted at this time  Pharmacist Clinical Goal(s):  Patient will verbalize ability to afford treatment regimen contact provider office for questions/concerns as evidenced notation of same in electronic health record through collaboration with PharmD and provider.   Interventions: 1:1 collaboration with Midge Minium, MD regarding development and update of comprehensive plan of care as evidenced by provider attestation and co-signature Inter-disciplinary care team collaboration (see longitudinal plan of care) Comprehensive medication review performed; medication list updated in electronic medical record  Hypertension  (Status:Goal on track: YES.)   Med Management Intervention: No changes, reviewed home readings  (BP goal <130/80) -Controlled -Current treatment: Telmisartan 80 mg once daily -Current home readings: reports to be at goal, not checking consistently  -Denies hypotensive/hypertensive  symptoms -Educated on BP goals and benefits of medications for prevention of heart attack, stroke and kidney damage; Importance of home blood pressure monitoring; -Counseled to monitor BP at home 1-2x/week, document, and provide log at future appointments -Recommended to continue current medication  Depression (Goal: minimize symptoms) -Controlled -Current treatment: Citalopram 20 mg once daily -PHQ9: 0 -Reviewed side effects - no issues or complaints  -Recommended to continue current medication  Insomnia (Goal: improve sleep quality and hygiene) -Controlled -Trazodone working well for patient, sleeps through the night consistently. Uses half of 50 mg tablet every night.  -Current treatment  Trazodone 50 mg tablet - half tablet once every night. -Reviewed side effects - No dizziness or other side effects noted -Recommended to continue current medication Counseled on sleep hygiene  Patient Goals/Self-Care Activities Patient will:  - take medications as prescribed collaborate with provider on medication access solutions  Follow Up Plan: Pharmacist f/u call 6 months. HC med synch calls.   Medication Assistance: Utilizing Enh[facility] Pharmacy Services through [facility] - affirmed all current needs with medications are met.     Patient's preferred pharmacy is:  [facility] - [place], [place] - [address] [place] [address] Phone: [phone] Fax: [phone]  [place], [place] [address] Phone: [phone] Fax: [phone]  Follow Up:  Patient agrees to Care Plan and Follow-up.  Future Appointments  Date Time Provider [place]  12/24/2020  8:45 AM Laureen Abrahams, PT [facility] None  12/31/2020  3:00 PM Mcarthur Rossetti, MD [facility] None   Madelin Rear, PharmD, CPP Clinical Pharmacist Practitioner  [facility]  [phone]

## 2020-12-19 NOTE — Patient Instructions (Signed)
Taylor Harvey,  Thank you for talking with me today. I have included our care plan/goals in the following pages.   Please review and call me at [phone] with any questions.  Thanks! Ellin Mayhew, Pharm.D., BCGP Clinical Pharmacist [facility] [place] [phone] Patient Care Plan: ccm pharmacy care plan     Problem Identified: HTN HLD Depression Insomnia GERD   Priority: High     Long-Range Goal: Disease Management   Start Date: 12/19/2020  Expected End Date: 12/19/2021  This Visit's Progress: On track  Priority: High  Note:   Current Barriers:  No problems noted at this time  Pharmacist Clinical Goal(s):  Patient will verbalize ability to afford treatment regimen contact provider office for questions/concerns as evidenced notation of same in electronic health record through collaboration with PharmD and provider.   Interventions: 1:1 collaboration with Midge Minium, MD regarding development and update of comprehensive plan of care as evidenced by provider attestation and co-signature Inter-disciplinary care team collaboration (see longitudinal plan of care) Comprehensive medication review performed; medication list updated in electronic medical record  Hypertension  (Status:Goal on track: YES.)   Med Management Intervention: No changes, reviewed home readings  (BP goal <130/80) -Controlled -Current treatment: Telmisartan 80 mg once daily -Current home readings: reports to be at goal, not checking consistently  -Denies hypotensive/hypertensive symptoms -Educated on BP goals and benefits of medications for prevention of heart attack, stroke and kidney damage; Importance of home blood pressure monitoring; -Counseled to monitor BP at home 1-2x/week, document, and provide log at future appointments -Recommended to continue current medication  Depression (Goal: minimize symptoms) -Controlled -Current  treatment: Citalopram 20 mg once daily -PHQ9: 0 -Reviewed side effects - no issues or complaints  -Recommended to continue current medication  Insomnia (Goal: improve sleep quality and hygiene) -Controlled -Trazodone working well for patient, sleeps through the night consistently. Uses half of 50 mg tablet every night.  -Current treatment  Trazodone 50 mg tablet - half tablet once every night. -Reviewed side effects - No dizziness or other side effects noted -Recommended to continue current medication Counseled on sleep hygiene  Patient Goals/Self-Care Activities Patient will:  - take medications as prescribed collaborate with provider on medication access solutions  Follow Up Plan: Pharmacist f/u call 6 months. HC med synch calls.   Medication Assistance: Utilizing Enhanced Pharmacy Services through upstream - affirmed all current needs with medications are met.     The patient verbalized understanding of instructions provided today and agreed to receive a MyChart copy of patient instruction and/or educational materials. Telephone follow up appointment with pharmacy team member scheduled for: See next appointment with "Care Management Staff" under "What's Next" below.

## 2020-12-20 ENCOUNTER — Telehealth: Payer: PPO

## 2020-12-24 ENCOUNTER — Other Ambulatory Visit: Payer: Self-pay

## 2020-12-24 ENCOUNTER — Encounter: Payer: Self-pay | Admitting: Physical Therapy

## 2020-12-24 ENCOUNTER — Telehealth: Payer: Self-pay

## 2020-12-24 ENCOUNTER — Ambulatory Visit (INDEPENDENT_AMBULATORY_CARE_PROVIDER_SITE_OTHER): Payer: PPO | Admitting: Physical Therapy

## 2020-12-24 DIAGNOSIS — M545 Low back pain, unspecified: Secondary | ICD-10-CM

## 2020-12-24 DIAGNOSIS — R29898 Other symptoms and signs involving the musculoskeletal system: Secondary | ICD-10-CM

## 2020-12-24 NOTE — Therapy (Addendum)
[facility] [address] [place], [place], [address] Phone: [phone]   Fax:  [phone]  Physical Therapy Treatment/Discharge Summary  Patient Details  Name: Taylor Harvey. MRN: [id number] Date of Birth: 1950-12-01 Referring Provider (PT): Mcarthur Rossetti, MD   Encounter Date: 12/24/2020   PT End of Session - 12/24/20 0931     Visit Number 4    Number of Visits 12    Date for PT Re-Evaluation 12/31/20    Progress Note Due on Visit 10    PT Start Time [phone]   pt arrived late   PT Stop Time 0927    PT Time Calculation (min) 29 min    Activity Tolerance Patient tolerated treatment well    Behavior During Therapy [facility] for tasks assessed/performed             Past Medical History:  Diagnosis Date   Allergy    Anxiety    Arthritis    Chronic kidney disease    hx kidney stones   Depression    GERD (gastroesophageal reflux disease)    Hyperlipemia    Hypertension    Sleep apnea 2005   moderate-has cpap-not used in 3 yr    Past Surgical History:  Procedure Laterality Date   COLONOSCOPY     INSERTION OF MESH  05/05/2012   Procedure: INSERTION OF MESH;  Surgeon: Rolm Bookbinder, MD;  Location: [facility];  Service: General;  Laterality: N/A;   LITHOTRIPSY     x2   SHOULDER ARTHROSCOPY  07/2017   trigger finger surgery  2706   [place]  05/05/2012   Procedure: HERNIA REPAIR UMBILICAL ADULT;  Surgeon: Rolm Bookbinder, MD;  Location: [place];  Service: General;  Laterality: N/A;    There were no vitals filed for this visit.   Subjective Assessment - 12/24/20 0900     Subjective had another episode last week of back locking up when reaching to get something out of the cabinet, then yesterday just turned to respond to wife and it happened again.  reports it's "releasing" faster but it's still there    Pertinent History anxiety, arthritis, depression, HTN, OSA     Patient Stated Goals improve symptoms, no pain    Currently in Pain? Yes    Pain Score 6     Pain Location Back    Pain Orientation Right;Lower    Pain Descriptors / Indicators Aching;Spasm;Shooting    Pain Type Acute pain    Pain Onset 1 to 4 weeks ago    Pain Frequency Intermittent    Aggravating Factors  standing, walking, getting up in AM    Pain Relieving Factors rest and tylenol                [facility] PT Assessment - 12/24/20 0929       Assessment   Medical Diagnosis M54.41,G89.29 (ICD-10-CM) - Chronic right-sided low back pain with right-sided sciatica    Referring Provider (PT) Mcarthur Rossetti, MD      Observation/Other Assessments   Focus on Therapeutic Outcomes (FOTO)  30                           [facility] PT Treatment/Exercise - 12/24/20 0001       Lumbar Exercises: Stretches   Single Knee to Chest Stretch Right;2 reps;20 seconds    Piriformis Stretch Right;3 reps;20 seconds  Lumbar Exercises: Aerobic   Nustep L6 x 5 min      Manual Therapy   Soft tissue mobilization Rt glute med with compression              Trigger Point Dry Needling - 12/24/20 0912     Consent Given? Yes    Education Handout Provided Previously provided    Muscles Treated Back/Hip Gluteus medius    Gluteus Medius Response Twitch response elicited                    PT Short Term Goals - 12/03/20 0926       PT SHORT TERM GOAL #1   Title independent with initial HEP    Baseline 7/11: mod cues needed    Time 3    Period Weeks    Status On-going    Target Date 12/10/20               PT Long Term Goals - 12/24/20 0932       PT LONG TERM GOAL #1   Title independent with final HEP    Time 6    Period Weeks    Status On-going    Target Date 12/31/20      PT LONG TERM GOAL #2   Title FOTO score improved to 67 for improved function    Time 6    Period Weeks    Status On-going      PT LONG TERM GOAL #3   Title  perform lumbar ROM without increase in symptoms for improved function    Time 6    Period Weeks    Status On-going      PT LONG TERM GOAL #4   Title report pain < 3/10 with transitional movements for improved function and mobility    Time 6    Period Weeks    Status On-going                   Plan - 12/24/20 0932     Clinical Impression Statement Pt tolerated session well with reported reduction in symptoms today following DN and manual therapy.  Pt now scheduled for injection next week, so he's requesting to hold PT and wants to see how the injection goes.  All goals ongoing at this time.    Personal Factors and Comorbidities Comorbidity 3+    Comorbidities anxiety, arthritis, depression, HTN, OSA    Examination-Activity Limitations Sit;Bed Mobility;Bend;Carry;Stand;Lift;Locomotion Level;Transfers    Examination-Participation Restrictions Community Activity;Occupation    Stability/Clinical Decision Making Evolving/Moderate complexity    Rehab Potential Good    PT Frequency 2x / week   1-2x/wk   PT Duration 6 weeks    PT Treatment/Interventions ADLs/Self Care Home Management;Cryotherapy;Electrical Stimulation;Moist Heat;Traction;Therapeutic exercise;Therapeutic activities;Functional mobility training;Gait training;Neuromuscular re-education;Patient/family education;Manual techniques;Taping;Passive range of motion;Dry needling;Spinal Manipulations    PT Next Visit Plan hold PT per pt request, d/c if pt doesn't return    PT Home Exercise Plan Access Code: [email or web address]    Consulted and Agree with Plan of Care Patient             Patient will benefit from skilled therapeutic intervention in order to improve the following deficits and impairments:  Increased fascial restricitons, Increased muscle spasms, Pain, Decreased mobility, Impaired flexibility, Postural dysfunction, Decreased strength  Visit Diagnosis: Right-sided low back pain without sciatica, unspecified  chronicity  Other symptoms and signs involving the musculoskeletal system     Problem List Patient Active Problem  List   Diagnosis Date Noted   GERD (gastroesophageal reflux disease) 04/14/2019   Status post arthroscopy of left shoulder 10/15/2017   Lateral epicondylitis, left elbow 04/08/2017   Chronic left shoulder pain 02/17/2017   Pain in left elbow 02/17/2017   Left upper arm pain 01/20/2017   Insomnia 07/19/2014   Urinary frequency 07/19/2014   Physical exam 07/19/2014   Morbid obesity ([place]) 06/28/2013   Hyperlipidemia 11/24/2008   ANXIETY STATE, UNSPECIFIED 06/02/2008   NEOPLASM, SKIN, UNCERTAIN BEHAVIOR 88/03/314   TRIGGER FINGER 03/02/2008   BACK PAIN WITH RADICULOPATHY 03/02/2008   BACK PAIN 07/15/2007   DYSPNEA ON EXERTION 11/09/2006   POLYP, COLON 07/19/2006   Essential hypertension 07/19/2006   Sleep apnea 07/19/2006   DIVERTICULOSIS, ASYMPTOMATIC 06/11/2006        Laureen Abrahams, PT, DPT 12/24/20 9:36 AM   [facility] [address] [place], [place], [address] Phone: [phone]   Fax:  [phone]  Name: Taylor Harvey. MRN: [id number] Date of Birth: March 07, 1951     PHYSICAL THERAPY DISCHARGE SUMMARY  Visits from Start of Care: 4  Current functional level related to goals / functional outcomes: See above   Remaining deficits: See above; had injection and repeat MRI since last visit   Education / Equipment: HEP   Patient agrees to discharge. Patient goals were not met. Patient is being discharged due to not returning since the last visit.   Laureen Abrahams, PT, DPT 02/04/21 1:38 PM  [facility] [address] [place], [place], [address] Phone: [phone]   Fax:  [phone]

## 2020-12-24 NOTE — Progress Notes (Addendum)
    Chronic Care Management Pharmacy Assistant   Name: Bentlei Mcmoore.  MRN: [id number] DOB: [birth year]   Reason for Encounter: Monthly Medication Coordination Call    Recent office visits:  None noted.   Recent consult visits:  12/24/20 Faustino Congress, PT - Rehabilitation Medicine - Right sided low back pain - No medication changes. Follow up on hold at patient's request.  Hospital visits:  None in previous 6 months  Medications: Outpatient Encounter Medications as of 12/24/2020  Medication Sig   ondansetron (ZOFRAN) 4 MG tablet TAKE 1 TABLET BY MOUTH EVERY 8 HOURS AS NEEDED FOR NAUSEA OR VOMITING   ADVAIR DISKUS 250-50 MCG/DOSE AEPB Inhale 1 puff into the lungs 2 (two) times daily. INHALE 1 PUFF INTO LUNGS TWICE DAILY   aspirin 81 MG tablet Take 81 mg by mouth daily.   citalopram (CELEXA) 20 MG tablet TAKE ONE TABLET BY MOUTH EVERY MORNING   esomeprazole (NEXIUM) 40 MG capsule TAKE ONE CAPSULE BY MOUTH EVERY MORNING   HYDROcodone-acetaminophen (NORCO/VICODIN) 5-325 MG tablet Take 1 tablet by mouth every 6 (six) hours as needed for moderate pain.   meclizine (ANTIVERT) 25 MG tablet TAKE 1 TABLET BY MOUTH THREE TIMES DAILY AS NEEDED FOR DIZZINESS   simvastatin (ZOCOR) 10 MG tablet TAKE ONE TABLET BY MOUTH EVERYDAY AT BEDTIME   telmisartan (MICARDIS) 80 MG tablet TAKE ONE TABLET BY MOUTH EVERY MORNING   tiZANidine (ZANAFLEX) 4 MG tablet Take 1 tablet (4 mg total) by mouth every 8 (eight) hours as needed for muscle spasms.   traZODone (DESYREL) 50 MG tablet TAKE ONE-HALF TO 1 TABLET BY MOUTH AT bedtime AS NEEDED FOR SLEEP   No facility-administered encounter medications on file as of 12/24/2020.    Reviewed chart for medication changes ahead of medication coordination call.  No OVs, Consults, or hospital visits since last care coordination call/Pharmacist visit. (If appropriate, list visit date, provider name)  No medication changes indicated OR if recent visit, treatment plan  here.  BP Readings from Last 3 Encounters:  05/22/20 (!) 150/80  10/21/19 122/86  10/04/19 (!) 118/56    Lab Results  Component Value Date   HGBA1C 6.3 04/14/2019     Patient obtains medications through Adherence Packaging  30 Days   Last adherence delivery included: (medication name and frequency) Citalopram 20 mg; one every morning Esomeprazole 40 mg; one every morning Simvastatin 10 mg; one at bedtime Telmisartan 80 mg; one every morning Advair 250-50 mcg   Patient is due for next adherence delivery on: 01/03/21 (Sync date: 01/06/21) Called patient and reviewed medications and coordinated delivery.  This delivery to include: Citalopram 20 mg; one every morning Esomeprazole 40 mg; one every morning Simvastatin 10 mg; one at bedtime Telmisartan 80 mg; one every morning Advair 250-50 mcg   Patient needs refills for: Advair 250/50  Confirmed delivery date of 01/03/21. Advised patient that pharmacy will contact them the morning of delivery.   Star Rating Drugs: Simvastatin 10 mg - last filed 11/29/20 30 days  Telmisartan 80 mg - last filled 11/29/20 30 days   Jobe Gibbon, [place] Pharmacist Assistant  [phone]  Time Spent:  48 minutes    Chart review completed for medication changes and documentation accuracy.Dwana Curd

## 2020-12-25 MED ORDER — FLUTICASONE-SALMETEROL 250-50 MCG/ACT IN AEPB: 1.0000 | INHALATION_SPRAY | Freq: Two times a day (BID) | RESPIRATORY_TRACT | 0 refills | Status: DC

## 2020-12-25 NOTE — Addendum Note (Signed)
Addended by: Madelin Rear on: 12/25/2020 01:16 PM   Modules accepted: Orders

## 2020-12-26 DIAGNOSIS — C44311 Basal cell carcinoma of skin of nose: Secondary | ICD-10-CM | POA: Diagnosis not present

## 2020-12-31 ENCOUNTER — Ambulatory Visit: Payer: PPO | Admitting: Orthopaedic Surgery

## 2021-01-01 ENCOUNTER — Ambulatory Visit: Payer: PPO | Admitting: Physical Medicine and Rehabilitation

## 2021-01-01 ENCOUNTER — Encounter: Payer: Self-pay | Admitting: Physical Medicine and Rehabilitation

## 2021-01-01 ENCOUNTER — Ambulatory Visit: Payer: Self-pay

## 2021-01-01 ENCOUNTER — Other Ambulatory Visit: Payer: Self-pay

## 2021-01-01 VITALS — BP 105/66 | HR 73

## 2021-01-01 DIAGNOSIS — M5416 Radiculopathy, lumbar region: Secondary | ICD-10-CM | POA: Diagnosis not present

## 2021-01-01 MED ORDER — BETAMETHASONE SOD PHOS & ACET 6 (3-3) MG/ML IJ SUSP
12.0000 mg | Freq: Once | INTRAMUSCULAR | Status: AC
  Administered 2021-01-01: 12 mg

## 2021-01-01 NOTE — Patient Instructions (Signed)

## 2021-01-01 NOTE — Procedures (Signed)
Lumbar Epidural Steroid Injection - Interlaminar Approach with Fluoroscopic Guidance  Patient: Taylor Harvey.      Date of Birth: December 02, 1950 MRN: [id number] PCP: Midge Minium, MD      Visit Date: 01/01/2021   Universal Protocol:     Consent Given By: the patient  Position: PRONE  Additional Comments: Vital signs were monitored before and after the procedure. Patient was prepped and draped in the usual sterile fashion. The correct patient, procedure, and site was verified.   Injection Procedure Details:   Procedure diagnoses: Lumbar radiculopathy [M54.16]   Meds Administered:  Meds ordered this encounter  Medications   betamethasone acetate-betamethasone sodium phosphate (CELESTONE) injection 12 mg     Laterality: Right  Location/Site:  L4-L5  Needle: 3.5 in., 20 ga. Tuohy  Needle Placement: Paramedian epidural  Findings:   -Comments: Excellent flow of contrast into the epidural space.  Procedure Details: Using a paramedian approach from the side mentioned above, the region overlying the inferior lamina was localized under fluoroscopic visualization and the soft tissues overlying this structure were infiltrated with 4 ml. of 1% Lidocaine without Epinephrine. The Tuohy needle was inserted into the epidural space using a paramedian approach.   The epidural space was localized using loss of resistance along with counter oblique bi-planar fluoroscopic views.  After negative aspirate for air, blood, and CSF, a 2 ml. volume of Isovue-250 was injected into the epidural space and the flow of contrast was observed. Radiographs were obtained for documentation purposes.    The injectate was administered into the level noted above.   Additional Comments:  The patient tolerated the procedure well Dressing: 2 x 2 sterile gauze and Band-Aid    Post-procedure details: Patient was observed during the procedure. Post-procedure instructions were reviewed.  Patient  left the clinic in stable condition.

## 2021-01-01 NOTE — Progress Notes (Signed)
Taylor Harvey. - 70 y.o. male MRN [id number]  Date of birth: 26-Mar-1951  Office Visit Note: Visit Date: 01/01/2021 PCP: Midge Minium, MD Referred by: Midge Minium, MD  Subjective: Chief Complaint  Patient presents with   Lower Back - Pain   HPI:  Taylor Harvey. is a 70 y.o. male who comes in today at the request of Dr. [name] for planned Right L4-L5 Lumbar Interlaminar epidural steroid injection with fluoroscopic guidance.  The patient has failed conservative care including home exercise, medications, time and activity modification.  This injection will be diagnostic and hopefully therapeutic.  Please see requesting physician notes for further details and justification. MRI reviewed with images and spine model.  MRI reviewed in the note below.  Physical therapy notes also reviewed.  Patient with right lateral disc at L4-5 facet arthropathy left more than right with cyst on the left at L5-S1.     ROS Otherwise per HPI.  Assessment & Plan: Visit Diagnoses:    ICD-10-CM   1. Lumbar radiculopathy  M54.16 XR C-ARM NO REPORT    Epidural Steroid injection    betamethasone acetate-betamethasone sodium phosphate (CELESTONE) injection 12 mg      Plan: No additional findings.   Meds & Orders:  Meds ordered this encounter  Medications   betamethasone acetate-betamethasone sodium phosphate (CELESTONE) injection 12 mg    Orders Placed This Encounter  Procedures   XR C-ARM NO REPORT   Epidural Steroid injection    Follow-up: No follow-ups on file.   Procedures: No procedures performed  Lumbar Epidural Steroid Injection - Interlaminar Approach with Fluoroscopic Guidance  Patient: Taylor Harvey.      Date of Birth: 06-06-1950 MRN: [id number] PCP: Midge Minium, MD      Visit Date: 01/01/2021   Universal Protocol:     Consent Given By: the patient  Position: PRONE  Additional Comments: Vital signs were monitored before and  after the procedure. Patient was prepped and draped in the usual sterile fashion. The correct patient, procedure, and site was verified.   Injection Procedure Details:   Procedure diagnoses: Lumbar radiculopathy [M54.16]   Meds Administered:  Meds ordered this encounter  Medications   betamethasone acetate-betamethasone sodium phosphate (CELESTONE) injection 12 mg     Laterality: Right  Location/Site:  L4-L5  Needle: 3.5 in., 20 ga. Tuohy  Needle Placement: Paramedian epidural  Findings:   -Comments: Excellent flow of contrast into the epidural space.  Procedure Details: Using a paramedian approach from the side mentioned above, the region overlying the inferior lamina was localized under fluoroscopic visualization and the soft tissues overlying this structure were infiltrated with 4 ml. of 1% Lidocaine without Epinephrine. The Tuohy needle was inserted into the epidural space using a paramedian approach.   The epidural space was localized using loss of resistance along with counter oblique bi-planar fluoroscopic views.  After negative aspirate for air, blood, and CSF, a 2 ml. volume of Isovue-250 was injected into the epidural space and the flow of contrast was observed. Radiographs were obtained for documentation purposes.    The injectate was administered into the level noted above.   Additional Comments:  The patient tolerated the procedure well Dressing: 2 x 2 sterile gauze and Band-Aid    Post-procedure details: Patient was observed during the procedure. Post-procedure instructions were reviewed.  Patient left the clinic in stable condition.   Clinical History: MRI LUMBAR SPINE WITHOUT CONTRAST   TECHNIQUE: Multiplanar, multisequence  MR imaging of the lumbar spine was performed. No intravenous contrast was administered.   COMPARISON:  Multiple exams, including 10/15/2011 and 09/20/2003   FINDINGS: Segmentation: Correlation with other studies reveals that  there are 12 pairs of fully formed ribs and 6 lumbar type non-rib-bearing vertebra, the lowest of which is segmental. Accordingly, this segmental transitional lumbosacral vertebra is labeled S1.   Alignment:  3 mm degenerative grade 1 anterolisthesis at L5-S1.   Vertebrae: Short pedicles in the lumbar spine. Disc desiccation at all lumbar levels. Minimal vertebral marrow heterogeneity. No significant vertebral edema.   Conus medullaris: Extends to the T12-L1 level and appears normal.   Paraspinal and other soft tissues: The transitional vertebra right transverse process is fused to the rest of the sacrum.   Disc levels:   L1-2: Unremarkable. This level is only included on the parasagittal images.   L2-3: Mild displacement of the right L2 nerve in the lateral extraforaminal space due to underlying disc bulge.   L3-4: Mild bilateral foraminal stenosis and mild displacement of the right L3 nerve in the lateral extraforaminal space due to disc bulge, intervertebral spurring, and right lateral extraforaminal disc protrusion.   L4-5: Mild bilateral foraminal stenosis and mild displacement of the right L4 nerve in the lateral extraforaminal space due to disc bulge and right lateral extraforaminal disc protrusion in addition to facet arthropathy. Borderline central narrowing of the thecal sac.   L5-S1: Mild left foraminal stenosis and borderline left subarticular lateral recess stenosis due to disc uncovering, central disc protrusion, and left greater than right facet arthropathy. I suspect a 6 mm synovial cyst lateral to the left facet joint but not currently causing impingement, image 23/6.   L5-S1:  No impingement.   IMPRESSION: 1. First of all, please note that the transitional lumbosacral vertebra is labeled S 1. 2. Lumbar spondylosis and degenerative disc disease, causing mild impingement at the L2- 3, L3-4, L4-5, and L5-S1 levels as detailed above.     Electronically  Signed   By: Van Clines M.D.   On: 10/14/2015 14:30     Objective:  VS:  HT:    WT:   BMI:     BP:105/66  HR:73bpm  TEMP: ( )  RESP:  Physical Exam Vitals and nursing note reviewed.  Constitutional:      General: He is not in acute distress.    Appearance: Normal appearance. He is obese. He is not ill-appearing.  HENT:     Head: Normocephalic and atraumatic.     Right Ear: External ear normal.     Left Ear: External ear normal.     Nose: No congestion.  Eyes:     Extraocular Movements: Extraocular movements intact.  Cardiovascular:     Rate and Rhythm: Normal rate.     Pulses: Normal pulses.  Pulmonary:     Effort: Pulmonary effort is normal. No respiratory distress.  Abdominal:     General: There is no distension.     Palpations: Abdomen is soft.  Musculoskeletal:        General: No tenderness or signs of injury.     Cervical back: Neck supple.     Right lower leg: No edema.     Left lower leg: No edema.     Comments: Patient has good distal strength without clonus.  Skin:    Findings: No erythema or rash.  Neurological:     General: No focal deficit present.     Mental Status: He is alert  and oriented to person, place, and time.     Sensory: No sensory deficit.     Motor: No weakness or abnormal muscle tone.     Coordination: Coordination normal.  Psychiatric:        Mood and Affect: Mood normal.        Behavior: Behavior normal.     Imaging: No results found.

## 2021-01-01 NOTE — Progress Notes (Signed)
Pt state lower back pain that travels to his buttocks mostly his right side. Pt state sitting and laying for a long time then having to get up makes the pain worse. Pt state he takes over the counter pain meds and heating to help ease pain.  Numeric Pain Rating Scale and Functional Assessment Average Pain 2   In the last MONTH (on 0-10 scale) has pain interfered with the following?  1. General activity like being  able to carry out your everyday physical activities such as walking, climbing stairs, carrying groceries, or moving a chair?  Rating(8)   +Driver, -BT, -Dye Allergies.

## 2021-01-08 ENCOUNTER — Ambulatory Visit: Payer: PPO | Admitting: Orthopaedic Surgery

## 2021-01-14 ENCOUNTER — Other Ambulatory Visit: Payer: Self-pay | Admitting: Family Medicine

## 2021-01-14 DIAGNOSIS — N200 Calculus of kidney: Secondary | ICD-10-CM

## 2021-01-15 ENCOUNTER — Ambulatory Visit: Payer: PPO | Admitting: Orthopaedic Surgery

## 2021-01-15 ENCOUNTER — Encounter: Payer: Self-pay | Admitting: Orthopaedic Surgery

## 2021-01-15 ENCOUNTER — Other Ambulatory Visit: Payer: Self-pay

## 2021-01-15 DIAGNOSIS — G8929 Other chronic pain: Secondary | ICD-10-CM

## 2021-01-15 DIAGNOSIS — M4807 Spinal stenosis, lumbosacral region: Secondary | ICD-10-CM

## 2021-01-15 DIAGNOSIS — M5441 Lumbago with sciatica, right side: Secondary | ICD-10-CM | POA: Diagnosis not present

## 2021-01-15 MED ORDER — HYDROCODONE-ACETAMINOPHEN 5-325 MG PO TABS: 1.0000 | ORAL_TABLET | Freq: Four times a day (QID) | ORAL | 0 refills | Status: DC | PRN

## 2021-01-15 NOTE — Progress Notes (Signed)
The patient comes in with continued right-sided low back pain and sciatic symptoms.  He had an MRI of his lumbar spine weight back in 2017 and it did show some nerve compression at L4-L5 to the right side.  He had had successful epidural injections at that point.  On August 9 he had a new right-sided ESI at L4-L5.  He still having some sharp low back pain to the right side.  The epidural only helped a little bit.  He is also been through activity modification and back strengthening exercises and therapy.  He still has a positive straight leg raise to the right side and irritation at the lower lumbar spine to the right side.  Since it has been 5 years since his last lumbar spine MRI, it is definitely warranted to get a new MRI of the lumbar spine to assess for further stenosis or other areas that can be causing him significant pain since he did not have good relief from his right-sided L4-L5 ESI.  This is reasonable at this standpoint to help guide the treatment plan and potentially guide where injection will be more helpful in the lumbar spine.  We will see him back after that MRI.

## 2021-01-15 NOTE — Telephone Encounter (Signed)
Patient is requesting a refill of the following medications: Requested Prescriptions   Pending Prescriptions Disp Refills   HYDROcodone-acetaminophen (NORCO/VICODIN) 5-325 MG tablet 30 tablet 0    Sig: Take 1 tablet by mouth every 6 (six) hours as needed for moderate pain.    Date of patient request: 01/14/2021 Last office visit: 05/22/2020 Date of last refill: 07/25/20 Last refill amount: 30   Taylor Harvey

## 2021-01-18 ENCOUNTER — Encounter: Payer: Self-pay | Admitting: Orthopaedic Surgery

## 2021-01-18 ENCOUNTER — Other Ambulatory Visit: Payer: Self-pay | Admitting: Family Medicine

## 2021-01-21 ENCOUNTER — Encounter: Payer: Self-pay | Admitting: Orthopaedic Surgery

## 2021-01-21 ENCOUNTER — Ambulatory Visit
Admission: RE | Admit: 2021-01-21 | Discharge: 2021-01-21 | Disposition: A | Discharge status: Home / Self Care | Payer: PPO | Source: Ambulatory Visit | Attending: Orthopaedic Surgery | Admitting: Orthopaedic Surgery

## 2021-01-21 ENCOUNTER — Other Ambulatory Visit: Payer: Self-pay

## 2021-01-21 DIAGNOSIS — M545 Low back pain, unspecified: Secondary | ICD-10-CM | POA: Diagnosis not present

## 2021-01-21 DIAGNOSIS — M48061 Spinal stenosis, lumbar region without neurogenic claudication: Secondary | ICD-10-CM | POA: Diagnosis not present

## 2021-01-21 DIAGNOSIS — M4807 Spinal stenosis, lumbosacral region: Secondary | ICD-10-CM

## 2021-01-23 ENCOUNTER — Other Ambulatory Visit: Payer: Self-pay

## 2021-01-23 ENCOUNTER — Ambulatory Visit: Payer: PPO | Admitting: Orthopaedic Surgery

## 2021-01-23 ENCOUNTER — Telehealth: Payer: Self-pay

## 2021-01-23 ENCOUNTER — Encounter: Payer: Self-pay | Admitting: Orthopaedic Surgery

## 2021-01-23 DIAGNOSIS — G8929 Other chronic pain: Secondary | ICD-10-CM | POA: Diagnosis not present

## 2021-01-23 DIAGNOSIS — M5441 Lumbago with sciatica, right side: Secondary | ICD-10-CM | POA: Diagnosis not present

## 2021-01-23 DIAGNOSIS — M4807 Spinal stenosis, lumbosacral region: Secondary | ICD-10-CM

## 2021-01-23 NOTE — Progress Notes (Signed)
The patient is following up to go over a new MRI of his lumbar spine.  He had a lumbar spine MRI in 2017 for comparison.  He recently had a right-sided L4-L5 epidural steroid injection by Dr. Ernestina Patches on August 9.  He says that helped some of his symptoms but not as much as he had hoped for.  Apparently several years ago it was more helpful.  At this point, we both felt a new MRI was warranted.  He is still having the same right-sided low back pain with radicular symptoms going down his right leg.  The new MRI is reviewed with him.  Compared to 2017 he has progressive moderate canal stenosis at L5-S1 with left greater than right subarticular narrowing.  He has moderate left and mild right foraminal stenosis at L5-S1 that is similar to his last MRI.  He has progressive moderate foraminal narrowing bilaterally at L4-L5.  He would like to try a repeat injection with Dr. Ernestina Patches at the right side.  It is potential that the injection could be at L5-S1 to the right as opposed to L4-L5.  I will certainly leave that up to Dr. Romona Curls expertise.  The patient does wish to have a referral back to Dr. Ernestina Patches for this.  We will work on getting that scheduled.

## 2021-01-23 NOTE — Progress Notes (Signed)
    Chronic Care Management Pharmacy Assistant   Name: Taylor Harvey.  MRN: [id number] DOB: 1950-08-26   Reason for Encounter: Monthly Medication Coordination Call    Recent office visits:  None noted.   Recent consult visits:  01/15/21 [name], MD - Orthopedics - Chronic right sided low back pain - MRI ordered. No medication changes. Follow up once MRI completed.   Hospital visits:  None in previous 6 months  Medications: Outpatient Encounter Medications as of 01/23/2021  Medication Sig   aspirin 81 MG tablet Take 81 mg by mouth daily.   citalopram (CELEXA) 20 MG tablet TAKE ONE TABLET BY MOUTH EVERY MORNING   esomeprazole (NEXIUM) 40 MG capsule TAKE ONE CAPSULE BY MOUTH EVERY MORNING   fluticasone-salmeterol (ADVAIR DISKUS) 250-50 MCG/ACT AEPB Inhale 1 puff into the lungs 2 (two) times daily. INHALE 1 PUFF INTO LUNGS TWICE DAILY   HYDROcodone-acetaminophen (NORCO/VICODIN) 5-325 MG tablet Take 1 tablet by mouth every 6 (six) hours as needed for moderate pain.   meclizine (ANTIVERT) 25 MG tablet TAKE 1 TABLET BY MOUTH THREE TIMES DAILY AS NEEDED FOR DIZZINESS   ondansetron (ZOFRAN) 4 MG tablet TAKE 1 TABLET BY MOUTH EVERY 8 HOURS AS NEEDED FOR NAUSEA OR VOMITING   simvastatin (ZOCOR) 10 MG tablet TAKE ONE TABLET BY MOUTH EVERYDAY AT BEDTIME   telmisartan (MICARDIS) 80 MG tablet TAKE ONE TABLET BY MOUTH EVERY MORNING   tiZANidine (ZANAFLEX) 4 MG tablet Take 1 tablet (4 mg total) by mouth every 8 (eight) hours as needed for muscle spasms.   traZODone (DESYREL) 50 MG tablet TAKE ONE-HALF TO 1 TABLET BY MOUTH AT bedtime AS NEEDED FOR SLEEP   No facility-administered encounter medications on file as of 01/23/2021.    Reviewed chart for medication changes ahead of medication coordination call.  No OVs, Consults, or hospital visits since last care coordination call/Pharmacist visit. (If appropriate, list visit date, provider name)  No medication changes indicated OR  if recent visit, treatment plan here.  BP Readings from Last 3 Encounters:  01/01/21 105/66  05/22/20 (!) 150/80  10/21/19 122/86    Lab Results  Component Value Date   HGBA1C 6.3 04/14/2019     Patient obtains medications through Adherence Packaging  30 Days   Last adherence delivery included: (medication name and frequency)  Citalopram 20 mg; one every morning Esomeprazole 40 mg; one every morning Simvastatin 10 mg; one at bedtime Telmisartan 80 mg; one every morning Advair 250-50 mcg  Patient is due for next adherence delivery on: 02/01/2021 Called patient and reviewed medications and coordinated delivery.  This delivery to include: (medication name and frequency)  Citalopram 20 mg; one every morning Esomeprazole 40 mg; one every morning Simvastatin 10 mg; one at bedtime Telmisartan 80 mg; one every morning Advair 250-50 mcg Trazodone '50mg'$  1 tablet at bedtime   Confirmed delivery date of 02/01/2021, advised patient that pharmacy will contact them the morning of delivery.  * Spoke to pharmacy and patient will receive acute sync fill for Trazodone 50 mg 1 tablet at bedtime prior to delivery and will be included in night time packs as of 02/01/21 delivery.   Star Rating Drugs: Simvastatin 10 mg - last filed 12/31/20 30 days  Telmisartan 80 mg - last filled 12/31/20 30 days    Jobe Gibbon, [facility] Clinical Pharmacist Assistant  [phone]  Time Spent: 40 minutes

## 2021-01-29 ENCOUNTER — Telehealth: Payer: Self-pay | Admitting: Physical Medicine and Rehabilitation

## 2021-01-29 NOTE — Telephone Encounter (Signed)
Pt wanted to get sch with Dr. Ernestina Patches for an inj. The best call back number is [phone].

## 2021-01-29 NOTE — Telephone Encounter (Signed)
This one is in referrals as pending.

## 2021-02-04 ENCOUNTER — Ambulatory Visit: Payer: PPO | Admitting: Orthopaedic Surgery

## 2021-02-05 ENCOUNTER — Other Ambulatory Visit: Payer: Self-pay

## 2021-02-05 ENCOUNTER — Ambulatory Visit (INDEPENDENT_AMBULATORY_CARE_PROVIDER_SITE_OTHER): Payer: PPO | Admitting: Family Medicine

## 2021-02-05 ENCOUNTER — Telehealth: Payer: Self-pay

## 2021-02-05 ENCOUNTER — Encounter: Payer: Self-pay | Admitting: Family Medicine

## 2021-02-05 VITALS — BP 150/80 | HR 61 | Temp 97.8°F | Resp 17 | Wt 253.0 lb

## 2021-02-05 DIAGNOSIS — I10 Essential (primary) hypertension: Secondary | ICD-10-CM

## 2021-02-05 DIAGNOSIS — F411 Generalized anxiety disorder: Secondary | ICD-10-CM | POA: Diagnosis not present

## 2021-02-05 MED ORDER — CITALOPRAM HYDROBROMIDE 40 MG PO TABS: 40.0000 mg | ORAL_TABLET | Freq: Every day | ORAL | 3 refills | Status: DC

## 2021-02-05 MED ORDER — ALPRAZOLAM 0.5 MG PO TABS
0.5000 mg | ORAL_TABLET | Freq: Two times a day (BID) | ORAL | 0 refills | Status: DC | PRN
Start: 2021-02-05 — End: 2022-12-05

## 2021-02-05 NOTE — Assessment & Plan Note (Signed)
Deteriorated.  Pt reports increased anxiety as he deals w/ physical pain (back), emotional pain (loss of his sister and 2 brother-in-laws).  He notes difficulty in close spaces and increased irritability.  Reports he felt similarly back in 2014 when he was prescribed alprazolam prn.  Given current state, will increase Celexa to '40mg'$  daily and add low dose alprazolam to use as needed.  Encouraged stress management, regular physical activity, and self care.  Will follow closely.

## 2021-02-05 NOTE — Assessment & Plan Note (Signed)
BP is elevated today which is unusual for pt.  He is having back pain and admits to being very anxious about today's appt.  It probably didn't help that there was a check-in error and he waited for over 45 minutes.  Will not adjust meds today but will follow closely at next visit.  Pt expressed understanding and is in agreement w/ plan.

## 2021-02-05 NOTE — Progress Notes (Signed)
   Subjective:    Patient ID: Taylor Hua., male    DOB: 12-12-1950, 70 y.o.   MRN: [id number]  HPI Anxiety- 'it feels like my insides are going turbo while my outsides are normal'.  Sxs started ~1 week ago.  Pt has 2 'bulging discs' and is in pain.  Has been under increased stress recently.  Poor sleep due to back pain but also having trouble falling asleep.  Increased irritability.  Feels 'wound' all the time.  Sxs are worse in smaller spaces.  Doesn't have issues going out or being around people.  Some sadness- has lost 3 family members.  Pt has felt similarly in the past- took Alprazolam back in 2014.  Currently on Celexa.  HTN- BP is elevated today.  No CP, SOB, HAs, visual changes, edema.   Review of Systems For ROS see HPI   This visit occurred during the SARS-CoV-2 public health emergency.  Safety protocols were in place, including screening questions prior to the visit, additional usage of staff PPE, and extensive cleaning of exam room while observing appropriate contact time as indicated for disinfecting solutions.      Objective:   Physical Exam Vitals reviewed.  Constitutional:      General: He is not in acute distress.    Appearance: Normal appearance. He is well-developed. He is obese. He is not ill-appearing.  HENT:     Head: Normocephalic and atraumatic.  Eyes:     Extraocular Movements: Extraocular movements intact.     Conjunctiva/sclera: Conjunctivae normal.     Pupils: Pupils are equal, round, and reactive to light.  Neck:     Thyroid: No thyromegaly.  Cardiovascular:     Rate and Rhythm: Normal rate and regular rhythm.     Pulses: Normal pulses.     Heart sounds: Normal heart sounds. No murmur heard. Pulmonary:     Effort: Pulmonary effort is normal. No respiratory distress.     Breath sounds: Normal breath sounds.  Abdominal:     General: Bowel sounds are normal. There is no distension.     Palpations: Abdomen is soft.  Musculoskeletal:      Cervical back: Normal range of motion and neck supple.     Right lower leg: No edema.     Left lower leg: No edema.  Lymphadenopathy:     Cervical: No cervical adenopathy.  Skin:    General: Skin is warm and dry.  Neurological:     General: No focal deficit present.     Mental Status: He is alert and oriented to person, place, and time.     Cranial Nerves: No cranial nerve deficit.  Psychiatric:        Mood and Affect: Mood normal.        Behavior: Behavior normal.          Assessment & Plan:

## 2021-02-05 NOTE — Patient Instructions (Signed)
Follow up in 1 month to recheck mood, anxiety, and blood pressure INCREASE the Citalopram to '40mg'$ - 2 of what you have at home and 1 of the new prescription TAKE the Alprazolam as needed for those high stress moments Continue to work on relaxation and stress management Call with any questions or concerns Hang in there!!!

## 2021-02-05 NOTE — Progress Notes (Signed)
    Chronic Care Management Pharmacy Assistant   Name: Heman Salah.  MRN: [id number] DOB: [birth year]   Reason for Encounter: CMA Phone Call / Clarify Citalopram Dosing    Pharmacy reached out and stated they received a new rx for Citalopram 40 mg once daily. Needed to clarify If this will be taking place of the existing 20 mg or an addition. Patient reported this is an increase to 40 mg. Per pharmacy they will try and run a prescription for a vial of 20 mg to go in addition to his packs this month as his previous packs have already been sent to him.They will  then place a 40 mg tablet in his packs for his next delivery. Patient agrees with this plan and voiced understanding.   Jobe Gibbon, [facility] Pharmacist Assistant  [phone]  Time Spent: 10 minutes

## 2021-02-11 ENCOUNTER — Ambulatory Visit: Payer: PPO | Admitting: Physical Medicine and Rehabilitation

## 2021-02-11 ENCOUNTER — Telehealth: Payer: Self-pay | Admitting: Physical Medicine and Rehabilitation

## 2021-02-11 DIAGNOSIS — M9901 Segmental and somatic dysfunction of cervical region: Secondary | ICD-10-CM | POA: Diagnosis not present

## 2021-02-11 DIAGNOSIS — M9902 Segmental and somatic dysfunction of thoracic region: Secondary | ICD-10-CM | POA: Diagnosis not present

## 2021-02-11 DIAGNOSIS — M9903 Segmental and somatic dysfunction of lumbar region: Secondary | ICD-10-CM | POA: Diagnosis not present

## 2021-02-11 DIAGNOSIS — M9904 Segmental and somatic dysfunction of sacral region: Secondary | ICD-10-CM | POA: Diagnosis not present

## 2021-02-11 NOTE — Telephone Encounter (Signed)
Pt called to cancel 9:00 appt and does not need to reschedule!

## 2021-02-11 NOTE — Telephone Encounter (Signed)
Appointment cancelled

## 2021-02-14 DIAGNOSIS — M9901 Segmental and somatic dysfunction of cervical region: Secondary | ICD-10-CM | POA: Diagnosis not present

## 2021-02-14 DIAGNOSIS — M9904 Segmental and somatic dysfunction of sacral region: Secondary | ICD-10-CM | POA: Diagnosis not present

## 2021-02-14 DIAGNOSIS — M9902 Segmental and somatic dysfunction of thoracic region: Secondary | ICD-10-CM | POA: Diagnosis not present

## 2021-02-14 DIAGNOSIS — M9903 Segmental and somatic dysfunction of lumbar region: Secondary | ICD-10-CM | POA: Diagnosis not present

## 2021-02-15 DIAGNOSIS — M9904 Segmental and somatic dysfunction of sacral region: Secondary | ICD-10-CM | POA: Diagnosis not present

## 2021-02-15 DIAGNOSIS — M9903 Segmental and somatic dysfunction of lumbar region: Secondary | ICD-10-CM | POA: Diagnosis not present

## 2021-02-15 DIAGNOSIS — M9902 Segmental and somatic dysfunction of thoracic region: Secondary | ICD-10-CM | POA: Diagnosis not present

## 2021-02-15 DIAGNOSIS — M9901 Segmental and somatic dysfunction of cervical region: Secondary | ICD-10-CM | POA: Diagnosis not present

## 2021-02-19 DIAGNOSIS — M9903 Segmental and somatic dysfunction of lumbar region: Secondary | ICD-10-CM | POA: Diagnosis not present

## 2021-02-19 DIAGNOSIS — M9901 Segmental and somatic dysfunction of cervical region: Secondary | ICD-10-CM | POA: Diagnosis not present

## 2021-02-19 DIAGNOSIS — M9904 Segmental and somatic dysfunction of sacral region: Secondary | ICD-10-CM | POA: Diagnosis not present

## 2021-02-19 DIAGNOSIS — M9902 Segmental and somatic dysfunction of thoracic region: Secondary | ICD-10-CM | POA: Diagnosis not present

## 2021-02-21 ENCOUNTER — Telehealth: Payer: Self-pay

## 2021-02-21 DIAGNOSIS — M9901 Segmental and somatic dysfunction of cervical region: Secondary | ICD-10-CM | POA: Diagnosis not present

## 2021-02-21 DIAGNOSIS — M9902 Segmental and somatic dysfunction of thoracic region: Secondary | ICD-10-CM | POA: Diagnosis not present

## 2021-02-21 DIAGNOSIS — M9903 Segmental and somatic dysfunction of lumbar region: Secondary | ICD-10-CM | POA: Diagnosis not present

## 2021-02-21 DIAGNOSIS — M9904 Segmental and somatic dysfunction of sacral region: Secondary | ICD-10-CM | POA: Diagnosis not present

## 2021-02-21 NOTE — Progress Notes (Signed)
Chronic Care Management Pharmacy Assistant   Name: Taylor Harvey.  MRN: [id number] DOB: 1950/06/01   Reason for Encounter: Medication Coordination Call     Recent office visits:  02/05/21 [name], MD (PCP) - Family Medicine - Anxiety - ALPRAZolam [name]) 0.5 MG tablet Take 1 tablet (0.5 mg total) by mouth 2 (two) times daily as needed for anxiety (new medication) and Citalopram (CELEXA) 40 MG tablet Take 1 tablet (40 mg total) by mouth daily (increase in dose) prescribed. Follow up in 1 month.   Recent consult visits:  01/23/21 [name], MD - Orthopedic Surgery - Spinal Stenosis of Lumbar region - Referral placed to Dr Ernestina Patches for possible injections. No medication changes. Follow up not indicated.   Hospital visits:  None in previous 6 months  Medications: Outpatient Encounter Medications as of 02/21/2021  Medication Sig   ALPRAZolam (XANAX) 0.5 MG tablet Take 1 tablet (0.5 mg total) by mouth 2 (two) times daily as needed for anxiety.   aspirin 81 MG tablet Take 81 mg by mouth daily.   citalopram (CELEXA) 40 MG tablet Take 1 tablet (40 mg total) by mouth daily.   esomeprazole (NEXIUM) 40 MG capsule TAKE ONE CAPSULE BY MOUTH EVERY MORNING   fluticasone-salmeterol (ADVAIR DISKUS) 250-50 MCG/ACT AEPB Inhale 1 puff into the lungs 2 (two) times daily. INHALE 1 PUFF INTO LUNGS TWICE DAILY   HYDROcodone-acetaminophen (NORCO/VICODIN) 5-325 MG tablet Take 1 tablet by mouth every 6 (six) hours as needed for moderate pain.   meclizine (ANTIVERT) 25 MG tablet TAKE 1 TABLET BY MOUTH THREE TIMES DAILY AS NEEDED FOR DIZZINESS   ondansetron (ZOFRAN) 4 MG tablet TAKE 1 TABLET BY MOUTH EVERY 8 HOURS AS NEEDED FOR NAUSEA OR VOMITING   simvastatin (ZOCOR) 10 MG tablet TAKE ONE TABLET BY MOUTH EVERYDAY AT BEDTIME   telmisartan (MICARDIS) 80 MG tablet TAKE ONE TABLET BY MOUTH EVERY MORNING   tiZANidine (ZANAFLEX) 4 MG tablet Take 1 tablet (4 mg total) by mouth every 8 (eight)  hours as needed for muscle spasms.   traZODone (DESYREL) 50 MG tablet TAKE ONE-HALF TO 1 TABLET BY MOUTH AT bedtime AS NEEDED FOR SLEEP   No facility-administered encounter medications on file as of 02/21/2021.    Reviewed chart for medication changes ahead of medication coordination call.   No OVs, Consults, or hospital visits since last care coordination call/Pharmacist visit. (If appropriate, list visit date, provider name)   No medication changes indicated OR if recent visit, treatment plan here.  BP Readings from Last 3 Encounters:  02/05/21 (!) 150/80  01/01/21 105/66  05/22/20 (!) 150/80    Lab Results  Component Value Date   HGBA1C 6.3 04/14/2019     Patient obtains medications through Adherence Packaging  30 Days   Last adherence delivery included: (medication name and frequency)  Citalopram 20 mg; one every morning Esomeprazole 40 mg; one every morning Simvastatin 10 mg; one at bedtime Telmisartan 80 mg; one every morning Advair 250-50 mcg Trazodone 50mg  1 tablet at bedtime   Patient is due for next adherence delivery on: 03/05/21. Called patient and reviewed medications and coordinated delivery.  This delivery to include:  Citalopram 40 mg; one every morning (increase in dose as of 02/10/21) Esomeprazole 40 mg; one every morning Simvastatin 10 mg; one at bedtime Telmisartan 80 mg; one every morning Advair 250-50 mcg Trazodone 50mg  1 tablet at bedtime   Patient declined the following medications (meds) due to (reason) Alprazolam 0.5 mg 1  tablet BID prn anxiety (new as of 02/10/21) patient still has several on hand and has not been taking daily. Will hold off on this prescription on this months delivery.  Patient needs refills for: Telmisartan 80 mg and Esomeprazole 40 mg from PCP (30 day supply)  Confirmed delivery date of 03/05/21, advised patient that pharmacy will contact them the morning of delivery.   Care Gaps  AWV: done 04/30/20  (due  04/30/21) Colonoscopy: done 08/13/16 (due 08/13/21) DM Eye Exam: N/A DM Foot Exam: N/A Microalbumin: N/A HbgAIC: done 04/14/19 (6.3) DEXA: never Mammogram: N/A   Star Rating Drugs: Telmisartan (MICARDIS) 80 MG tablet - last filled 01/28/21 30 days  Simvastatin (ZOCOR) 10 MG tablet - last filled 01/28/21 30 days   Future Appointments  Date Time Provider [place]  03/07/2021 11:00 AM Midge Minium, MD [facility] [place], [place] Pharmacist Assistant  [phone]  Time Spent: 35 minutes

## 2021-02-22 DIAGNOSIS — M9901 Segmental and somatic dysfunction of cervical region: Secondary | ICD-10-CM | POA: Diagnosis not present

## 2021-02-22 DIAGNOSIS — M9902 Segmental and somatic dysfunction of thoracic region: Secondary | ICD-10-CM | POA: Diagnosis not present

## 2021-02-22 DIAGNOSIS — M9903 Segmental and somatic dysfunction of lumbar region: Secondary | ICD-10-CM | POA: Diagnosis not present

## 2021-02-22 DIAGNOSIS — M9904 Segmental and somatic dysfunction of sacral region: Secondary | ICD-10-CM | POA: Diagnosis not present

## 2021-02-25 DIAGNOSIS — M9904 Segmental and somatic dysfunction of sacral region: Secondary | ICD-10-CM | POA: Diagnosis not present

## 2021-02-25 DIAGNOSIS — M9901 Segmental and somatic dysfunction of cervical region: Secondary | ICD-10-CM | POA: Diagnosis not present

## 2021-02-25 DIAGNOSIS — M9902 Segmental and somatic dysfunction of thoracic region: Secondary | ICD-10-CM | POA: Diagnosis not present

## 2021-02-25 DIAGNOSIS — M9903 Segmental and somatic dysfunction of lumbar region: Secondary | ICD-10-CM | POA: Diagnosis not present

## 2021-02-26 MED ORDER — TELMISARTAN 80 MG PO TABS: 80.0000 mg | ORAL_TABLET | Freq: Every morning | ORAL | 0 refills | Status: DC

## 2021-02-26 MED ORDER — ESOMEPRAZOLE MAGNESIUM 40 MG PO CPDR: 40.0000 mg | DELAYED_RELEASE_CAPSULE | Freq: Every morning | ORAL | 0 refills | Status: DC

## 2021-02-26 NOTE — Addendum Note (Signed)
Addended by: Madelin Rear on: 02/26/2021 08:47 AM   Modules accepted: Orders

## 2021-02-28 DIAGNOSIS — M9903 Segmental and somatic dysfunction of lumbar region: Secondary | ICD-10-CM | POA: Diagnosis not present

## 2021-02-28 DIAGNOSIS — M9902 Segmental and somatic dysfunction of thoracic region: Secondary | ICD-10-CM | POA: Diagnosis not present

## 2021-02-28 DIAGNOSIS — M9901 Segmental and somatic dysfunction of cervical region: Secondary | ICD-10-CM | POA: Diagnosis not present

## 2021-02-28 DIAGNOSIS — M9904 Segmental and somatic dysfunction of sacral region: Secondary | ICD-10-CM | POA: Diagnosis not present

## 2021-03-04 DIAGNOSIS — M9901 Segmental and somatic dysfunction of cervical region: Secondary | ICD-10-CM | POA: Diagnosis not present

## 2021-03-04 DIAGNOSIS — M9904 Segmental and somatic dysfunction of sacral region: Secondary | ICD-10-CM | POA: Diagnosis not present

## 2021-03-04 DIAGNOSIS — M9902 Segmental and somatic dysfunction of thoracic region: Secondary | ICD-10-CM | POA: Diagnosis not present

## 2021-03-04 DIAGNOSIS — M9903 Segmental and somatic dysfunction of lumbar region: Secondary | ICD-10-CM | POA: Diagnosis not present

## 2021-03-07 ENCOUNTER — Other Ambulatory Visit: Payer: Self-pay

## 2021-03-07 ENCOUNTER — Encounter: Payer: Self-pay | Admitting: Family Medicine

## 2021-03-07 ENCOUNTER — Ambulatory Visit (INDEPENDENT_AMBULATORY_CARE_PROVIDER_SITE_OTHER): Payer: PPO | Admitting: Family Medicine

## 2021-03-07 VITALS — BP 130/68 | HR 67 | Temp 97.3°F | Resp 19 | Ht 68.0 in | Wt 249.8 lb

## 2021-03-07 DIAGNOSIS — M9903 Segmental and somatic dysfunction of lumbar region: Secondary | ICD-10-CM | POA: Diagnosis not present

## 2021-03-07 DIAGNOSIS — M9901 Segmental and somatic dysfunction of cervical region: Secondary | ICD-10-CM | POA: Diagnosis not present

## 2021-03-07 DIAGNOSIS — M9904 Segmental and somatic dysfunction of sacral region: Secondary | ICD-10-CM | POA: Diagnosis not present

## 2021-03-07 DIAGNOSIS — F411 Generalized anxiety disorder: Secondary | ICD-10-CM

## 2021-03-07 DIAGNOSIS — I10 Essential (primary) hypertension: Secondary | ICD-10-CM

## 2021-03-07 DIAGNOSIS — M9902 Segmental and somatic dysfunction of thoracic region: Secondary | ICD-10-CM | POA: Diagnosis not present

## 2021-03-07 LAB — CBC WITH DIFFERENTIAL/PLATELET
Basophils Absolute: 0 10*3/uL (ref 0.0–0.1)
Basophils Relative: 0.7 % (ref 0.0–3.0)
Eosinophils Absolute: 0.1 10*3/uL (ref 0.0–0.7)
Eosinophils Relative: 3.3 % (ref 0.0–5.0)
HCT: 39.2 % (ref 39.0–52.0)
Hemoglobin: 12.6 g/dL — ABNORMAL LOW (ref 13.0–17.0)
Lymphocytes Relative: 29.4 % (ref 12.0–46.0)
Lymphs Abs: 1.3 10*3/uL (ref 0.7–4.0)
MCHC: 32.1 g/dL (ref 30.0–36.0)
MCV: 90.4 fl (ref 78.0–100.0)
Monocytes Absolute: 0.5 10*3/uL (ref 0.1–1.0)
Monocytes Relative: 11.1 % (ref 3.0–12.0)
Neutro Abs: 2.5 10*3/uL (ref 1.4–7.7)
Neutrophils Relative %: 55.5 % (ref 43.0–77.0)
Platelets: 190 10*3/uL (ref 150.0–400.0)
RBC: 4.33 Mil/uL (ref 4.22–5.81)
RDW: 14.5 % (ref 11.5–15.5)
WBC: 4.5 10*3/uL (ref 4.0–10.5)

## 2021-03-07 LAB — LIPID PANEL
Cholesterol: 98 mg/dL (ref 0–200)
HDL: 34.6 mg/dL — ABNORMAL LOW (ref >39.00)
LDL Cholesterol: 45 mg/dL (ref 0–99)
NonHDL: 63.16
Total CHOL/HDL Ratio: 3
Triglycerides: 93 mg/dL (ref 0.0–149.0)
VLDL: 18.6 mg/dL (ref 0.0–40.0)

## 2021-03-07 LAB — BASIC METABOLIC PANEL
BUN: 14 mg/dL (ref 6–23)
CO2: 28 mEq/L (ref 19–32)
Calcium: 9.1 mg/dL (ref 8.4–10.5)
Chloride: 107 mEq/L (ref 96–112)
Creatinine, Ser: 1.03 mg/dL (ref 0.40–1.50)
GFR: 72.96 mL/min (ref >60.00)
Glucose, Bld: 115 mg/dL — ABNORMAL HIGH (ref 70–99)
Potassium: 4.5 mEq/L (ref 3.5–5.1)
Sodium: 141 mEq/L (ref 135–145)

## 2021-03-07 LAB — TSH: TSH: 1.2 u[IU]/mL (ref 0.35–5.50)

## 2021-03-07 LAB — HEPATIC FUNCTION PANEL
ALT: 18 U/L (ref 0–53)
AST: 16 U/L (ref 0–37)
Albumin: 4 g/dL (ref 3.5–5.2)
Alkaline Phosphatase: 89 U/L (ref 39–117)
Bilirubin, Direct: 0.1 mg/dL (ref 0.0–0.3)
Total Bilirubin: 0.4 mg/dL (ref 0.2–1.2)
Total Protein: 5.9 g/dL — ABNORMAL LOW (ref 6.0–8.3)

## 2021-03-07 NOTE — Patient Instructions (Signed)
Schedule your complete physical in 6 months We'll notify you of your lab results and make any changes if needed Continue to work on healthy diet and regular exercise- you're doing great! No med changes at this time Call with any questions or concerns Stay Safe!  Stay Healthy! Happy Fall!!

## 2021-03-07 NOTE — Assessment & Plan Note (Addendum)
Pt is down 4 lbs in 1 month.  Pt is very pleased.  Encouraged continued work on [employer] and regular exercise.  Will check labs to risk stratify.

## 2021-03-07 NOTE — Assessment & Plan Note (Signed)
BP is much better today now that anxiety is controlled.  Asymptomatic.  Check labs due to ARB but no anticipated med changes.

## 2021-03-07 NOTE — Progress Notes (Signed)
   Subjective:    Patient ID: Taylor Hua., male    DOB: [birth year], 70 y.o.   MRN: [id number]  HPI Anxiety- at last visit we increased Citalopram to 40mg  daily.  Has only had to take 2 alprazolam and that was in the 1st week.  Less headaches.  Sleeping well.  HTN- BP has improved as anxiety improved.  We did not have to change BP medications.  No CP, SOB, HAs, visual changes.  Obesity- pt is down 4 lbs.  BMI is now 37.98   Review of Systems For ROS see HPI   This visit occurred during the SARS-CoV-2 public health emergency.  Safety protocols were in place, including screening questions prior to the visit, additional usage of staff PPE, and extensive cleaning of exam room while observing appropriate contact time as indicated for disinfecting solutions.      Objective:   Physical Exam Vitals reviewed.  Constitutional:      General: He is not in acute distress.    Appearance: Normal appearance. He is well-developed. He is obese. He is not ill-appearing.  HENT:     Head: Normocephalic and atraumatic.  Eyes:     Extraocular Movements: Extraocular movements intact.     Conjunctiva/sclera: Conjunctivae normal.     Pupils: Pupils are equal, round, and reactive to light.  Neck:     Thyroid: No thyromegaly.  Cardiovascular:     Rate and Rhythm: Normal rate and regular rhythm.     Pulses: Normal pulses.     Heart sounds: Normal heart sounds. No murmur heard. Pulmonary:     Effort: Pulmonary effort is normal. No respiratory distress.     Breath sounds: Normal breath sounds.  Abdominal:     General: Bowel sounds are normal. There is no distension.     Palpations: Abdomen is soft.  Musculoskeletal:     Cervical back: Normal range of motion and neck supple.     Right lower leg: No edema.     Left lower leg: No edema.  Lymphadenopathy:     Cervical: No cervical adenopathy.  Skin:    General: Skin is warm and dry.  Neurological:     General: No focal deficit present.      Mental Status: He is alert and oriented to person, place, and time.     Cranial Nerves: No cranial nerve deficit.  Psychiatric:        Mood and Affect: Mood normal.        Behavior: Behavior normal.          Assessment & Plan:

## 2021-03-07 NOTE — Assessment & Plan Note (Signed)
Much improved w/ increased Citalopram dose.  Has only had to take 2 Alprazolam and that was in the first week.  No med changes at this time.

## 2021-03-11 DIAGNOSIS — M9901 Segmental and somatic dysfunction of cervical region: Secondary | ICD-10-CM | POA: Diagnosis not present

## 2021-03-11 DIAGNOSIS — M9902 Segmental and somatic dysfunction of thoracic region: Secondary | ICD-10-CM | POA: Diagnosis not present

## 2021-03-11 DIAGNOSIS — M9903 Segmental and somatic dysfunction of lumbar region: Secondary | ICD-10-CM | POA: Diagnosis not present

## 2021-03-11 DIAGNOSIS — M9904 Segmental and somatic dysfunction of sacral region: Secondary | ICD-10-CM | POA: Diagnosis not present

## 2021-03-14 DIAGNOSIS — M9902 Segmental and somatic dysfunction of thoracic region: Secondary | ICD-10-CM | POA: Diagnosis not present

## 2021-03-14 DIAGNOSIS — M9904 Segmental and somatic dysfunction of sacral region: Secondary | ICD-10-CM | POA: Diagnosis not present

## 2021-03-14 DIAGNOSIS — M9901 Segmental and somatic dysfunction of cervical region: Secondary | ICD-10-CM | POA: Diagnosis not present

## 2021-03-14 DIAGNOSIS — M9903 Segmental and somatic dysfunction of lumbar region: Secondary | ICD-10-CM | POA: Diagnosis not present

## 2021-03-18 DIAGNOSIS — M9904 Segmental and somatic dysfunction of sacral region: Secondary | ICD-10-CM | POA: Diagnosis not present

## 2021-03-18 DIAGNOSIS — M9901 Segmental and somatic dysfunction of cervical region: Secondary | ICD-10-CM | POA: Diagnosis not present

## 2021-03-18 DIAGNOSIS — M9902 Segmental and somatic dysfunction of thoracic region: Secondary | ICD-10-CM | POA: Diagnosis not present

## 2021-03-18 DIAGNOSIS — M9903 Segmental and somatic dysfunction of lumbar region: Secondary | ICD-10-CM | POA: Diagnosis not present

## 2021-03-21 DIAGNOSIS — M9902 Segmental and somatic dysfunction of thoracic region: Secondary | ICD-10-CM | POA: Diagnosis not present

## 2021-03-21 DIAGNOSIS — M9904 Segmental and somatic dysfunction of sacral region: Secondary | ICD-10-CM | POA: Diagnosis not present

## 2021-03-21 DIAGNOSIS — M9903 Segmental and somatic dysfunction of lumbar region: Secondary | ICD-10-CM | POA: Diagnosis not present

## 2021-03-21 DIAGNOSIS — M9901 Segmental and somatic dysfunction of cervical region: Secondary | ICD-10-CM | POA: Diagnosis not present

## 2021-03-22 ENCOUNTER — Telehealth: Payer: Self-pay

## 2021-03-22 NOTE — Progress Notes (Signed)
Chronic Care Management Pharmacy Assistant   Name: Taylor Harvey.  MRN: [id number] DOB: 29-Aug-1950   Reason for Encounter: Monthly Medication Coordination Call     Recent office visits:  03/07/21 Taylor Harvey (PCP) - Family Medicine - Anxiety -  Labs ordered. No medication changes. Follow up in 6 months for physical.  Recent consult visits:  None noted.  Hospital visits:  None in previous 6 months  Medications: Outpatient Encounter Medications as of 03/22/2021  Medication Sig   ALPRAZolam (XANAX) 0.5 MG tablet Take 1 tablet (0.5 mg total) by mouth 2 (two) times daily as needed for anxiety.   aspirin 81 MG tablet Take 81 mg by mouth daily.   citalopram (CELEXA) 40 MG tablet Take 1 tablet (40 mg total) by mouth daily.   esomeprazole (NEXIUM) 40 MG capsule Take 1 capsule (40 mg total) by mouth every morning.   fluticasone-salmeterol (ADVAIR DISKUS) 250-50 MCG/ACT AEPB Inhale 1 puff into the lungs 2 (two) times daily. INHALE 1 PUFF INTO LUNGS TWICE DAILY   HYDROcodone-acetaminophen (NORCO/VICODIN) 5-325 MG tablet Take 1 tablet by mouth every 6 (six) hours as needed for moderate pain.   meclizine (ANTIVERT) 25 MG tablet TAKE 1 TABLET BY MOUTH THREE TIMES DAILY AS NEEDED FOR DIZZINESS   ondansetron (ZOFRAN) 4 MG tablet TAKE 1 TABLET BY MOUTH EVERY 8 HOURS AS NEEDED FOR NAUSEA OR VOMITING   simvastatin (ZOCOR) 10 MG tablet TAKE ONE TABLET BY MOUTH EVERYDAY AT BEDTIME   telmisartan (MICARDIS) 80 MG tablet Take 1 tablet (80 mg total) by mouth every morning.   tiZANidine (ZANAFLEX) 4 MG tablet Take 1 tablet (4 mg total) by mouth every 8 (eight) hours as needed for muscle spasms. (Patient not taking: Reported on 03/07/2021)   traZODone (DESYREL) 50 MG tablet TAKE ONE-HALF TO 1 TABLET BY MOUTH AT bedtime AS NEEDED FOR SLEEP   No facility-administered encounter medications on file as of 03/22/2021.    Reviewed chart for medication changes ahead of medication coordination  call.  No OVs, Consults, or hospital visits since last care coordination call/Pharmacist visit. (If appropriate, list visit date, provider name)  No medication changes indicated OR if recent visit, treatment plan here.  BP Readings from Last 3 Encounters:  03/07/21 130/68  02/05/21 (!) 150/80  01/01/21 105/66    Lab Results  Component Value Date   HGBA1C 6.3 04/14/2019     Patient obtains medications through Adherence Packaging  30 Days   Last adherence delivery included: (medication name and frequency)  Citalopram 40 mg; one every morning Esomeprazole 40 mg; one every morning Simvastatin 10 mg; one at bedtime Telmisartan 80 mg; one every morning Advair 250-50 mcg Trazodone 50mg  1 tablet at bedtime   Patient is due for next adherence delivery on: 04/03/21. Requests to receive delivery on 04/01/21 after 3pm  Called patient and reviewed medications and coordinated delivery.  This delivery to include:  Citalopram 40 mg; one every morning Esomeprazole 40 mg; one every morning Simvastatin 10 mg; one at bedtime Telmisartan 80 mg; one every morning Advair 250-50 mcg Trazodone 50mg  1 tablet at bedtime   Patient needs refills for None.  Confirmed requested to changed delivery date to 04/01/21, advised patient that pharmacy will contact them the morning of delivery. States he will be in [facility] on 04/03/21.   Care Gaps   AWV: done 04/30/20  (due 04/30/21) Colonoscopy: done 08/13/16 (due 08/13/21) DM Eye Exam: N/A DM Foot Exam: N/A Microalbumin: N/A HbgAIC: done 04/14/19 (6.3)  DEXA: never Mammogram: N/A   Star Rating Drugs: Telmisartan (MICARDIS) 80 MG tablet - last filled 02/26/21 30 days  Simvastatin (ZOCOR) 10 MG tablet - last filled 02/26/21 30 days   Future Appointments  Date Time Provider [place]  09/05/2021  8:30 AM Midge Minium, MD [facility] [place], [place] Pharmacist Assistant  [phone]  Time Spent: 39 minutes

## 2021-03-25 ENCOUNTER — Other Ambulatory Visit: Payer: Self-pay | Admitting: Family Medicine

## 2021-03-26 DIAGNOSIS — M9903 Segmental and somatic dysfunction of lumbar region: Secondary | ICD-10-CM | POA: Diagnosis not present

## 2021-03-26 DIAGNOSIS — M9901 Segmental and somatic dysfunction of cervical region: Secondary | ICD-10-CM | POA: Diagnosis not present

## 2021-03-26 DIAGNOSIS — M9902 Segmental and somatic dysfunction of thoracic region: Secondary | ICD-10-CM | POA: Diagnosis not present

## 2021-03-26 DIAGNOSIS — M9904 Segmental and somatic dysfunction of sacral region: Secondary | ICD-10-CM | POA: Diagnosis not present

## 2021-03-28 DIAGNOSIS — D225 Melanocytic nevi of trunk: Secondary | ICD-10-CM | POA: Diagnosis not present

## 2021-03-28 DIAGNOSIS — M9901 Segmental and somatic dysfunction of cervical region: Secondary | ICD-10-CM | POA: Diagnosis not present

## 2021-03-28 DIAGNOSIS — M9903 Segmental and somatic dysfunction of lumbar region: Secondary | ICD-10-CM | POA: Diagnosis not present

## 2021-03-28 DIAGNOSIS — M9904 Segmental and somatic dysfunction of sacral region: Secondary | ICD-10-CM | POA: Diagnosis not present

## 2021-03-28 DIAGNOSIS — M9902 Segmental and somatic dysfunction of thoracic region: Secondary | ICD-10-CM | POA: Diagnosis not present

## 2021-03-28 DIAGNOSIS — C44311 Basal cell carcinoma of skin of nose: Secondary | ICD-10-CM | POA: Diagnosis not present

## 2021-03-28 DIAGNOSIS — L570 Actinic keratosis: Secondary | ICD-10-CM | POA: Diagnosis not present

## 2021-04-08 DIAGNOSIS — M9902 Segmental and somatic dysfunction of thoracic region: Secondary | ICD-10-CM | POA: Diagnosis not present

## 2021-04-08 DIAGNOSIS — M9901 Segmental and somatic dysfunction of cervical region: Secondary | ICD-10-CM | POA: Diagnosis not present

## 2021-04-08 DIAGNOSIS — M9903 Segmental and somatic dysfunction of lumbar region: Secondary | ICD-10-CM | POA: Diagnosis not present

## 2021-04-08 DIAGNOSIS — M9904 Segmental and somatic dysfunction of sacral region: Secondary | ICD-10-CM | POA: Diagnosis not present

## 2021-04-11 DIAGNOSIS — M9902 Segmental and somatic dysfunction of thoracic region: Secondary | ICD-10-CM | POA: Diagnosis not present

## 2021-04-11 DIAGNOSIS — M9901 Segmental and somatic dysfunction of cervical region: Secondary | ICD-10-CM | POA: Diagnosis not present

## 2021-04-11 DIAGNOSIS — M9903 Segmental and somatic dysfunction of lumbar region: Secondary | ICD-10-CM | POA: Diagnosis not present

## 2021-04-11 DIAGNOSIS — M9904 Segmental and somatic dysfunction of sacral region: Secondary | ICD-10-CM | POA: Diagnosis not present

## 2021-04-15 DIAGNOSIS — M9902 Segmental and somatic dysfunction of thoracic region: Secondary | ICD-10-CM | POA: Diagnosis not present

## 2021-04-15 DIAGNOSIS — M9903 Segmental and somatic dysfunction of lumbar region: Secondary | ICD-10-CM | POA: Diagnosis not present

## 2021-04-15 DIAGNOSIS — M9901 Segmental and somatic dysfunction of cervical region: Secondary | ICD-10-CM | POA: Diagnosis not present

## 2021-04-15 DIAGNOSIS — M9904 Segmental and somatic dysfunction of sacral region: Secondary | ICD-10-CM | POA: Diagnosis not present

## 2021-04-22 DIAGNOSIS — M9904 Segmental and somatic dysfunction of sacral region: Secondary | ICD-10-CM | POA: Diagnosis not present

## 2021-04-22 DIAGNOSIS — M9903 Segmental and somatic dysfunction of lumbar region: Secondary | ICD-10-CM | POA: Diagnosis not present

## 2021-04-22 DIAGNOSIS — M9902 Segmental and somatic dysfunction of thoracic region: Secondary | ICD-10-CM | POA: Diagnosis not present

## 2021-04-22 DIAGNOSIS — M9901 Segmental and somatic dysfunction of cervical region: Secondary | ICD-10-CM | POA: Diagnosis not present

## 2021-04-23 ENCOUNTER — Telehealth: Payer: Self-pay | Admitting: Pharmacist

## 2021-04-23 NOTE — Progress Notes (Addendum)
Chronic Care Management Pharmacy Assistant    Name: Taylor Harvey.  MRN: [id number] DOB: 1950/09/03   Reason for Encounter: Monthly Medication Coordination Call   Recent office visits:  None noted.   Recent consult visits:  None noted.   Hospital visits:  None in previous 6 months    Medications: Outpatient Encounter Medications as of 04/23/2021  Medication Sig   ALPRAZolam (XANAX) 0.5 MG tablet Take 1 tablet (0.5 mg total) by mouth 2 (two) times daily as needed for anxiety.   aspirin 81 MG tablet Take 81 mg by mouth daily.   citalopram (CELEXA) 40 MG tablet Take 1 tablet (40 mg total) by mouth daily.   esomeprazole (NEXIUM) 40 MG capsule Take 1 capsule (40 mg total) by mouth every morning.   fluticasone-salmeterol (ADVAIR DISKUS) 250-50 MCG/ACT AEPB Inhale 1 puff into the lungs 2 (two) times daily. INHALE 1 PUFF INTO LUNGS TWICE DAILY   HYDROcodone-acetaminophen (NORCO/VICODIN) 5-325 MG tablet Take 1 tablet by mouth every 6 (six) hours as needed for moderate pain.   meclizine (ANTIVERT) 25 MG tablet TAKE 1 TABLET BY MOUTH THREE TIMES DAILY AS NEEDED FOR DIZZINESS   ondansetron (ZOFRAN) 4 MG tablet TAKE 1 TABLET BY MOUTH EVERY 8 HOURS AS NEEDED FOR NAUSEA OR VOMITING   simvastatin (ZOCOR) 10 MG tablet TAKE ONE TABLET BY MOUTH EVERYDAY AT BEDTIME   telmisartan (MICARDIS) 80 MG tablet Take 1 tablet (80 mg total) by mouth every morning.   tiZANidine (ZANAFLEX) 4 MG tablet Take 1 tablet (4 mg total) by mouth every 8 (eight) hours as needed for muscle spasms. (Patient not taking: Reported on 03/07/2021)   traZODone (DESYREL) 50 MG tablet TAKE ONE-HALF TO 1 TABLET BY MOUTH AT bedtime AS NEEDED FOR SLEEP   No facility-administered encounter medications on file as of 04/23/2021.   Reviewed chart for medication changes ahead of medication coordination call.  No OVs, Consults, or hospital visits since last care coordination call/Pharmacist visit. (If appropriate, list visit  date, provider name)  No medication changes indicated OR if recent visit, treatment plan here.  BP Readings from Last 3 Encounters:  03/07/21 130/68  02/05/21 (!) 150/80  01/01/21 105/66    Lab Results  Component Value Date   HGBA1C 6.3 04/14/2019     Patient obtains medications through Adherence Packaging  30 Days   Last adherence delivery included: (medication name and frequency)  Citalopram 40 mg; one every morning Esomeprazole 40 mg; one every morning Simvastatin 10 mg; one at bedtime Telmisartan 80 mg; one every morning Advair 250-50 mcg Trazodone 50mg  1 tablet at bedtime  Patient is due for next adherence delivery on: 05/02/21. Called patient and reviewed medications and coordinated delivery.  This delivery to include:  Citalopram 40 mg; one every morning Esomeprazole 40 mg; one every morning Simvastatin 10 mg; one at bedtime Telmisartan 80 mg; one every morning Trazodone 50mg  1 tablet at bedtime   Patient declined the following medications (meds) due to (reason) Advair 250-50 mcg (reported does not need at this time)  Patient needs refills for None.  Confirmed delivery date of 05/02/21, advised patient that pharmacy will contact them the morning of delivery.   Care Gaps   AWV: done 04/30/20  (due 04/30/21) Colonoscopy: done 08/13/16 (due 08/13/21) DM Eye Exam: N/A DM Foot Exam: N/A Microalbumin: N/A HbgAIC: done 04/14/19 (6.3) DEXA: never Mammogram: N/A     Star Rating Drugs: Telmisartan (MICARDIS) 80 MG tablet - last filled 03/27/21  30 days  Simvastatin (ZOCOR) 10 MG tablet - last filled 03/27/21 30 days   Future Appointments  Date Time Provider [place]  09/05/2021  8:30 AM Midge Minium, MD [facility] [place], [place] Pharmacist Assistant  [phone]  9 minutes spent in review, coordination, and documentation.  Reviewed by: Beverly Milch, PharmD Clinical Pharmacist [phone]

## 2021-05-09 ENCOUNTER — Encounter: Payer: Self-pay | Admitting: Orthopaedic Surgery

## 2021-05-09 ENCOUNTER — Other Ambulatory Visit: Payer: Self-pay | Admitting: Orthopaedic Surgery

## 2021-05-09 ENCOUNTER — Other Ambulatory Visit: Payer: Self-pay | Admitting: Registered Nurse

## 2021-05-09 DIAGNOSIS — N200 Calculus of kidney: Secondary | ICD-10-CM

## 2021-05-09 MED ORDER — DICLOFENAC SODIUM 75 MG PO TBEC: 75.0000 mg | DELAYED_RELEASE_TABLET | Freq: Two times a day (BID) | ORAL | 1 refills | Status: DC | PRN

## 2021-05-09 MED ORDER — HYDROCODONE-ACETAMINOPHEN 5-325 MG PO TABS: 1.0000 | ORAL_TABLET | Freq: Four times a day (QID) | ORAL | 0 refills | Status: DC | PRN

## 2021-05-09 NOTE — Telephone Encounter (Signed)
Patient is requesting a refill of the following medications: Requested Prescriptions   Pending Prescriptions Disp Refills   HYDROcodone-acetaminophen (NORCO/VICODIN) 5-325 MG tablet 30 tablet 0    Sig: Take 1 tablet by mouth every 6 (six) hours as needed for moderate pain.    Date of patient request: 05/09/2021 Last office visit: 03/07/2021 Date of last refill: 01/15/2021 Last refill amount: 30 tablets  Follow up time period per chart: 09/05/2021

## 2021-05-21 ENCOUNTER — Telehealth: Payer: Self-pay | Admitting: Family Medicine

## 2021-05-21 ENCOUNTER — Other Ambulatory Visit: Payer: Self-pay | Admitting: Family Medicine

## 2021-05-21 MED ORDER — MECLIZINE HCL 25 MG PO TABS: ORAL_TABLET | ORAL | 0 refills | Status: DC

## 2021-05-21 MED ORDER — ONDANSETRON HCL 4 MG PO TABS: 4.0000 mg | ORAL_TABLET | Freq: Three times a day (TID) | ORAL | 0 refills | Status: DC | PRN

## 2021-05-21 NOTE — Telephone Encounter (Signed)
Patient is requesting a refill of the following medications: Requested Prescriptions   Pending Prescriptions Disp Refills   meclizine (ANTIVERT) 25 MG tablet 45 tablet 0    Sig: TAKE 1 TABLET BY MOUTH THREE TIMES DAILY AS NEEDED FOR DIZZINESS   ondansetron (ZOFRAN) 4 MG tablet 20 tablet 0    Sig: Take 1 tablet (4 mg total) by mouth every 8 (eight) hours as needed.    Date of patient request: 05/21/2021 Last office visit:  Date of last refill: 03/25/2021 Last refill amount: 30 tablets Follow up time period per chart: 09/05/2021

## 2021-05-21 NOTE — Telephone Encounter (Signed)
Left message for patient to call back and schedule Medicare Annual Wellness Visit (AWV) in office.   If not able to come in office, please offer to do virtually or by telephone.  Left office number and my jabber [phone].  Last AWV:04/30/2020  Please schedule at anytime with Nurse Health Advisor.

## 2021-05-22 ENCOUNTER — Telehealth: Payer: Self-pay | Admitting: Pharmacist

## 2021-05-22 NOTE — Progress Notes (Addendum)
Chronic Care Management Pharmacy Assistant   Name: Taylor Harvey.  MRN: [id number] DOB: 03/24/1951   Reason for Encounter: Monthly Medication Coordination Call     Medications: Outpatient Encounter Medications as of 05/22/2021  Medication Sig   diclofenac (VOLTAREN) 75 MG EC tablet Take 1 tablet (75 mg total) by mouth 2 (two) times daily between meals as needed.   ALPRAZolam (XANAX) 0.5 MG tablet Take 1 tablet (0.5 mg total) by mouth 2 (two) times daily as needed for anxiety.   aspirin 81 MG tablet Take 81 mg by mouth daily.   citalopram (CELEXA) 40 MG tablet Take 1 tablet (40 mg total) by mouth daily.   esomeprazole (NEXIUM) 40 MG capsule Take 1 capsule (40 mg total) by mouth every morning.   fluticasone-salmeterol (ADVAIR DISKUS) 250-50 MCG/ACT AEPB Inhale 1 puff into the lungs 2 (two) times daily. INHALE 1 PUFF INTO LUNGS TWICE DAILY   HYDROcodone-acetaminophen (NORCO/VICODIN) 5-325 MG tablet Take 1 tablet by mouth every 6 (six) hours as needed for moderate pain.   meclizine (ANTIVERT) 25 MG tablet TAKE 1 TABLET BY MOUTH THREE TIMES DAILY AS NEEDED FOR DIZZINESS   ondansetron (ZOFRAN) 4 MG tablet Take 1 tablet (4 mg total) by mouth every 8 (eight) hours as needed.   simvastatin (ZOCOR) 10 MG tablet TAKE ONE TABLET BY MOUTH EVERYDAY AT BEDTIME   telmisartan (MICARDIS) 80 MG tablet Take 1 tablet (80 mg total) by mouth every morning.   tiZANidine (ZANAFLEX) 4 MG tablet Take 1 tablet (4 mg total) by mouth every 8 (eight) hours as needed for muscle spasms. (Patient not taking: Reported on 03/07/2021)   traZODone (DESYREL) 50 MG tablet TAKE ONE-HALF TO 1 TABLET BY MOUTH AT bedtime AS NEEDED FOR SLEEP   No facility-administered encounter medications on file as of 05/22/2021.   Reviewed chart for medication changes ahead of medication coordination call.  No OVs, Consults, or hospital visits since last care coordination call/Pharmacist visit. (If appropriate, list visit date,  provider name)  No medication changes indicated OR if recent visit, treatment plan here.  BP Readings from Last 3 Encounters:  03/07/21 130/68  02/05/21 (!) 150/80  01/01/21 105/66    Lab Results  Component Value Date   HGBA1C 6.3 04/14/2019     Patient obtains medications through Adherence Packaging  30 Days   Last adherence delivery included: (medication name and frequency)  Citalopram 40 mg; one every morning Esomeprazole 40 mg; one every morning Simvastatin 10 mg; one at bedtime Telmisartan 80 mg; one every morning Trazodone 50mg  1 tablet at bedtime   Patient is due for next adherence delivery on: 06/03/21. Called patient and reviewed medications and coordinated delivery.  This delivery to include:  Citalopram 40 mg; one every morning Esomeprazole 40 mg; one every morning Simvastatin 10 mg; one at bedtime Telmisartan 80 mg; one every morning Trazodone 50mg  1 tablet at bedtime Advair 250/50 1 puff into lungs twice daily  Patient needs refills for : Esomeprazole 40 mg one every morning Simvastatin 10 mg one at bedtime Telmisartan 80 mg one every morning for 30 day supply.  Confirmed delivery date of 06/03/21, advised patient that pharmacy will contact them the morning of delivery.   Care Gaps  AWV 05/21/21 Colonoscopy: done 08/13/16 (due 08/13/21) DM Eye Exam: N/A DM Foot Exam: N/A Microalbumin: N/A HbgAIC: done 04/14/19 (6.3) DEXA: never Mammogram: N/A  Star Rating Drugs: Telmisartan (MICARDIS) 80 MG tablet - last filled 04/25/21  30 days  Simvastatin (ZOCOR)  10 MG tablet - last filled 04/25/21 30 days   Future Appointments  Date Time Provider [place]  09/05/2021  8:30 AM Midge Minium, MD [facility] [place], [place] Pharmacist Assistant  [phone]  9 minutes spent in review, coordination, and documentation.  Reviewed by: Beverly Milch, PharmD Clinical Pharmacist [phone]

## 2021-05-28 ENCOUNTER — Other Ambulatory Visit: Payer: Self-pay | Admitting: Family Medicine

## 2021-05-28 DIAGNOSIS — E785 Hyperlipidemia, unspecified: Secondary | ICD-10-CM

## 2021-05-30 ENCOUNTER — Telehealth: Payer: Self-pay | Admitting: Pharmacist

## 2021-05-30 NOTE — Progress Notes (Signed)
° ° °  Chronic Care Management Pharmacy Assistant   Name: Taylor Harvey.  MRN: [id number] DOB: 1951/04/21   Reason for Encounter: Disease State - General Adherence Call   Recent office visits:  None noted  Recent consult visits:  None noted  Hospital visits:  None in previous 6 months  Medications: Outpatient Encounter Medications as of 05/30/2021  Medication Sig   diclofenac (VOLTAREN) 75 MG EC tablet Take 1 tablet (75 mg total) by mouth 2 (two) times daily between meals as needed.   ADVAIR DISKUS 250-50 MCG/ACT AEPB Inhale ONE PUFF into THE lungs twice daily   ALPRAZolam (XANAX) 0.5 MG tablet Take 1 tablet (0.5 mg total) by mouth 2 (two) times daily as needed for anxiety.   aspirin 81 MG tablet Take 81 mg by mouth daily.   citalopram (CELEXA) 40 MG tablet Take 1 tablet (40 mg total) by mouth daily.   esomeprazole (NEXIUM) 40 MG capsule TAKE ONE CAPSULE BY MOUTH EVERY MORNING   HYDROcodone-acetaminophen (NORCO/VICODIN) 5-325 MG tablet Take 1 tablet by mouth every 6 (six) hours as needed for moderate pain.   meclizine (ANTIVERT) 25 MG tablet TAKE 1 TABLET BY MOUTH THREE TIMES DAILY AS NEEDED FOR DIZZINESS   ondansetron (ZOFRAN) 4 MG tablet Take 1 tablet (4 mg total) by mouth every 8 (eight) hours as needed.   simvastatin (ZOCOR) 10 MG tablet TAKE ONE TABLET BY MOUTH EVERYDAY AT BEDTIME   telmisartan (MICARDIS) 80 MG tablet TAKE ONE TABLET BY MOUTH EVERY MORNING   tiZANidine (ZANAFLEX) 4 MG tablet Take 1 tablet (4 mg total) by mouth every 8 (eight) hours as needed for muscle spasms. (Patient not taking: Reported on 03/07/2021)   traZODone (DESYREL) 50 MG tablet TAKE ONE-HALF TO 1 TABLET BY MOUTH AT bedtime AS NEEDED FOR SLEEP   No facility-administered encounter medications on file as of 05/30/2021.   Have you had any problems recently with your health? Patient denied any new problem with is health recently.   Have you had any problems with your pharmacy? Patient denied any  issue with his current pharmacy.   What issues or side effects are you having with your medications? Patient side effects or issues with his medications other than his Advair copay went up this month.   What would you like me to pass along to Leata Mouse, CPP for them to help you with?  Patients Advair copay went up this month but patient states he has enough to get through this month and has new insurance for this year and will wait and see what his copay is next month with his new insurance.  What can we do to take care of you better? Patient did not have any recommendations at this time.    Care Gaps   AWV 05/21/21 Colonoscopy: done 08/13/16 (due 08/13/21) DM Eye Exam: N/A DM Foot Exam: N/A Microalbumin: N/A HbgAIC: done 04/14/19 (6.3) DEXA: never Mammogram: N/A  Star Rating Drugs: Telmisartan (MICARDIS) 80 MG tablet - last filled 04/25/21  30 days  Simvastatin (ZOCOR) 10 MG tablet - last filled 04/25/21 30 days   Future Appointments  Date Time Provider [place]  09/05/2021  8:30 AM Midge Minium, MD [facility] [place], [place] Pharmacist Assistant  [phone]

## 2021-06-20 ENCOUNTER — Telehealth: Payer: Self-pay | Admitting: Pharmacist

## 2021-06-20 NOTE — Progress Notes (Addendum)
Chronic Care Management Pharmacy Assistant   Name: Tramain Gershman.  MRN: [id number] DOB: May 20, 1951   Reason for Encounter: Monthly Medication Coordination Call      Medications: Outpatient Encounter Medications as of 06/20/2021  Medication Sig   diclofenac (VOLTAREN) 75 MG EC tablet Take 1 tablet (75 mg total) by mouth 2 (two) times daily between meals as needed.   ADVAIR DISKUS 250-50 MCG/ACT AEPB Inhale ONE PUFF into THE lungs twice daily   ALPRAZolam (XANAX) 0.5 MG tablet Take 1 tablet (0.5 mg total) by mouth 2 (two) times daily as needed for anxiety.   aspirin 81 MG tablet Take 81 mg by mouth daily.   citalopram (CELEXA) 40 MG tablet Take 1 tablet (40 mg total) by mouth daily.   esomeprazole (NEXIUM) 40 MG capsule TAKE ONE CAPSULE BY MOUTH EVERY MORNING   HYDROcodone-acetaminophen (NORCO/VICODIN) 5-325 MG tablet Take 1 tablet by mouth every 6 (six) hours as needed for moderate pain.   meclizine (ANTIVERT) 25 MG tablet TAKE 1 TABLET BY MOUTH THREE TIMES DAILY AS NEEDED FOR DIZZINESS   ondansetron (ZOFRAN) 4 MG tablet Take 1 tablet (4 mg total) by mouth every 8 (eight) hours as needed.   simvastatin (ZOCOR) 10 MG tablet TAKE ONE TABLET BY MOUTH EVERYDAY AT BEDTIME   telmisartan (MICARDIS) 80 MG tablet TAKE ONE TABLET BY MOUTH EVERY MORNING   tiZANidine (ZANAFLEX) 4 MG tablet Take 1 tablet (4 mg total) by mouth every 8 (eight) hours as needed for muscle spasms. (Patient not taking: Reported on 03/07/2021)   traZODone (DESYREL) 50 MG tablet TAKE ONE-HALF TO 1 TABLET BY MOUTH AT bedtime AS NEEDED FOR SLEEP   No facility-administered encounter medications on file as of 06/20/2021.    Reviewed chart for medication changes ahead of medication coordination call.  No OVs, Consults, or hospital visits since last care coordination call/Pharmacist visit. (If appropriate, list visit date, provider name)  No medication changes indicated OR if recent visit, treatment plan here.  BP  Readings from Last 3 Encounters:  03/07/21 130/68  02/05/21 (!) 150/80  01/01/21 105/66    Lab Results  Component Value Date   HGBA1C 6.3 04/14/2019     Patient obtains medications through Adherence Packaging  30 Days   Last adherence delivery included: (medication name and frequency)  Citalopram 40 mg; one every morning Esomeprazole 40 mg; one every morning Simvastatin 10 mg; one at bedtime Telmisartan 80 mg; one every morning Trazodone 50mg  1 tablet at bedtime  Patient is due for next adherence delivery on: 07/02/21. Called patient and reviewed medications and coordinated delivery.  This delivery to include:  Citalopram 40 mg; one every morning Esomeprazole 40 mg; one every morning Simvastatin 10 mg; one at bedtime Telmisartan 80 mg; one every morning Trazodone 50mg  1 tablet at bedtime Diclofenac Sodium 75mg  1 tablet twice daily as needed  Patient needs refills OZH:YQMVHQION 50 mg and Citalopram 40mg  for 30 day supply  Confirmed delivery date of 07/02/21, advised patient that pharmacy will contact them the morning of delivery.   Care Gaps   AWV 05/21/21 Colonoscopy: done 08/13/16 (due 08/13/21) DM Eye Exam: N/A DM Foot Exam: N/A Microalbumin: N/A HbgAIC: done 04/14/19 (6.3) DEXA: never Mammogram: N/A  Star Rating Drugs: Telmisartan (MICARDIS) 80 MG tablet - last filled 04/25/21  30 days  Simvastatin (ZOCOR) 10 MG tablet - last filled 04/25/21 30 days  Future Appointments  Date Time Provider [place]  09/12/2021  8:00 AM Midge Minium, MD  [facility] [place], [place] Pharmacist Assistant  [phone]   10 minutes spent in review, coordination, and documentation.  Reviewed by: Beverly Milch, PharmD Clinical Pharmacist [phone]

## 2021-06-26 ENCOUNTER — Other Ambulatory Visit: Payer: Self-pay | Admitting: Family Medicine

## 2021-06-26 DIAGNOSIS — F329 Major depressive disorder, single episode, unspecified: Secondary | ICD-10-CM

## 2021-06-28 ENCOUNTER — Encounter: Payer: Self-pay | Admitting: Family Medicine

## 2021-07-22 ENCOUNTER — Telehealth: Payer: Self-pay | Admitting: Pharmacist

## 2021-07-22 NOTE — Progress Notes (Addendum)
Chronic Care Management Pharmacy Assistant   Name: Taylor Harvey.  MRN: [id number] DOB: 10/29/1950  Reason for Encounter: Monthly Medication Coordination Call     Medications: Outpatient Encounter Medications as of 07/22/2021  Medication Sig   diclofenac (VOLTAREN) 75 MG EC tablet Take 1 tablet (75 mg total) by mouth 2 (two) times daily between meals as needed.   ADVAIR DISKUS 250-50 MCG/ACT AEPB Inhale ONE PUFF into THE lungs twice daily   ALPRAZolam (XANAX) 0.5 MG tablet Take 1 tablet (0.5 mg total) by mouth 2 (two) times daily as needed for anxiety.   aspirin 81 MG tablet Take 81 mg by mouth daily.   citalopram (CELEXA) 40 MG tablet TAKE ONE TABLET BY MOUTH ONCE DAILY   esomeprazole (NEXIUM) 40 MG capsule TAKE ONE CAPSULE BY MOUTH EVERY MORNING   HYDROcodone-acetaminophen (NORCO/VICODIN) 5-325 MG tablet Take 1 tablet by mouth every 6 (six) hours as needed for moderate pain.   meclizine (ANTIVERT) 25 MG tablet TAKE 1 TABLET BY MOUTH THREE TIMES DAILY AS NEEDED FOR DIZZINESS   ondansetron (ZOFRAN) 4 MG tablet Take 1 tablet (4 mg total) by mouth every 8 (eight) hours as needed.   simvastatin (ZOCOR) 10 MG tablet TAKE ONE TABLET BY MOUTH EVERYDAY AT BEDTIME   telmisartan (MICARDIS) 80 MG tablet TAKE ONE TABLET BY MOUTH EVERY MORNING   tiZANidine (ZANAFLEX) 4 MG tablet Take 1 tablet (4 mg total) by mouth every 8 (eight) hours as needed for muscle spasms. (Patient not taking: Reported on 03/07/2021)   traZODone (DESYREL) 50 MG tablet TAKE 1/2 TO 1 TABLET BY MOUTH everday AT bedtime   No facility-administered encounter medications on file as of 07/22/2021.    Reviewed chart for medication changes ahead of medication coordination call.  No OVs, Consults, or hospital visits since last care coordination call/Pharmacist visit. (If appropriate, list visit date, provider name)  No medication changes indicated OR if recent visit, treatment plan here.  BP Readings from Last 3  Encounters:  03/07/21 130/68  02/05/21 (!) 150/80  01/01/21 105/66    Lab Results  Component Value Date   HGBA1C 6.3 04/14/2019     Patient obtains medications through Adherence Packaging  30 Days   Last adherence delivery included: (medication name and frequency)  Citalopram 40 mg; one every morning Esomeprazole 40 mg; one every morning Simvastatin 10 mg; one at bedtime Telmisartan 80 mg; one every morning Trazodone 50mg  1 tablet at bedtime Diclofenac Sodium 75mg  1 tablet twice daily as needed   Patient is due for next adherence delivery on: 08/01/21. Called patient and reviewed medications and coordinated delivery.  This delivery to include:  Citalopram 40 mg; one every morning Esomeprazole 40 mg; one every morning Simvastatin 10 mg; one at bedtime Telmisartan 80 mg; one every morning Diclofenac Sodium 75mg  1 tablet twice daily as needed Advair 250/50 1 puff twice daily. (Phamracy to call pt if not covered by insurance or expensive)  Patient declined Trazodone 50mg  1 tablet at bedtime (has plenty on hand)   Patient needs refills for: Diclofenac Sodium 75mg  1 tablet twice daily as needed and Advair 250/50 for 30 day supply.   Confirmed delivery date of 08/01/21, advised patient that pharmacy will contact them the morning of delivery.   Care Gaps   AWV 05/21/21 Colonoscopy: done 08/13/16 (due 08/13/21) DM Eye Exam: N/A DM Foot Exam: N/A Microalbumin: N/A HbgAIC: done 04/14/19 (6.3) DEXA: never Mammogram: N/A   Star Rating Drugs: Telmisartan (MICARDIS) 80 MG tablet -  last filled 06/26/21  30 days  Simvastatin (ZOCOR) 10 MG tablet - last filled 06/26/21 30 days   Future Appointments  Date Time Provider [place]  09/12/2021  8:00 AM Midge Minium, MD [facility] [place], [place] Pharmacist Assistant  [phone]  10 minutes spent in review, coordination, and documentation.  Reviewed by: Beverly Milch, PharmD Clinical  Pharmacist [phone]

## 2021-07-25 ENCOUNTER — Other Ambulatory Visit: Payer: Self-pay | Admitting: Family Medicine

## 2021-07-26 ENCOUNTER — Other Ambulatory Visit: Payer: Self-pay | Admitting: Family Medicine

## 2021-07-26 ENCOUNTER — Other Ambulatory Visit: Payer: Self-pay | Admitting: Orthopaedic Surgery

## 2021-08-21 ENCOUNTER — Telehealth: Payer: Self-pay | Admitting: Pharmacist

## 2021-08-21 NOTE — Progress Notes (Signed)
? ? ?Chronic Care Management ?Pharmacy Assistant  ? ?Name: Taylor Harvey.  MRN: [id number] DOB: 01-01-1951 ? ? ?Reason for Encounter: Monthly Medication Coordination Call  ?  ? ? ?Medications: ?Outpatient Encounter Medications as of 08/21/2021  ?Medication Sig  ? ALPRAZolam (XANAX) 0.5 MG tablet Take 1 tablet (0.5 mg total) by mouth 2 (two) times daily as needed for anxiety.  ? aspirin 81 MG tablet Take 81 mg by mouth daily.  ? citalopram (CELEXA) 40 MG tablet TAKE ONE TABLET BY MOUTH ONCE DAILY  ? diclofenac (VOLTAREN) 75 MG EC tablet TAKE ONE TABLET BY MOUTH twice daily between meals AS NEEDED  ? esomeprazole (NEXIUM) 40 MG capsule TAKE ONE CAPSULE BY MOUTH EVERY MORNING  ? fluticasone-salmeterol (ADVAIR) 250-50 MCG/ACT AEPB INHALE 1 PUFF BY MOUTH INTO LUNGS twice daily  ? HYDROcodone-acetaminophen (NORCO/VICODIN) 5-325 MG tablet Take 1 tablet by mouth every 6 (six) hours as needed for moderate pain.  ? meclizine (ANTIVERT) 25 MG tablet TAKE 1 TABLET BY MOUTH THREE TIMES DAILY AS NEEDED FOR DIZZINESS  ? ondansetron (ZOFRAN) 4 MG tablet TAKE 1 TABLET BY MOUTH EVERY 8 HOURS AS NEEDED  ? simvastatin (ZOCOR) 10 MG tablet TAKE ONE TABLET BY MOUTH EVERYDAY AT BEDTIME  ? telmisartan (MICARDIS) 80 MG tablet TAKE ONE TABLET BY MOUTH EVERY MORNING  ? tiZANidine (ZANAFLEX) 4 MG tablet Take 1 tablet (4 mg total) by mouth every 8 (eight) hours as needed for muscle spasms. (Patient not taking: Reported on 03/07/2021)  ? traZODone (DESYREL) 50 MG tablet TAKE 1/2 TO 1 TABLET BY MOUTH everday AT bedtime  ? ?No facility-administered encounter medications on file as of 08/21/2021.  ? ? ?Reviewed chart for medication changes ahead of medication coordination call. ? ?No OVs, Consults, or hospital visits since last care coordination call/Pharmacist visit. (If appropriate, list visit date, provider name) ? ?No medication changes indicated OR if recent visit, treatment plan here. ? ?BP Readings from Last 3 Encounters:  ?03/07/21  130/68  ?02/05/21 (!) 150/80  ?01/01/21 105/66  ?  ?Lab Results  ?Component Value Date  ? HGBA1C 6.3 04/14/2019  ?  ? ?Patient obtains medications through Adherence Packaging  30 Days  ? ? ?Last adherence delivery included: (medication name and frequency) ? ?Citalopram 40 mg; one every morning ?Esomeprazole 40 mg; one every morning ?Simvastatin 10 mg; one at bedtime ?Telmisartan 80 mg; one every morning ?Diclofenac Sodium '75mg'$  1 tablet twice daily as needed ?Advair 250/50 1 puff twice daily. (Phamracy to call pt if not covered by insurance or expensive) ? ? ?Patient is due for next adherence delivery on: 09/02/21. ?Called patient and reviewed medications and coordinated delivery. ? ? ?This delivery to include: ? ?Citalopram 40 mg; one every morning ?Esomeprazole 40 mg; one every morning ?Simvastatin 10 mg; one at bedtime ?Telmisartan 80 mg; one every morning ?Diclofenac Sodium '75mg'$  1 tablet twice daily as needed ?Advair 250/50 1 puff twice daily (Phamracy to call pt if not covered by insurance or expensive) ? ? ? ?Patient declined the following medications (meds) due to (reason): (plenty on hand) ?Trazodone 50 mg 0.5-1 tablet at bedtime ? ? ?Patient needs refills for: Citalopram 40 mg; one every morning and Advair 250/50 1 puff twice daily for 30 day supply. ? ? ?Confirmed delivery date of 09/02/21, advised patient that pharmacy will contact them the morning of delivery. ? ? ?Care Gaps ?  ?AWV 05/21/21 ?Colonoscopy: done 08/13/16 (due 08/13/21) ?DM Eye Exam: N/A ?DM Foot Exam: N/A ?Microalbumin: N/A ?HbgAIC:  done 04/14/19 (6.3) ?DEXA: never ?Mammogram: N/A ?  ? ? ?Star Rating Drugs: ?Telmisartan (MICARDIS) 80 MG tablet - last filled 07/27/21  30 days  ?Simvastatin (ZOCOR) 10 MG tablet - last filled 07/27/21 30 days ? ? ? ?Future Appointments  ?Date Time Provider [place]  ?09/12/2021  8:00 AM Midge Minium, MD [facility]  ? ? ? ?[name], CCMA ?Clinical Pharmacist Assistant  ?[phone] ?  ?

## 2021-08-28 ENCOUNTER — Other Ambulatory Visit: Payer: Self-pay | Admitting: Family Medicine

## 2021-09-05 ENCOUNTER — Encounter: Payer: PPO | Admitting: Family Medicine

## 2021-09-12 ENCOUNTER — Ambulatory Visit (INDEPENDENT_AMBULATORY_CARE_PROVIDER_SITE_OTHER): Payer: PPO | Admitting: Family Medicine

## 2021-09-12 ENCOUNTER — Encounter: Payer: Self-pay | Admitting: Family Medicine

## 2021-09-12 VITALS — BP 124/70 | HR 63 | Temp 97.6°F | Resp 16 | Ht 67.5 in | Wt 248.4 lb

## 2021-09-12 DIAGNOSIS — Z1211 Encounter for screening for malignant neoplasm of colon: Secondary | ICD-10-CM | POA: Diagnosis not present

## 2021-09-12 DIAGNOSIS — Z0000 Encounter for general adult medical examination without abnormal findings: Secondary | ICD-10-CM | POA: Diagnosis not present

## 2021-09-12 DIAGNOSIS — Z125 Encounter for screening for malignant neoplasm of prostate: Secondary | ICD-10-CM | POA: Diagnosis not present

## 2021-09-12 LAB — BASIC METABOLIC PANEL
BUN: 17 mg/dL (ref 6–23)
CO2: 28 mEq/L (ref 19–32)
Calcium: 9.1 mg/dL (ref 8.4–10.5)
Chloride: 107 mEq/L (ref 96–112)
Creatinine, Ser: 1.15 mg/dL (ref 0.40–1.50)
GFR: 63.69 mL/min (ref >60.00)
Glucose, Bld: 106 mg/dL — ABNORMAL HIGH (ref 70–99)
Potassium: 4.5 mEq/L (ref 3.5–5.1)
Sodium: 142 mEq/L (ref 135–145)

## 2021-09-12 LAB — HEPATIC FUNCTION PANEL
ALT: 32 U/L (ref 0–53)
AST: 20 U/L (ref 0–37)
Albumin: 4.2 g/dL (ref 3.5–5.2)
Alkaline Phosphatase: 73 U/L (ref 39–117)
Bilirubin, Direct: 0.1 mg/dL (ref 0.0–0.3)
Total Bilirubin: 0.6 mg/dL (ref 0.2–1.2)
Total Protein: 6.3 g/dL (ref 6.0–8.3)

## 2021-09-12 LAB — CBC WITH DIFFERENTIAL/PLATELET
Basophils Absolute: 0 10*3/uL (ref 0.0–0.1)
Basophils Relative: 0.7 % (ref 0.0–3.0)
Eosinophils Absolute: 0.2 10*3/uL (ref 0.0–0.7)
Eosinophils Relative: 4.1 % (ref 0.0–5.0)
HCT: 39.9 % (ref 39.0–52.0)
Hemoglobin: 12.9 g/dL — ABNORMAL LOW (ref 13.0–17.0)
Lymphocytes Relative: 31.2 % (ref 12.0–46.0)
Lymphs Abs: 1.4 10*3/uL (ref 0.7–4.0)
MCHC: 32.4 g/dL (ref 30.0–36.0)
MCV: 92 fl (ref 78.0–100.0)
Monocytes Absolute: 0.5 10*3/uL (ref 0.1–1.0)
Monocytes Relative: 10.3 % (ref 3.0–12.0)
Neutro Abs: 2.4 10*3/uL (ref 1.4–7.7)
Neutrophils Relative %: 53.7 % (ref 43.0–77.0)
Platelets: 187 10*3/uL (ref 150.0–400.0)
RBC: 4.34 Mil/uL (ref 4.22–5.81)
RDW: 15.5 % (ref 11.5–15.5)
WBC: 4.5 10*3/uL (ref 4.0–10.5)

## 2021-09-12 LAB — PSA, MEDICARE: PSA: 0.15 ng/ml (ref 0.10–4.00)

## 2021-09-12 LAB — LIPID PANEL
Cholesterol: 118 mg/dL (ref 0–200)
HDL: 34.2 mg/dL — ABNORMAL LOW (ref >39.00)
LDL Cholesterol: 64 mg/dL (ref 0–99)
NonHDL: 83.41
Total CHOL/HDL Ratio: 3
Triglycerides: 96 mg/dL (ref 0.0–149.0)
VLDL: 19.2 mg/dL (ref 0.0–40.0)

## 2021-09-12 LAB — TSH: TSH: 1.63 u[IU]/mL (ref 0.35–5.50)

## 2021-09-12 NOTE — Assessment & Plan Note (Signed)
Pt's PE WNL w/ exception of obesity.  UTD on PNA, Tdap.  Due for repeat colonoscopy- referral placed.  Check labs.  Anticipatory guidance provided.  ?

## 2021-09-12 NOTE — Patient Instructions (Addendum)
Follow up in 6 months to recheck BP and cholesterol- sooner if needed ?We'll notify you of your lab results and make any changes if needed ?We'll call you with your GI consultation for repeat colonoscopy ?Continue to work on healthy diet and regular exercise- you can do it! ?Call with any questions or concerns ?Hang in there!!  You don't have to walk this alone!!! ?

## 2021-09-12 NOTE — Progress Notes (Signed)
? ?  Subjective:  ? ? Patient ID: Taylor Harvey., male    DOB: 16-May-1951, 71 y.o.   MRN: [id number] ? ?HPI ?CPE- due for repeat colonoscopy.  UTD on PNA vaccines, Tdap ? ?Patient Care Team  ?  Relationship Specialty Notifications Start End  ?Midge Minium, MD PCP - General Family Medicine  03/17/14   ? Comment: Merged  ?Doran Stabler, MD Consulting Physician Gastroenterology  07/21/16   ?Mcarthur Rossetti, MD Consulting Physician Orthopedic Surgery  07/23/17   ?Madelin Rear, [facility] Pharmacist Pharmacist  08/26/19   ? Comment: phone number [phone]  ?  ?Health Maintenance  ?Topic Date Due  ? COLONOSCOPY (Pts 45-43yr Insurance coverage will need to be confirmed)  08/13/2021  ? INFLUENZA VACCINE  12/24/2021  ? TETANUS/TDAP  09/21/2028  ? Pneumonia Vaccine 72 Years old  Completed  ? Hepatitis C Screening  Completed  ? HPV VACCINES  Aged Out  ? COVID-19 Vaccine  Discontinued  ? Zoster Vaccines- Shingrix  Discontinued  ?  ? ? ?Review of Systems ?Patient reports no vision/hearing changes, anorexia, fever ,adenopathy, persistant/recurrent hoarseness, swallowing issues, chest pain, palpitations, edema, persistant/recurrent cough, hemoptysis, dyspnea (rest,exertional, paroxysmal nocturnal), gastrointestinal  bleeding (melena, rectal bleeding), abdominal pain, excessive heart burn, GU symptoms (dysuria, hematuria, voiding/incontinence issues) syncope, focal weakness, memory loss, numbness & tingling, skin/hair/nail changes, abnormal bruising/bleeding, musculoskeletal symptoms/signs.  ? ?+ depression- wife died unexpectedly last month.  Has good support system- daughter calls daily, church friends. ?   ?Objective:  ? Physical Exam ?General Appearance:    Alert, cooperative, no distress, appears stated age  ?Head:    Normocephalic, without obvious abnormality, atraumatic  ?Eyes:    PERRL, conjunctiva/corneas clear, EOM's intact both eyes       ?Ears:    Normal TM's and external ear canals, both ears  ?Nose:    Nares normal, septum midline, mucosa normal, no drainage ?  or sinus tenderness  ?Throat:   Lips, mucosa, and tongue normal; teeth and gums normal  ?Neck:   Supple, symmetrical, trachea midline, no adenopathy;     ?  thyroid:  No enlargement/tenderness/nodules  ?Back:     Symmetric, no curvature, ROM normal, no CVA tenderness  ?Lungs:     Clear to auscultation bilaterally, respirations unlabored  ?Chest wall:    No tenderness or deformity  ?Heart:    Regular rate and rhythm, S1 and S2 normal, no murmur, rub ?  or gallop  ?Abdomen:     Soft, non-tender, bowel sounds active all four quadrants,  ?  no masses, no organomegaly  ?Genitalia:    deferred  ?Rectal:    ?Extremities:   Extremities normal, atraumatic, no cyanosis or edema  ?Pulses:   2+ and symmetric all extremities  ?Skin:   Skin color, texture, turgor normal, no rashes or lesions  ?Lymph nodes:   Cervical, supraclavicular, and axillary nodes normal  ?Neurologic:   CNII-XII intact. Normal strength, sensation and reflexes    ?  throughout  ?  ? ? ? ?   ?Assessment & Plan:  ? ? ?

## 2021-09-12 NOTE — Assessment & Plan Note (Signed)
Ongoing issue for pt.  BMI is 38.33 and coupled w/ his other medical issues, this qualifies as morbidly obese.  Encouraged low carb diet and regular exercise.  Check labs to risk stratify. ?

## 2021-09-18 ENCOUNTER — Other Ambulatory Visit: Payer: Self-pay | Admitting: Orthopaedic Surgery

## 2021-09-19 ENCOUNTER — Telehealth: Payer: Self-pay | Admitting: Pharmacist

## 2021-09-19 NOTE — Progress Notes (Signed)
? ? ?Chronic Care Management ?Pharmacy Assistant  ? ?Name: Taylor Harvey.  MRN: [id number] DOB: 08-26-1950 ? ? ?Reason for Encounter: Monthly Medication Coordination Call ?  ? ?Medications: ?Outpatient Encounter Medications as of 09/19/2021  ?Medication Sig  ? ALPRAZolam (XANAX) 0.5 MG tablet Take 1 tablet (0.5 mg total) by mouth 2 (two) times daily as needed for anxiety.  ? aspirin 81 MG tablet Take 81 mg by mouth daily.  ? citalopram (CELEXA) 40 MG tablet TAKE ONE TABLET BY MOUTH ONCE DAILY  ? diclofenac (VOLTAREN) 75 MG EC tablet TAKE ONE TABLET BY MOUTH twice daily between meals AS NEEDED  ? esomeprazole (NEXIUM) 40 MG capsule TAKE ONE CAPSULE BY MOUTH EVERY MORNING  ? fluticasone-salmeterol (ADVAIR) 250-50 MCG/ACT AEPB INHALE 1 PUFF BY MOUTH INTO LUNGS twice daily  ? HYDROcodone-acetaminophen (NORCO/VICODIN) 5-325 MG tablet Take 1 tablet by mouth every 6 (six) hours as needed for moderate pain.  ? meclizine (ANTIVERT) 25 MG tablet TAKE 1 TABLET BY MOUTH THREE TIMES DAILY AS NEEDED FOR DIZZINESS  ? ondansetron (ZOFRAN) 4 MG tablet TAKE 1 TABLET BY MOUTH EVERY 8 HOURS AS NEEDED  ? simvastatin (ZOCOR) 10 MG tablet TAKE ONE TABLET BY MOUTH EVERYDAY AT BEDTIME  ? telmisartan (MICARDIS) 80 MG tablet TAKE ONE TABLET BY MOUTH EVERY MORNING  ? traZODone (DESYREL) 50 MG tablet TAKE 1/2 TO 1 TABLET BY MOUTH everday AT bedtime  ? ?No facility-administered encounter medications on file as of 09/19/2021.  ? ? ?Reviewed chart for medication changes ahead of medication coordination call. ? ?No OVs, Consults, or hospital visits since last care coordination call/Pharmacist visit. (If appropriate, list visit date, provider name) ? ?No medication changes indicated OR if recent visit, treatment plan here. ? ?BP Readings from Last 3 Encounters:  ?09/12/21 124/70  ?03/07/21 130/68  ?02/05/21 (!) 150/80  ?  ?Lab Results  ?Component Value Date  ? HGBA1C 6.3 04/14/2019  ?  ? ?Patient obtains medications through Adherence Packaging   30 Days  ? ?Last adherence delivery included: (medication name and frequency) ? ?Citalopram 40 mg; one every morning ?Esomeprazole 40 mg; one every morning ?Simvastatin 10 mg; one at bedtime ?Telmisartan 80 mg; one every morning ?Diclofenac Sodium '75mg'$  1 tablet twice daily as needed ?Advair 250/50 1 puff twice daily. (Phamracy to call pt if not covered by insurance or expensive) ? ?Declined: Trazodone (had plenty on hand) ? ?Patient is due for next adherence delivery on: 10/01/21. ?Called patient and reviewed medications and coordinated delivery. ? ?This delivery to include: ? ?Citalopram 40 mg; one every morning ?Esomeprazole 40 mg; one every morning ?Simvastatin 10 mg; one at bedtime ?Telmisartan 80 mg; one every morning ?Diclofenac Sodium '75mg'$  1 tablet twice daily as needed ?Advair 250/50 1 puff twice daily. (Phamracy to call pt if not covered by insurance or expensive) ?Trazodone '50mg'$  1/2-1 tablet at bedtime as needed. ? ? ? ?Patient declined the following medications (meds) due to (reason) ? ?Patient needs refills from Specialist: Diclofenac Sodium '75mg'$  1 tablet twice daily as needed. (Dr Valerie Salts) ? ?Confirmed delivery date of 10/01/21, advised patient that pharmacy will contact them the morning of delivery. ? ? ?Care Gaps ?  ?AWV 05/21/21 ?Colonoscopy: done 08/13/16 (due 08/13/21) ?DM Eye Exam: N/A ?DM Foot Exam: N/A ?Microalbumin: N/A ?HbgAIC: done 04/14/19 (6.3) ?DEXA: never ?Mammogram: N/A ?  ?  ?Star Rating Drugs: ?Telmisartan (MICARDIS) 80 MG tablet - last filled 08/28/21  30 days  ?Simvastatin (ZOCOR) 10 MG tablet - last filled 08/28/21  30 days ? ? ?Future Appointments  ?Date Time Provider [place]  ?03/14/2022  8:20 AM Midge Minium, MD [facility]  ? ? ? ?[name], CCMA ?Clinical Pharmacist Assistant  ?[phone] ? ? ?

## 2021-09-26 DIAGNOSIS — B353 Tinea pedis: Secondary | ICD-10-CM | POA: Diagnosis not present

## 2021-09-26 DIAGNOSIS — L821 Other seborrheic keratosis: Secondary | ICD-10-CM | POA: Diagnosis not present

## 2021-09-26 DIAGNOSIS — B351 Tinea unguium: Secondary | ICD-10-CM | POA: Diagnosis not present

## 2021-09-26 DIAGNOSIS — L570 Actinic keratosis: Secondary | ICD-10-CM | POA: Diagnosis not present

## 2021-09-26 DIAGNOSIS — L578 Other skin changes due to chronic exposure to nonionizing radiation: Secondary | ICD-10-CM | POA: Diagnosis not present

## 2021-10-18 ENCOUNTER — Telehealth: Payer: Self-pay | Admitting: Pharmacist

## 2021-10-18 NOTE — Progress Notes (Signed)
Chronic Care Management Pharmacy Assistant   Name: Taylor Harvey.  MRN: [id number] DOB: 1951-01-30   Reason for Encounter: Monthly Medication Coordination     Medications: Outpatient Encounter Medications as of 10/18/2021  Medication Sig   ALPRAZolam (XANAX) 0.5 MG tablet Take 1 tablet (0.5 mg total) by mouth 2 (two) times daily as needed for anxiety.   aspirin 81 MG tablet Take 81 mg by mouth daily.   citalopram (CELEXA) 40 MG tablet TAKE ONE TABLET BY MOUTH ONCE DAILY   diclofenac (VOLTAREN) 75 MG EC tablet TAKE ONE TABLET BY MOUTH twice daily between meals AS NEEDED   esomeprazole (NEXIUM) 40 MG capsule TAKE ONE CAPSULE BY MOUTH EVERY MORNING   fluticasone-salmeterol (ADVAIR) 250-50 MCG/ACT AEPB INHALE 1 PUFF BY MOUTH INTO LUNGS twice daily   HYDROcodone-acetaminophen (NORCO/VICODIN) 5-325 MG tablet Take 1 tablet by mouth every 6 (six) hours as needed for moderate pain.   meclizine (ANTIVERT) 25 MG tablet TAKE 1 TABLET BY MOUTH THREE TIMES DAILY AS NEEDED FOR DIZZINESS   ondansetron (ZOFRAN) 4 MG tablet TAKE 1 TABLET BY MOUTH EVERY 8 HOURS AS NEEDED   simvastatin (ZOCOR) 10 MG tablet TAKE ONE TABLET BY MOUTH EVERYDAY AT BEDTIME   telmisartan (MICARDIS) 80 MG tablet TAKE ONE TABLET BY MOUTH EVERY MORNING   traZODone (DESYREL) 50 MG tablet TAKE 1/2 TO 1 TABLET BY MOUTH everday AT bedtime   No facility-administered encounter medications on file as of 10/18/2021.    Reviewed chart for medication changes ahead of medication coordination call.  No OVs, Consults, or hospital visits since last care coordination call/Pharmacist visit. (If appropriate, list visit date, provider name)  No medication changes indicated OR if recent visit, treatment plan here.  BP Readings from Last 3 Encounters:  09/12/21 124/70  03/07/21 130/68  02/05/21 (!) 150/80    Lab Results  Component Value Date   HGBA1C 6.3 04/14/2019     Patient obtains medications through Adherence Packaging  30  Days   Last adherence delivery included: (medication name and frequency) Citalopram 40 mg; one every morning Esomeprazole 40 mg; one every morning Simvastatin 10 mg; one at bedtime Telmisartan 80 mg; one every morning Diclofenac Sodium '75mg'$  1 tablet twice daily as needed Advair 250/50 1 puff twice daily. (Phamracy to call pt if not covered by insurance or expensive)   Patient declined Trazodone '50mg'$  1 tablet at bedtime (has plenty on hand)  Patient declined (meds) last month due to PRN use/additional supply on hand. Explanation of abundance on hand (ie #30 due to overlapping fills or previous adherence issues etc)  Patient is due for next adherence delivery on: 10/31/21. He requested his delivery be moved to 11/07/21 as he is out of town in [place] until 11/06/21. Called patient and reviewed medications and coordinated delivery.   This delivery to include: Citalopram 40 mg; one every morning Esomeprazole 40 mg; one every morning Simvastatin 10 mg; one at bedtime Telmisartan 80 mg; one every morning Diclofenac Sodium '75mg'$  1 tablet twice daily as needed Advair 250/50 1 puff twice daily.   Patient declined Trazodone '50mg'$  1 tablet at bedtime (has plenty on hand)   Patient needs refills from PCP for 30 day supply: Advair 250/50 1 puff twice daily and Citalopram 40 mg; one every morning  Confirmed delivery date of 10/31/21, advised patient that pharmacy will contact them the morning of delivery.   Care Gaps   AWV 05/21/21 Colonoscopy: done 08/13/16 (due 08/13/21) DM Eye Exam: N/A DM  Foot Exam: N/A Microalbumin: N/A HbgAIC: done 04/14/19 (6.3) DEXA: never Mammogram: N/A     Star Rating Drugs: Telmisartan (MICARDIS) 80 MG tablet - last filled 09/25/21  30 days  Simvastatin (ZOCOR) 10 MG tablet - last filled 09/25/21 30 days   Future Appointments  Date Time Provider [place]  10/30/2021  8:00 AM [facility]  11/11/2021 11:30 AM Danis, Kirke Corin, MD  [facility]  03/14/2022  8:20 AM Midge Minium, MD [facility] [place], [facility] Clinical Pharmacist Assistant  [phone]

## 2021-10-30 ENCOUNTER — Other Ambulatory Visit: Payer: Self-pay

## 2021-10-30 ENCOUNTER — Ambulatory Visit (AMBULATORY_SURGERY_CENTER): Payer: PPO | Admitting: *Deleted

## 2021-10-30 VITALS — Ht 67.0 in | Wt 250.0 lb

## 2021-10-30 DIAGNOSIS — Z800 Family history of malignant neoplasm of digestive organs: Secondary | ICD-10-CM

## 2021-10-30 MED ORDER — PEG 3350-KCL-NA BICARB-NACL 420 G PO SOLR: 4000.0000 mL | Freq: Once | ORAL | 0 refills | Status: AC

## 2021-10-30 NOTE — Progress Notes (Signed)
Virtual pre visit completed over telephone. Instructions forwarded through MyChart per patient request.  No egg or soy allergy known to patient  No issues known to pt with past sedation with any surgeries or procedures Patient denies ever being told they had issues or difficulty with intubation  No FH of Malignant Hyperthermia Pt is not on diet pills Pt is not on  home 02  Pt is not on blood thinners  Pt denies issues with constipation  No A fib or A flutter  PV completed over the phone. Pt verified name, DOB, address and insurance during PV today.  Pt mailed instruction packet with copy of consent form to read and not return, and instructions.  Pt encouraged to call with questions or issues.  If pt has My chart, procedure instructions sent via My Chart  Insurance confirmed with pt at [facility] today

## 2021-10-31 ENCOUNTER — Other Ambulatory Visit: Payer: Self-pay | Admitting: Family Medicine

## 2021-11-08 ENCOUNTER — Encounter: Payer: Self-pay | Admitting: Gastroenterology

## 2021-11-10 ENCOUNTER — Encounter: Payer: Self-pay | Admitting: Certified Registered Nurse Anesthetist

## 2021-11-11 ENCOUNTER — Encounter: Payer: PPO | Admitting: Gastroenterology

## 2021-11-12 ENCOUNTER — Telehealth: Payer: Self-pay | Admitting: Gastroenterology

## 2021-11-12 NOTE — Telephone Encounter (Signed)
Patient's procedure is tomorrow.  It was changed from a morning to 4 p.m. in the afternoon.  He needs instructions for the new time sent to his My Chart.  Thank you.

## 2021-11-12 NOTE — Telephone Encounter (Signed)
New instructions have been sent as requested.

## 2021-11-13 ENCOUNTER — Ambulatory Visit (AMBULATORY_SURGERY_CENTER): Payer: PPO | Admitting: Gastroenterology

## 2021-11-13 ENCOUNTER — Encounter: Payer: Self-pay | Admitting: Gastroenterology

## 2021-11-13 VITALS — BP 178/88 | HR 64 | Temp 97.3°F | Resp 14 | Ht 67.0 in | Wt 250.0 lb

## 2021-11-13 DIAGNOSIS — Z09 Encounter for follow-up examination after completed treatment for conditions other than malignant neoplasm: Secondary | ICD-10-CM

## 2021-11-13 DIAGNOSIS — Z86010 Personal history of colonic polyps: Secondary | ICD-10-CM | POA: Diagnosis not present

## 2021-11-13 DIAGNOSIS — Z1211 Encounter for screening for malignant neoplasm of colon: Secondary | ICD-10-CM | POA: Diagnosis not present

## 2021-11-13 DIAGNOSIS — Z800 Family history of malignant neoplasm of digestive organs: Secondary | ICD-10-CM

## 2021-11-13 DIAGNOSIS — I10 Essential (primary) hypertension: Secondary | ICD-10-CM | POA: Diagnosis not present

## 2021-11-13 DIAGNOSIS — G4733 Obstructive sleep apnea (adult) (pediatric): Secondary | ICD-10-CM | POA: Diagnosis not present

## 2021-11-13 MED ORDER — SODIUM CHLORIDE 0.9 % IV SOLN
500.0000 mL | Freq: Once | INTRAVENOUS | Status: DC
Start: 2021-11-13 — End: 2021-11-13

## 2021-11-13 NOTE — Op Note (Signed)
[name] Procedure Date: 11/13/2021 3:29 PM MRN: [id number] Endoscopist: Mallie Mussel L. Loletha Carrow , MD Age: 71 Referring MD:  Date of Birth: [birth year] Gender: Male Account #: [id number] Procedure:                Colonoscopy Indications:              Screening in patient at increased risk: Colorectal                            cancer in sister 67 or older                           no polyps last colonoscopy March 2018                           hyperplastic polyps 2008 Medicines:                Monitored Anesthesia Care Procedure:                Pre-Anesthesia Assessment:                           - Prior to the procedure, a History and Physical                            was performed, and patient medications and                            allergies were reviewed. The patient's tolerance of                            previous anesthesia was also reviewed. The risks                            and benefits of the procedure and the sedation                            options and risks were discussed with the patient.                            All questions were answered, and informed consent                            was obtained. Prior Anticoagulants: The patient has                            taken no previous anticoagulant or antiplatelet                            agents. ASA Grade Assessment: II - A patient with                            mild systemic disease. After reviewing the risks  and benefits, the patient was deemed in                            satisfactory condition to undergo the procedure.                           After obtaining informed consent, the colonoscope                            was passed under direct vision. Throughout the                            procedure, the patient's blood pressure, pulse, and                            oxygen saturations were monitored continuously. The                             CF [id number] #[id number] was introduced through the anus                            and advanced to the the cecum, identified by                            appendiceal orifice and ileocecal valve. The                            colonoscopy was performed with difficulty due to a                            redundant colon and significant looping. Successful                            completion of the procedure was aided by using                            manual pressure and straightening and shortening                            the scope to obtain bowel loop reduction. The                            patient tolerated the procedure well. The quality                            of the bowel preparation was good. The ileocecal                            valve, appendiceal orifice, and rectum were                            photographed. Scope In: 3:43:25 PM Scope Out: 4:00:43 PM Scope Withdrawal Time: 0 hours 9 minutes 34 seconds  Total Procedure Duration: 0  hours 17 minutes 18 seconds  Findings:                 The perianal and digital rectal examinations were                            normal.                           Many diverticula were found in the left colon.                           The colon (entire examined portion) was                            significantly redundant.                           There is no endoscopic evidence of polyps in the                            entire colon.                           The exam was otherwise without abnormality on                            direct and retroflexion views. Complications:            No immediate complications. Estimated Blood Loss:     Estimated blood loss: none. Impression:               - Diverticulosis in the left colon.                           - Redundant colon.                           - The examination was otherwise normal on direct                            and retroflexion views.                           -  No specimens collected. Recommendation:           - Patient has a contact number available for                            emergencies. The signs and symptoms of potential                            delayed complications were discussed with the                            patient. Return to normal activities tomorrow.                            Written discharge  instructions were provided to the                            patient.                           - Resume previous diet.                           - Continue present medications.                           - Repeat colonoscopy in 5 years for screening                            purposes. Jocelyne Reinertsen L. Loletha Carrow, MD 11/13/2021 4:08:22 PM This report has been signed electronically.

## 2021-11-13 NOTE — Progress Notes (Signed)
Report given to PACU, vss 

## 2021-11-13 NOTE — Progress Notes (Unsigned)
Pt's states no medical or surgical changes since previsit or office visit. 

## 2021-11-13 NOTE — Patient Instructions (Signed)
YOU HAD AN ENDOSCOPIC PROCEDURE TODAY AT [facility]:   Refer to the procedure report that was given to you for any specific questions about what was found during the examination.  If the procedure report does not answer your questions, please call your gastroenterologist to clarify.  If you requested that your care partner not be given the details of your procedure findings, then the procedure report has been included in a sealed envelope for you to review at your convenience later.  YOU SHOULD EXPECT: Some feelings of bloating in the abdomen. Passage of more gas than usual.  Walking can help get rid of the air that was put into your GI tract during the procedure and reduce the bloating. If you had a lower endoscopy (such as a colonoscopy or flexible sigmoidoscopy) you may notice spotting of blood in your stool or on the toilet paper. If you underwent a bowel prep for your procedure, you may not have a normal bowel movement for a few days.  Please Note:  You might notice some irritation and congestion in your nose or some drainage.  This is from the oxygen used during your procedure.  There is no need for concern and it should clear up in a day or so.  SYMPTOMS TO REPORT IMMEDIATELY:  Following lower endoscopy (colonoscopy or flexible sigmoidoscopy):  Excessive amounts of blood in the stool  Significant tenderness or worsening of abdominal pains  Swelling of the abdomen that is new, acute  Fever of 100F or higher  Following upper endoscopy (EGD)  Vomiting of blood or coffee ground material  New chest pain or pain under the shoulder blades  Painful or persistently difficult swallowing  New shortness of breath  Fever of 100F or higher  Black, tarry-looking stools  For urgent or emergent issues, a gastroenterologist can be reached at any hour by calling [phone]. Do not use MyChart messaging for urgent concerns.    DIET:  We do recommend a small meal at first, but  then you may proceed to your regular diet.  Drink plenty of fluids but you should avoid alcoholic beverages for 24 hours.  ACTIVITY:  You should plan to take it easy for the rest of today and you should NOT DRIVE or use heavy machinery until tomorrow (because of the sedation medicines used during the test).    FOLLOW UP: Our staff will call the number listed on your records 24-72 hours following your procedure to check on you and address any questions or concerns that you may have regarding the information given to you following your procedure. If we do not reach you, we will leave a message.  We will attempt to reach you two times.  During this call, we will ask if you have developed any symptoms of COVID 19. If you develop any symptoms (ie: fever, flu-like symptoms, shortness of breath, cough etc.) before then, please call [phone].  If you test positive for Covid 19 in the 2 weeks post procedure, please call and report this information to [place].    If any biopsies were taken you will be contacted by phone or by letter within the next 1-3 weeks.  Please call us at [phone] if you have not heard about the biopsies in 3 weeks.    SIGNATURES/CONFIDENTIALITY: You and/or your care partner have signed paperwork which will be entered into your electronic medical record.  These signatures attest to the fact that that the information above on your After  Visit Summary has been reviewed and is understood.  Full responsibility of the confidentiality of this discharge information lies with you and/or your care-partner.

## 2021-11-13 NOTE — Progress Notes (Signed)
History and Physical:  This patient presents for endoscopic testing for: Encounter Diagnosis  Name Primary?   Family history of malignant neoplasm of gastrointestinal tract Yes    No polyps on last colonoscopy March 2018.  Patient's sister had colorectal cancer over the age of 77. Hyperplastic polyps without adenomatous or SSP in 2008 Patient denies chronic abdominal pain, rectal bleeding, constipation or diarrhea.   Patient is otherwise without complaints or active issues today.   Past Medical History: Past Medical History:  Diagnosis Date   Allergy    Anxiety    Arthritis    Chronic kidney disease    hx kidney stones   Depression    GERD (gastroesophageal reflux disease)    Hyperlipemia    Hypertension    Sleep apnea 2005   moderate-has cpap-not used in 3 yr     Past Surgical History: Past Surgical History:  Procedure Laterality Date   COLONOSCOPY     INSERTION OF MESH  05/05/2012   Procedure: INSERTION OF MESH;  Surgeon: Rolm Bookbinder, MD;  Location: [place];  Service: General;  Laterality: N/A;   LITHOTRIPSY     x2   SHOULDER ARTHROSCOPY  07/2017   trigger finger surgery  [address]   [place]  05/05/2012   Procedure: HERNIA REPAIR UMBILICAL ADULT;  Surgeon: Rolm Bookbinder, MD;  Location: [place];  Service: General;  Laterality: N/A;    Allergies: No Known Allergies  Outpatient Meds: Current Outpatient Medications  Medication Sig Dispense Refill   ALPRAZolam (XANAX) 0.5 MG tablet Take 1 tablet (0.5 mg total) by mouth 2 (two) times daily as needed for anxiety. 30 tablet 0   aspirin 81 MG tablet Take 81 mg by mouth daily.     citalopram (CELEXA) 40 MG tablet TAKE ONE TABLET BY MOUTH EVERY MORNING 30 tablet 1   esomeprazole (NEXIUM) 40 MG capsule TAKE ONE CAPSULE BY MOUTH EVERY MORNING 90 capsule 1   ondansetron (ZOFRAN) 4 MG tablet TAKE 1 TABLET BY MOUTH EVERY 8 HOURS AS NEEDED 20 tablet 0   simvastatin  (ZOCOR) 10 MG tablet TAKE ONE TABLET BY MOUTH EVERYDAY AT BEDTIME 90 tablet 1   telmisartan (MICARDIS) 80 MG tablet TAKE ONE TABLET BY MOUTH EVERY MORNING 90 tablet 1   terbinafine (LAMISIL) 250 MG tablet Take 250 mg by mouth daily.     traZODone (DESYREL) 50 MG tablet TAKE 1/2 TO 1 TABLET BY MOUTH everday AT bedtime 90 tablet 1   diclofenac (VOLTAREN) 75 MG EC tablet TAKE ONE TABLET BY MOUTH twice daily between meals AS NEEDED 60 tablet 1   fluticasone-salmeterol (ADVAIR) 250-50 MCG/ACT AEPB INHALE 1 PUFF BY MOUTH INTO LUNGS twice daily 60 each 1   HYDROcodone-acetaminophen (NORCO/VICODIN) 5-325 MG tablet Take 1 tablet by mouth every 6 (six) hours as needed for moderate pain. 30 tablet 0   meclizine (ANTIVERT) 25 MG tablet TAKE 1 TABLET BY MOUTH THREE TIMES DAILY AS NEEDED FOR DIZZINESS 45 tablet 0   Current Facility-Administered Medications  Medication Dose Route Frequency Provider Last Rate Last Admin   0.9 %  sodium chloride infusion  500 mL Intravenous Once Doran Stabler, MD          ___________________________________________________________________ Objective   Exam:  BP (!) 124/53   Pulse 62   Temp (!) 97.3 F (36.3 C)   Ht '5\' 7"'$  (1.702 m)   Wt 250 lb (113.4 kg)   SpO2 96%   BMI 39.16 kg/m  CV: RRR without murmur, S1/S2 Resp: clear to auscultation bilaterally, normal RR and effort noted GI: soft, no tenderness, with active bowel sounds.   Assessment: Encounter Diagnosis  Name Primary?   Family history of malignant neoplasm of gastrointestinal tract Yes     Plan: Colonoscopy  The benefits and risks of the planned procedure were described in detail with the patient or (when appropriate) their health care proxy.  Risks were outlined as including, but not limited to, bleeding, infection, perforation, adverse medication reaction leading to cardiac or pulmonary decompensation, pancreatitis (if ERCP).  The limitation of incomplete mucosal visualization was also  discussed.  No guarantees or warranties were given.    The patient is appropriate for an endoscopic procedure in the ambulatory setting.   - Wilfrid Lund, MD

## 2021-11-14 ENCOUNTER — Telehealth: Payer: Self-pay | Admitting: *Deleted

## 2021-11-14 NOTE — Telephone Encounter (Signed)
  Follow up Call-     11/13/2021    3:17 PM  Call back number  Post procedure Call Back phone  # [phone]  Permission to leave phone message Yes     Patient questions:  Do you have a fever, pain , or abdominal swelling? No. Pain Score  0 *  Have you tolerated food without any problems? Yes.    Have you been able to return to your normal activities? Yes.    Do you have any questions about your discharge instructions: Diet   No. Medications  No. Follow up visit  No.  Do you have questions or concerns about your Care? No.  Actions: * If pain score is 4 or above: No action needed, pain <4.

## 2021-11-18 ENCOUNTER — Other Ambulatory Visit: Payer: Self-pay | Admitting: Family Medicine

## 2021-11-18 DIAGNOSIS — N200 Calculus of kidney: Secondary | ICD-10-CM

## 2021-11-18 MED ORDER — HYDROCODONE-ACETAMINOPHEN 5-325 MG PO TABS: 1.0000 | ORAL_TABLET | Freq: Four times a day (QID) | ORAL | 0 refills | Status: DC | PRN

## 2021-11-21 ENCOUNTER — Telehealth: Payer: Self-pay | Admitting: Pharmacist

## 2021-11-21 NOTE — Progress Notes (Signed)
Chronic Care Management Pharmacy Assistant   Name: Taylor Harvey.  MRN: [id number] DOB: 02-20-1951  Reason for Encounter: Medication Coordination Call      Medications: Outpatient Encounter Medications as of 11/21/2021  Medication Sig   ALPRAZolam (XANAX) 0.5 MG tablet Take 1 tablet (0.5 mg total) by mouth 2 (two) times daily as needed for anxiety.   aspirin 81 MG tablet Take 81 mg by mouth daily.   citalopram (CELEXA) 40 MG tablet TAKE ONE TABLET BY MOUTH EVERY MORNING   diclofenac (VOLTAREN) 75 MG EC tablet TAKE ONE TABLET BY MOUTH twice daily between meals AS NEEDED   esomeprazole (NEXIUM) 40 MG capsule TAKE ONE CAPSULE BY MOUTH EVERY MORNING   fluticasone-salmeterol (ADVAIR) 250-50 MCG/ACT AEPB INHALE 1 PUFF BY MOUTH INTO LUNGS twice daily   HYDROcodone-acetaminophen (NORCO/VICODIN) 5-325 MG tablet Take 1 tablet by mouth every 6 (six) hours as needed for moderate pain.   meclizine (ANTIVERT) 25 MG tablet TAKE 1 TABLET BY MOUTH THREE TIMES DAILY AS NEEDED FOR DIZZINESS   ondansetron (ZOFRAN) 4 MG tablet TAKE 1 TABLET BY MOUTH EVERY 8 HOURS AS NEEDED   simvastatin (ZOCOR) 10 MG tablet TAKE ONE TABLET BY MOUTH EVERYDAY AT BEDTIME   telmisartan (MICARDIS) 80 MG tablet TAKE ONE TABLET BY MOUTH EVERY MORNING   terbinafine (LAMISIL) 250 MG tablet Take 250 mg by mouth daily.   traZODone (DESYREL) 50 MG tablet TAKE 1/2 TO 1 TABLET BY MOUTH everday AT bedtime   No facility-administered encounter medications on file as of 11/21/2021.    Reviewed chart for medication changes ahead of medication coordination call.  No OVs, Consults, or hospital visits since last care coordination call/Pharmacist visit. (If appropriate, list visit date, provider name)  No medication changes indicated OR if recent visit, treatment plan here.  BP Readings from Last 3 Encounters:  11/13/21 (!) 178/88  09/12/21 124/70  03/07/21 130/68    Lab Results  Component Value Date   HGBA1C 6.3 04/14/2019      Patient obtains medications through Adherence Packaging  30 Days   Last adherence delivery included: (medication name and frequency)  Citalopram 40 mg; one every morning Esomeprazole 40 mg; one every morning Simvastatin 10 mg; one at bedtime Telmisartan 80 mg; one every morning Diclofenac Sodium '75mg'$  1 tablet twice daily as needed Advair 250/50 1 puff twice daily.   Patient declined Trazodone '50mg'$  1 tablet at bedtime (has plenty on hand)   Patient is due for next adherence delivery on: 12/05/21. Called patient and reviewed medications and coordinated delivery.  This delivery to include:  Citalopram 40 mg; one every morning Esomeprazole 40 mg; one every morning Simvastatin 10 mg; one at bedtime Telmisartan 80 mg; one every morning Advair 250/50 1 puff twice daily.   Patient declined Diclofenac Sodium '75mg'$  1 tablet twice daily as needed   Patient needs refills from PCP for 30 day supply Esomeprazole 40 mg; one every morning Simvastatin 10 mg; one at bedtime Telmisartan 80 mg; one every morning.  Confirmed delivery date of 12/05/21, advised patient that pharmacy will contact them the morning of delivery.   Care Gaps   AWV 05/21/21 Colonoscopy: done 08/13/16 (due 08/13/21) DM Eye Exam: N/A DM Foot Exam: N/A Microalbumin: N/A HbgAIC: done 04/14/19 (6.3) DEXA: never Mammogram: N/A     Star Rating Drugs: Telmisartan (MICARDIS) 80 MG tablet - last filled 10/31/21  30 days  Simvastatin (ZOCOR) 10 MG tablet - last filled 10/31/21 30 days   Future Appointments  Date Time Provider [place]  03/14/2022  8:20 AM Midge Minium, MD [facility] [place] [place] Pharmacist Assistant  [phone]

## 2021-11-28 ENCOUNTER — Other Ambulatory Visit: Payer: Self-pay

## 2021-11-28 ENCOUNTER — Other Ambulatory Visit: Payer: Self-pay | Admitting: Family Medicine

## 2021-11-28 DIAGNOSIS — E785 Hyperlipidemia, unspecified: Secondary | ICD-10-CM

## 2021-11-28 DIAGNOSIS — K219 Gastro-esophageal reflux disease without esophagitis: Secondary | ICD-10-CM

## 2021-11-28 MED ORDER — SIMVASTATIN 10 MG PO TABS: 10.0000 mg | ORAL_TABLET | Freq: Every day | ORAL | 1 refills | Status: DC

## 2021-11-28 MED ORDER — TELMISARTAN 80 MG PO TABS: 80.0000 mg | ORAL_TABLET | Freq: Every morning | ORAL | 1 refills | Status: DC

## 2021-11-28 MED ORDER — ESOMEPRAZOLE MAGNESIUM 40 MG PO CPDR: 40.0000 mg | DELAYED_RELEASE_CAPSULE | Freq: Every morning | ORAL | 1 refills | Status: DC

## 2021-12-16 ENCOUNTER — Other Ambulatory Visit: Payer: Self-pay | Admitting: Family Medicine

## 2021-12-24 ENCOUNTER — Telehealth: Payer: Self-pay | Admitting: Pharmacist

## 2021-12-24 NOTE — Progress Notes (Unsigned)
Chronic Care Management Pharmacy Assistant   Name: Taylor Harvey.  MRN: [id number] DOB: Jun 25, 1950   Reason for Encounter: Monthly Medication Coordination Call     Medications: Outpatient Encounter Medications as of 12/24/2021  Medication Sig   ALPRAZolam (XANAX) 0.5 MG tablet Take 1 tablet (0.5 mg total) by mouth 2 (two) times daily as needed for anxiety.   aspirin 81 MG tablet Take 81 mg by mouth daily.   citalopram (CELEXA) 40 MG tablet TAKE ONE TABLET BY MOUTH EVERY MORNING   diclofenac (VOLTAREN) 75 MG EC tablet TAKE ONE TABLET BY MOUTH twice daily between meals AS NEEDED   esomeprazole (NEXIUM) 40 MG capsule Take 1 capsule (40 mg total) by mouth every morning.   fluticasone-salmeterol (ADVAIR) 250-50 MCG/ACT AEPB INHALE 1 PUFF BY MOUTH INTO LUNGS twice daily   HYDROcodone-acetaminophen (NORCO/VICODIN) 5-325 MG tablet Take 1 tablet by mouth every 6 (six) hours as needed for moderate pain.   meclizine (ANTIVERT) 25 MG tablet TAKE 1 TABLET BY MOUTH THREE TIMES DAILY AS NEEDED FOR DIZZINESS   ondansetron (ZOFRAN) 4 MG tablet TAKE 1 TABLET BY MOUTH EVERY 8 HOURS AS NEEDED   simvastatin (ZOCOR) 10 MG tablet Take 1 tablet (10 mg total) by mouth daily.   telmisartan (MICARDIS) 80 MG tablet Take 1 tablet (80 mg total) by mouth every morning.   terbinafine (LAMISIL) 250 MG tablet Take 250 mg by mouth daily.   traZODone (DESYREL) 50 MG tablet TAKE 1/2 TO 1 TABLET BY MOUTH everday AT bedtime   No facility-administered encounter medications on file as of 12/24/2021.    Reviewed chart for medication changes ahead of medication coordination call.  No OVs, Consults, or hospital visits since last care coordination call/Pharmacist visit. (If appropriate, list visit date, provider name)  No medication changes indicated OR if recent visit, treatment plan here.   BP Readings from Last 3 Encounters:  11/13/21 (!) 178/88  09/12/21 124/70  03/07/21 130/68    Lab Results  Component Value  Date   HGBA1C 6.3 04/14/2019     Patient obtains medications through Adherence Packaging  30 Days   Last adherence delivery included: (medication name and frequency)  Citalopram 40 mg; one every morning Esomeprazole 40 mg; one every morning Simvastatin 10 mg; one at bedtime Telmisartan 80 mg; one every morning Advair 250/50 1 puff twice daily   Patient declined (meds) last month due to PRN use/additional supply on hand. Diclofenac Sodium '75mg'$  1 tablet twice daily as needed   Patient is due for next adherence delivery on: 01/03/22. Called patient and reviewed medications and coordinated delivery.  This delivery to include:  Citalopram 40 mg; one every morning Esomeprazole 40 mg; one every morning Simvastatin 10 mg; one at bedtime Telmisartan 80 mg; one every morning Advair 250/50 1 puff twice daily.   Patient declined the following medications (meds) due to (reason)   Patient needs refills from PCP for 30 day supply:  Citalopram 40 mg; one every morning and Advair 250/50 1 puff twice daily.  Confirmed delivery date of 01/03/22, advised patient that pharmacy will contact them the morning of delivery.   Care Gaps  AWV 05/21/21 Colonoscopy: done 08/13/16 (due 08/13/21) DM Eye Exam: N/A DM Foot Exam: N/A Microalbumin: N/A HbgAIC: done 04/14/19 (6.3) DEXA: never Mammogram: N/A     Star Rating Drugs: Telmisartan (MICARDIS) 80 MG tablet - last filled 11/29/21  30 days  Simvastatin (ZOCOR) 10 MG tablet - last filled 11/29/21 30 days  Future Appointments  Date Time Provider [place]  01/02/2022 10:15 AM [facility]  03/14/2022  8:20 AM Midge Minium, MD [facility] [place], [facility] Clinical Pharmacist Assistant  [phone]

## 2021-12-26 DIAGNOSIS — B351 Tinea unguium: Secondary | ICD-10-CM | POA: Diagnosis not present

## 2021-12-26 DIAGNOSIS — L821 Other seborrheic keratosis: Secondary | ICD-10-CM | POA: Diagnosis not present

## 2021-12-30 ENCOUNTER — Other Ambulatory Visit: Payer: Self-pay | Admitting: Family Medicine

## 2022-01-02 ENCOUNTER — Ambulatory Visit (INDEPENDENT_AMBULATORY_CARE_PROVIDER_SITE_OTHER): Payer: PPO

## 2022-01-02 DIAGNOSIS — Z0000 Encounter for general adult medical examination without abnormal findings: Secondary | ICD-10-CM | POA: Diagnosis not present

## 2022-01-02 NOTE — Patient Instructions (Signed)
Taylor Harvey , Thank you for taking time to come for your Medicare Wellness Visit. I appreciate your ongoing commitment to your health goals. Please review the following plan we discussed and let me know if I can assist you in the future.   Screening recommendations/referrals: Colonoscopy: 11/13/2021 Recommended yearly ophthalmology/optometry visit for glaucoma screening and checkup Recommended yearly dental visit for hygiene and checkup  Vaccinations: Influenza vaccine: completed Pneumococcal vaccine: completed Tdap vaccine: 09/22/2018 Shingles vaccine: will consider     Advanced directives: yes   Conditions/risks identified: none  Next appointment: none  Preventive Care 26 Years and Older, Male Preventive care refers to lifestyle choices and visits with your health care provider that can promote health and wellness. What does preventive care include? A yearly physical exam. This is also called an annual well check. Dental exams once or twice a year. Routine eye exams. Ask your health care provider how often you should have your eyes checked. Personal lifestyle choices, including: Daily care of your teeth and gums. Regular physical activity. Eating a healthy diet. Avoiding tobacco and drug use. Limiting alcohol use. Practicing safe sex. Taking low doses of aspirin every day. Taking vitamin and mineral supplements as recommended by your health care provider. What happens during an annual well check? The services and screenings done by your health care provider during your annual well check will depend on your age, overall health, lifestyle risk factors, and family history of disease. Counseling  Your health care provider may ask you questions about your: Alcohol use. Tobacco use. Drug use. Emotional well-being. Home and relationship well-being. Sexual activity. Eating habits. History of falls. Memory and ability to understand (cognition). Work and work  Statistician. Screening  You may have the following tests or measurements: Height, weight, and BMI. Blood pressure. Lipid and cholesterol levels. These may be checked every 5 years, or more frequently if you are over 40 years old. Skin check. Lung cancer screening. You may have this screening every year starting at age 70 if you have a 30-pack-year history of smoking and currently smoke or have quit within the past 15 years. Fecal occult blood test (FOBT) of the stool. You may have this test every year starting at age 52. Flexible sigmoidoscopy or colonoscopy. You may have a sigmoidoscopy every 5 years or a colonoscopy every 10 years starting at age 96. Prostate cancer screening. Recommendations will vary depending on your family history and other risks. Hepatitis C blood test. Hepatitis B blood test. Sexually transmitted disease (STD) testing. Diabetes screening. This is done by checking your blood sugar (glucose) after you have not eaten for a while (fasting). You may have this done every 1-3 years. Abdominal aortic aneurysm (AAA) screening. You may need this if you are a current or former smoker. Osteoporosis. You may be screened starting at age 78 if you are at high risk. Talk with your health care provider about your test results, treatment options, and if necessary, the need for more tests. Vaccines  Your health care provider may recommend certain vaccines, such as: Influenza vaccine. This is recommended every year. Tetanus, diphtheria, and acellular pertussis (Tdap, Td) vaccine. You may need a Td booster every 10 years. Zoster vaccine. You may need this after age 34. Pneumococcal 13-valent conjugate (PCV13) vaccine. One dose is recommended after age 46. Pneumococcal polysaccharide (PPSV23) vaccine. One dose is recommended after age 24. Talk to your health care provider about which screenings and vaccines you need and how often you need them. This information  is not intended to replace  advice given to you by your health care provider. Make sure you discuss any questions you have with your health care provider. Document Released: 06/08/2015 Document Revised: 01/30/2016 Document Reviewed: 03/13/2015 Elsevier Interactive Patient Education  2017 [place] Prevention in the Home Falls can cause injuries. They can happen to people of all ages. There are many things you can do to make your home safe and to help prevent falls. What can I do on the outside of my home? Regularly fix the edges of walkways and driveways and fix any cracks. Remove anything that might make you trip as you walk through a door, such as a raised step or threshold. Trim any bushes or trees on the path to your home. Use bright outdoor lighting. Clear any walking paths of anything that might make someone trip, such as rocks or tools. Regularly check to see if handrails are loose or broken. Make sure that both sides of any steps have handrails. Any raised decks and porches should have guardrails on the edges. Have any leaves, snow, or ice cleared regularly. Use sand or salt on walking paths during winter. Clean up any spills in your garage right away. This includes oil or grease spills. What can I do in the bathroom? Use night lights. Install grab bars by the toilet and in the tub and shower. Do not use towel bars as grab bars. Use non-skid mats or decals in the tub or shower. If you need to sit down in the shower, use a plastic, non-slip stool. Keep the floor dry. Clean up any water that spills on the floor as soon as it happens. Remove soap buildup in the tub or shower regularly. Attach bath mats securely with double-sided non-slip rug tape. Do not have throw rugs and other things on the floor that can make you trip. What can I do in the bedroom? Use night lights. Make sure that you have a light by your bed that is easy to reach. Do not use any sheets or blankets that are too big for your bed.  They should not hang down onto the floor. Have a firm chair that has side arms. You can use this for support while you get dressed. Do not have throw rugs and other things on the floor that can make you trip. What can I do in the kitchen? Clean up any spills right away. Avoid walking on wet floors. Keep items that you use a lot in easy-to-reach places. If you need to reach something above you, use a strong step stool that has a grab bar. Keep electrical cords out of the way. Do not use floor polish or wax that makes floors slippery. If you must use wax, use non-skid floor wax. Do not have throw rugs and other things on the floor that can make you trip. What can I do with my stairs? Do not leave any items on the stairs. Make sure that there are handrails on both sides of the stairs and use them. Fix handrails that are broken or loose. Make sure that handrails are as long as the stairways. Check any carpeting to make sure that it is firmly attached to the stairs. Fix any carpet that is loose or worn. Avoid having throw rugs at the top or bottom of the stairs. If you do have throw rugs, attach them to the floor with carpet tape. Make sure that you have a light switch at the top of  the stairs and the bottom of the stairs. If you do not have them, ask someone to add them for you. What else can I do to help prevent falls? Wear shoes that: Do not have high heels. Have rubber bottoms. Are comfortable and fit you well. Are closed at the toe. Do not wear sandals. If you use a stepladder: Make sure that it is fully opened. Do not climb a closed stepladder. Make sure that both sides of the stepladder are locked into place. Ask someone to hold it for you, if possible. Clearly mark and make sure that you can see: Any grab bars or handrails. First and last steps. Where the edge of each step is. Use tools that help you move around (mobility aids) if they are needed. These  include: Canes. Walkers. Scooters. Crutches. Turn on the lights when you go into a dark area. Replace any light bulbs as soon as they burn out. Set up your furniture so you have a clear path. Avoid moving your furniture around. If any of your floors are uneven, fix them. If there are any pets around you, be aware of where they are. Review your medicines with your doctor. Some medicines can make you feel dizzy. This can increase your chance of falling. Ask your doctor what other things that you can do to help prevent falls. This information is not intended to replace advice given to you by your health care provider. Make sure you discuss any questions you have with your health care provider. Document Released: 03/08/2009 Document Revised: 10/18/2015 Document Reviewed: 06/16/2014 Elsevier Interactive Patient Education  2017 [employer].

## 2022-01-02 NOTE — Progress Notes (Signed)
Subjective:   Pepper Wyndham. is a 71 y.o. male who presents for an Subsequent Medicare Annual Wellness Visit.   I connected with Lajean Saver  today by telephone and verified that I am speaking with the correct person using two identifiers. Location patient: home Location provider: work Persons participating in the virtual visit: patient, provider.   I discussed the limitations, risks, security and privacy concerns of performing an evaluation and management service by telephone and the availability of in person appointments. I also discussed with the patient that there may be a patient responsible charge related to this service. The patient expressed understanding and verbally consented to this telephonic visit.    Interactive audio and video telecommunications were attempted between this provider and patient, however failed, due to patient having technical difficulties OR patient did not have access to video capability.  We continued and completed visit with audio only.    Review of Systems     Cardiac Risk Factors include: advanced age (>38mn, >>34women);male gender     Objective:    Today's Vitals   There is no height or weight on file to calculate BMI.     01/02/2022   10:21 AM 11/19/2020    8:05 AM 04/30/2020    9:07 AM 07/23/2017    8:24 AM 08/13/2016   10:19 AM 07/30/2016    8:32 AM 05/04/2012   10:32 AM  Advanced Directives  Does Patient Have a Medical Advance Directive? Yes No No No Yes No Patient does not have advance directive  Type of AScientist, forensicPower of AHarrisonLiving will        Does patient want to make changes to medical advance directive?   No - Patient declined      Copy of HHuguleyin Chart? No - copy requested        Would patient like information on creating a medical advance directive?  No - Patient declined  No - Patient declined       Current Medications (verified) Outpatient Encounter Medications as of  01/02/2022  Medication Sig   ALPRAZolam (XANAX) 0.5 MG tablet Take 1 tablet (0.5 mg total) by mouth 2 (two) times daily as needed for anxiety.   aspirin 81 MG tablet Take 81 mg by mouth daily.   citalopram (CELEXA) 40 MG tablet TAKE ONE TABLET BY MOUTH EVERY MORNING   diclofenac (VOLTAREN) 75 MG EC tablet TAKE ONE TABLET BY MOUTH twice daily between meals AS NEEDED   esomeprazole (NEXIUM) 40 MG capsule Take 1 capsule (40 mg total) by mouth every morning.   fluticasone-salmeterol (ADVAIR) 250-50 MCG/ACT AEPB INHALE 1 PUFF BY MOUTH INTO LUNGS twice daily   HYDROcodone-acetaminophen (NORCO/VICODIN) 5-325 MG tablet Take 1 tablet by mouth every 6 (six) hours as needed for moderate pain.   meclizine (ANTIVERT) 25 MG tablet TAKE 1 TABLET BY MOUTH THREE TIMES DAILY AS NEEDED FOR DIZZINESS   ondansetron (ZOFRAN) 4 MG tablet TAKE 1 TABLET BY MOUTH EVERY 8 HOURS AS NEEDED   simvastatin (ZOCOR) 10 MG tablet Take 1 tablet (10 mg total) by mouth daily.   telmisartan (MICARDIS) 80 MG tablet Take 1 tablet (80 mg total) by mouth every morning.   terbinafine (LAMISIL) 250 MG tablet Take 250 mg by mouth daily.   traZODone (DESYREL) 50 MG tablet TAKE 1/2 TO 1 TABLET BY MOUTH everday AT bedtime   No facility-administered encounter medications on file as of 01/02/2022.    Allergies (verified) Patient  has no known allergies.   History: Past Medical History:  Diagnosis Date   Allergy    Anxiety    Arthritis    Chronic kidney disease    hx kidney stones   Depression    GERD (gastroesophageal reflux disease)    Hyperlipemia    Hypertension    Sleep apnea 2005   moderate-has cpap-not used in 3 yr   Past Surgical History:  Procedure Laterality Date   COLONOSCOPY     INSERTION OF MESH  05/05/2012   Procedure: INSERTION OF MESH;  Surgeon: Rolm Bookbinder, MD;  Location: [place];  Service: General;  Laterality: N/A;   LITHOTRIPSY     x2   SHOULDER ARTHROSCOPY  07/2017   trigger finger  surgery  10[address]   [place]  05/05/2012   Procedure: HERNIA REPAIR UMBILICAL ADULT;  Surgeon: Rolm Bookbinder, MD;  Location: [facility];  Service: General;  Laterality: N/A;   Family History  Problem Relation Age of Onset   Diabetes Mother    Pancreatitis Mother    Heart disease Mother 20       MI, died with MI   Stroke Father    Hypertension Father    Peripheral vascular disease Father        Carotid surgery   Heart disease Sister        Arrhythmia   Diabetes Sister 81       DM   Kidney cancer Sister    Diabetes Sister    Cancer Sister        colon   Heart disease Sister    Diabetes Sister    Heart disease Sister    Stroke Brother    Colon cancer Sister    Pancreatic cancer Other    Social History   Socioeconomic History   Marital status: Married    Spouse name: Not on file   Number of children: 6   Years of education: Not on file   Highest education level: Not on file  Occupational History   Occupation: retired  Tobacco Use   Smoking status: Never   Smokeless tobacco: Never  Vaping Use   Vaping Use: Never used  Substance and Sexual Activity   Alcohol use: No   Drug use: No   Sexual activity: Yes    Partners: Female  Other Topics Concern   Not on file  Social History Narrative   Exercise--walks a mile 5 days a week.  One deceased child age 34.  One stepchild, 5 adopted.    Social Determinants of Health   Financial Resource Strain: Low Risk  (01/02/2022)   Overall Financial Resource Strain (CARDIA)    Difficulty of Paying Living Expenses: Not hard at all  Food Insecurity: No Food Insecurity (01/02/2022)   Hunger Vital Sign    Worried About Running Out of Food in the Last Year: Never true    Ran Out of Food in the Last Year: Never true  Transportation Needs: No Transportation Needs (01/02/2022)   PRAPARE - Hydrologist (Medical): No    Lack of Transportation (Non-Medical): No  Physical Activity:  Inactive (01/02/2022)   Exercise Vital Sign    Days of Exercise per Week: 0 days    Minutes of Exercise per Session: 0 min  Stress: No Stress Concern Present (01/02/2022)   [place]    Feeling of Stress : Not at all  Social Connections: Moderately Integrated (01/02/2022)   Social Connection and Isolation Panel [NHANES]    Frequency of Communication with Friends and Family: Three times a week    Frequency of Social Gatherings with Friends and Family: Three times a week    Attends Religious Services: More than 4 times per year    Active Member of Clubs or Organizations: Yes    Attends Archivist Meetings: More than 4 times per year    Marital Status: Widowed    Tobacco Counseling Counseling given: Not Answered   Clinical Intake:  Pre-visit preparation completed: Yes  Pain : No/denies pain     Nutritional Risks: None Diabetes: No  How often do you need to have someone help you when you read instructions, pamphlets, or other written materials from your doctor or pharmacy?: 1 - Never What is the last grade level you completed in school?: BA  Diabetic?no   Interpreter Needed?: No  Information entered by :: L.Wilson,LPn   Activities of Daily Living    01/02/2022   10:21 AM 09/12/2021    8:17 AM  In your present state of health, do you have any difficulty performing the following activities:  Hearing? 0 0  Vision? 0 0  Difficulty concentrating or making decisions? 0 0  Walking or climbing stairs? 0 0  Dressing or bathing? 0 0  Doing errands, shopping? 0 0  Preparing Food and eating ? N   Using the Toilet? N   In the past six months, have you accidently leaked urine? N   Do you have problems with loss of bowel control? N   Managing your Medications? N   Managing your Finances? N   Housekeeping or managing your Housekeeping? N     Patient Care Team: Midge Minium, MD as PCP - General  (Family Medicine) Loletha Carrow Kirke Corin, MD as Consulting Physician (Gastroenterology) Mcarthur Rossetti, MD as Consulting Physician (Orthopedic Surgery) Madelin Rear, [facility] as Pharmacist (Pharmacist)  Indicate any recent Medical Services you may have received from other than [facility] providers in the past year (date may be approximate).     Assessment:   This is a routine wellness examination for [place].  Hearing/Vision screen Vision Screening - Comments:: Annual eye exams wear glasses   Dietary issues and exercise activities discussed: Current Exercise Habits: The patient does not participate in regular exercise at present   Goals Addressed   None    Depression Screen    01/02/2022   10:21 AM 01/02/2022   10:20 AM 09/12/2021    8:20 AM 03/07/2021   11:05 AM 02/05/2021   11:07 AM 05/30/2020    1:24 PM 05/22/2020    1:35 PM  PHQ 2/9 Scores  PHQ - 2 Score 0 0 2 0 1 0 0  PHQ- 9 Score   4 0 3 0 0    Fall Risk    01/02/2022   10:21 AM 09/12/2021    8:20 AM 03/07/2021   11:04 AM 02/05/2021   11:07 AM 05/30/2020    1:24 PM  Fall Risk   Falls in the past year? 0 0 0 0 0  Number falls in past yr: 0  0  0  Injury with Fall? 0  0  0  Risk for fall due to :  No Fall Risks No Fall Risks No Fall Risks No Fall Risks  Follow up Falls evaluation completed;Education provided Falls evaluation completed  Falls evaluation completed     FALL RISK  PREVENTION PERTAINING TO THE HOME:  Any stairs in or around the home? No  If so, are there any without handrails? No  Home free of loose throw rugs in walkways, pet beds, electrical cords, etc? Yes  Adequate lighting in your home to reduce risk of falls? Yes   ASSISTIVE DEVICES UTILIZED TO PREVENT FALLS:  Life alert? No  Use of a cane, walker or w/c? No  Grab bars in the bathroom? No  Shower chair or bench in shower? No  Elevated toilet seat or a handicapped toilet? No     Cognitive Function:  Normal cognitive status assessed by telephone  conversation by this Nurse Health Advisor. No abnormalities found.      07/23/2017    8:27 AM  MMSE - Mini Mental State Exam  Orientation to time 5  Orientation to Place 5  Registration 3  Attention/ Calculation 5  Recall 3  Language- name 2 objects 2  Language- repeat 1  Language- follow 3 step command 3  Language- read & follow direction 1  Write a sentence 1  Copy design 1  Total score 30        01/02/2022   10:23 AM  6CIT Screen  What Year? 0 points  What month? 0 points  What time? 0 points  Count back from 20 0 points  Months in reverse 0 points  Repeat phrase 0 points  Total Score 0 points    Immunizations Immunization History  Administered Date(s) Administered   Influenza Split 03/29/2012   Influenza Whole 03/30/2009, 02/24/2011   Influenza, High Dose Seasonal PF 02/19/2019   Influenza,inj,Quad PF,6+ Mos 03/21/2013, 03/17/2014, 03/20/2016, 02/16/2017, 01/14/2018   Pneumococcal Conjugate-13 07/19/2014   Pneumococcal Polysaccharide-23 01/07/2016   Td 03/04/2007, 09/22/2018   Zoster, Live 03/29/2012    TDAP status: Up to date  Flu Vaccine status: Up to date  Pneumococcal vaccine status: Up to date  Covid-19 vaccine status: Completed vaccines  Qualifies for Shingles Vaccine? Yes   Zostavax completed No   Shingrix Completed?: No.    Education has been provided regarding the importance of this vaccine. Patient has been advised to call insurance company to determine out of pocket expense if they have not yet received this vaccine. Advised may also receive vaccine at local pharmacy or Health Dept. Verbalized acceptance and understanding.  Screening Tests Health Maintenance  Topic Date Due   INFLUENZA VACCINE  12/24/2021   COLONOSCOPY (Pts 45-32yr Insurance coverage will need to be confirmed)  11/14/2026   TETANUS/TDAP  09/21/2028   Pneumonia Vaccine 72 Years old  Completed   Hepatitis C Screening  Completed   HPV VACCINES  Aged Out   COVID-19 Vaccine   Discontinued   Zoster Vaccines- Shingrix  Discontinued    Health Maintenance  Health Maintenance Due  Topic Date Due   INFLUENZA VACCINE  12/24/2021    Colorectal cancer screening: Type of screening: Colonoscopy. Completed 11/13/2021. Repeat every 5 years  Lung Cancer Screening: (Low Dose CT Chest recommended if Age 72-80years, 30 pack-year currently smoking OR have quit w/in 15years.) does not qualify.   Lung Cancer Screening Referral: n/a  Additional Screening:  Hepatitis C Screening: does not qualify;   Vision Screening: Recommended annual ophthalmology exams for early detection of glaucoma and other disorders of the eye. Is the patient up to date with their annual eye exam?  Yes  Who is the provider or what is the name of the office in which the patient attends annual eye exams?  [facility]  If pt is not established with a provider, would they like to be referred to a provider to establish care? No .   Dental Screening: Recommended annual dental exams for proper oral hygiene  Community Resource Referral / Chronic Care Management: CRR required this visit?  No   CCM required this visit?  No      Plan:     I have personally reviewed and noted the following in the patient's chart:   Medical and social history Use of alcohol, tobacco or illicit drugs  Current medications and supplements including opioid prescriptions. Patient is not currently taking opioid prescriptions. Functional ability and status Nutritional status Physical activity Advanced directives List of other physicians Hospitalizations, surgeries, and ER visits in previous 12 months Vitals Screenings to include cognitive, depression, and falls Referrals and appointments  In addition, I have reviewed and discussed with patient certain preventive protocols, quality metrics, and best practice recommendations. A written personalized care plan for preventive services as well as general preventive health  recommendations were provided to patient.     Daphane Shepherd, LPN   [id number]   Nurse Notes: none

## 2022-01-22 ENCOUNTER — Telehealth: Payer: Self-pay | Admitting: Pharmacist

## 2022-01-22 NOTE — Progress Notes (Signed)
    Chronic Care Management Pharmacy Assistant   Name: May Ozment.  MRN: [id number] DOB: 06/01/1950   Reason for Encounter: Monthly Medication Coordination Call      Medications: Outpatient Encounter Medications as of 01/22/2022  Medication Sig   ALPRAZolam (XANAX) 0.5 MG tablet Take 1 tablet (0.5 mg total) by mouth 2 (two) times daily as needed for anxiety.   aspirin 81 MG tablet Take 81 mg by mouth daily.   citalopram (CELEXA) 40 MG tablet TAKE ONE TABLET BY MOUTH EVERY MORNING   diclofenac (VOLTAREN) 75 MG EC tablet TAKE ONE TABLET BY MOUTH twice daily between meals AS NEEDED   esomeprazole (NEXIUM) 40 MG capsule Take 1 capsule (40 mg total) by mouth every morning.   fluticasone-salmeterol (ADVAIR) 250-50 MCG/ACT AEPB INHALE 1 PUFF BY MOUTH INTO LUNGS twice daily   HYDROcodone-acetaminophen (NORCO/VICODIN) 5-325 MG tablet Take 1 tablet by mouth every 6 (six) hours as needed for moderate pain.   meclizine (ANTIVERT) 25 MG tablet TAKE 1 TABLET BY MOUTH THREE TIMES DAILY AS NEEDED FOR DIZZINESS   ondansetron (ZOFRAN) 4 MG tablet TAKE 1 TABLET BY MOUTH EVERY 8 HOURS AS NEEDED   simvastatin (ZOCOR) 10 MG tablet Take 1 tablet (10 mg total) by mouth daily.   telmisartan (MICARDIS) 80 MG tablet Take 1 tablet (80 mg total) by mouth every morning.   terbinafine (LAMISIL) 250 MG tablet Take 250 mg by mouth daily.   traZODone (DESYREL) 50 MG tablet TAKE 1/2 TO 1 TABLET BY MOUTH everday AT bedtime   No facility-administered encounter medications on file as of 01/22/2022.    Reviewed chart for medication changes ahead of medication coordination call.  No OVs, Consults, or hospital visits since last care coordination call/Pharmacist visit. (If appropriate, list visit date, provider name)  No medication changes indicated OR if recent visit, treatment plan here.  BP Readings from Last 3 Encounters:  11/13/21 (!) 178/88  09/12/21 124/70  03/07/21 130/68    Lab Results  Component  Value Date   HGBA1C 6.3 04/14/2019     Patient obtains medications through Adherence Packaging  30 Days   Last adherence delivery included: (medication name and frequency) Citalopram 40 mg; one every morning Esomeprazole 40 mg; one every morning Simvastatin 10 mg; one at bedtime Telmisartan 80 mg; one every morning Advair 250/50 1 puff twice daily.   Patient is due for next adherence delivery on: 02/03/22. Called patient and reviewed medications and coordinated delivery.   This delivery to include: Citalopram 40 mg; one every morning Esomeprazole 40 mg; one every morning Simvastatin 10 mg; one at bedtime Telmisartan 80 mg; one every morning Advair 250/50 1 puff twice daily.    Patient needs refills for None.   Confirmed delivery date of 02/03/22, advised patient that pharmacy will contact them the morning of delivery.   Care Gaps   AWV 05/21/21 Colonoscopy: done 08/13/16 (due 08/13/21) DM Eye Exam: N/A DM Foot Exam: N/A Microalbumin: N/A HbgAIC: done 04/14/19 (6.3) DEXA: never Mammogram: N/A     Star Rating Drugs: Telmisartan (MICARDIS) 80 MG tablet - last filled 11/29/21  30 days  Simvastatin (ZOCOR) 10 MG tablet - last filled 11/29/21 30 days    Jobe Gibbon, [facility] Clinical Pharmacist Assistant  [phone]

## 2022-02-03 ENCOUNTER — Other Ambulatory Visit: Payer: Self-pay | Admitting: Family Medicine

## 2022-02-03 MED ORDER — MECLIZINE HCL 25 MG PO TABS: ORAL_TABLET | ORAL | 0 refills | Status: DC

## 2022-02-03 NOTE — Telephone Encounter (Signed)
Patient is requesting a refill of the following medications: Requested Prescriptions   Pending Prescriptions Disp Refills   meclizine (ANTIVERT) 25 MG tablet 45 tablet 0    Sig: TAKE 1 TABLET BY MOUTH THREE TIMES DAILY AS NEEDED FOR DIZZINESS    Date of patient request: 02/03/2022 Last office visit: 09/12/2021 Date of last refill: 05/21/2021 Last refill amount: 45 tablets  Follow up time period per chart:  03/14/2022

## 2022-02-21 ENCOUNTER — Telehealth: Payer: Self-pay | Admitting: Pharmacist

## 2022-02-21 NOTE — Progress Notes (Signed)
Chronic Care Management Pharmacy Assistant   Name: Taylor Harvey.  MRN: [id number] DOB: June 10, 1950   Reason for Encounter: Monthly Medication Coordination Call     Medications: Outpatient Encounter Medications as of 02/21/2022  Medication Sig   meclizine (ANTIVERT) 25 MG tablet TAKE 1 TABLET BY MOUTH THREE TIMES DAILY AS NEEDED FOR DIZZINESS   ALPRAZolam (XANAX) 0.5 MG tablet Take 1 tablet (0.5 mg total) by mouth 2 (two) times daily as needed for anxiety.   aspirin 81 MG tablet Take 81 mg by mouth daily.   citalopram (CELEXA) 40 MG tablet TAKE ONE TABLET BY MOUTH EVERY MORNING   diclofenac (VOLTAREN) 75 MG EC tablet TAKE ONE TABLET BY MOUTH twice daily between meals AS NEEDED   esomeprazole (NEXIUM) 40 MG capsule Take 1 capsule (40 mg total) by mouth every morning.   fluticasone-salmeterol (ADVAIR) 250-50 MCG/ACT AEPB INHALE 1 PUFF BY MOUTH INTO LUNGS twice daily   HYDROcodone-acetaminophen (NORCO/VICODIN) 5-325 MG tablet Take 1 tablet by mouth every 6 (six) hours as needed for moderate pain.   ondansetron (ZOFRAN) 4 MG tablet TAKE 1 TABLET BY MOUTH EVERY 8 HOURS AS NEEDED   simvastatin (ZOCOR) 10 MG tablet Take 1 tablet (10 mg total) by mouth daily.   telmisartan (MICARDIS) 80 MG tablet Take 1 tablet (80 mg total) by mouth every morning.   terbinafine (LAMISIL) 250 MG tablet Take 250 mg by mouth daily.   traZODone (DESYREL) 50 MG tablet TAKE 1/2 TO 1 TABLET BY MOUTH everday AT bedtime   No facility-administered encounter medications on file as of 02/21/2022.    Reviewed chart for medication changes ahead of medication coordination call.  No OVs, Consults, or hospital visits since last care coordination call/Pharmacist visit. (If appropriate, list visit date, provider name)  No medication changes indicated OR if recent visit, treatment plan here.  BP Readings from Last 3 Encounters:  11/13/21 (!) 178/88  09/12/21 124/70  03/07/21 130/68    Lab Results  Component Value  Date   HGBA1C 6.3 04/14/2019     Patient obtains medications through Adherence Packaging  30 Days   Last adherence delivery included: (medication name and frequency)  Citalopram 40 mg; one every morning Esomeprazole 40 mg; one every morning Simvastatin 10 mg; one at bedtime Telmisartan 80 mg; one every morning Advair 250/50 1 puff twice daily.   Patient is due for next adherence delivery on: 03/05/22. Called patient and reviewed medications and coordinated delivery.  This delivery to include:  Citalopram 40 mg; one every morning Esomeprazole 40 mg; one every morning Simvastatin 10 mg; one at bedtime Telmisartan 80 mg; one every morning Advair 250/50 1 puff twice daily   Patient needs refills from PCP for 30 day supply Citalopram 40 mg; one every morning Advair 250/50 1 puff twice daily  Confirmed delivery date of 03/05/22 advised patient that pharmacy will contact them the morning of delivery.    Care Gaps   AWV 05/21/21 Colonoscopy: done 08/13/16 (due 08/13/21) DM Eye Exam: N/A DM Foot Exam: N/A Microalbumin: N/A HbgAIC: done 04/14/19 (6.3) DEXA: never Mammogram: N/A     Star Rating Drugs: Telmisartan (MICARDIS) 80 MG tablet - last filled 01/28/22 30 days  Simvastatin (ZOCOR) 10 MG tablet - last filled 01/28/22 30 days   Future Appointments  Date Time Provider [place]  03/14/2022  8:20 AM Midge Minium, MD [facility]  01/08/2023 11:00 AM [facility] [place], [facility] Clinical  Pharmacist Assistant  [phone]

## 2022-02-28 ENCOUNTER — Other Ambulatory Visit: Payer: Self-pay | Admitting: Family Medicine

## 2022-03-14 ENCOUNTER — Encounter: Payer: Self-pay | Admitting: Family Medicine

## 2022-03-14 ENCOUNTER — Ambulatory Visit (INDEPENDENT_AMBULATORY_CARE_PROVIDER_SITE_OTHER): Payer: PPO | Admitting: Family Medicine

## 2022-03-14 VITALS — BP 138/60 | HR 68 | Temp 98.9°F | Resp 16 | Ht 67.0 in | Wt 245.1 lb

## 2022-03-14 DIAGNOSIS — E785 Hyperlipidemia, unspecified: Secondary | ICD-10-CM

## 2022-03-14 DIAGNOSIS — H66002 Acute suppurative otitis media without spontaneous rupture of ear drum, left ear: Secondary | ICD-10-CM | POA: Diagnosis not present

## 2022-03-14 DIAGNOSIS — I10 Essential (primary) hypertension: Secondary | ICD-10-CM

## 2022-03-14 LAB — HEPATIC FUNCTION PANEL
ALT: 15 U/L (ref 0–53)
AST: 14 U/L (ref 0–37)
Albumin: 4.1 g/dL (ref 3.5–5.2)
Alkaline Phosphatase: 80 U/L (ref 39–117)
Bilirubin, Direct: 0.2 mg/dL (ref 0.0–0.3)
Total Bilirubin: 0.7 mg/dL (ref 0.2–1.2)
Total Protein: 6.5 g/dL (ref 6.0–8.3)

## 2022-03-14 LAB — BASIC METABOLIC PANEL
BUN: 13 mg/dL (ref 6–23)
CO2: 25 mEq/L (ref 19–32)
Calcium: 9.2 mg/dL (ref 8.4–10.5)
Chloride: 107 mEq/L (ref 96–112)
Creatinine, Ser: 1.21 mg/dL (ref 0.40–1.50)
GFR: 59.71 mL/min — ABNORMAL LOW (ref >60.00)
Glucose, Bld: 120 mg/dL — ABNORMAL HIGH (ref 70–99)
Potassium: 4.5 mEq/L (ref 3.5–5.1)
Sodium: 141 mEq/L (ref 135–145)

## 2022-03-14 LAB — CBC WITH DIFFERENTIAL/PLATELET
Basophils Absolute: 0 10*3/uL (ref 0.0–0.1)
Basophils Relative: 0.5 % (ref 0.0–3.0)
Eosinophils Absolute: 0.1 10*3/uL (ref 0.0–0.7)
Eosinophils Relative: 2 % (ref 0.0–5.0)
HCT: 41 % (ref 39.0–52.0)
Hemoglobin: 13.2 g/dL (ref 13.0–17.0)
Lymphocytes Relative: 22.9 % (ref 12.0–46.0)
Lymphs Abs: 1.2 10*3/uL (ref 0.7–4.0)
MCHC: 32.3 g/dL (ref 30.0–36.0)
MCV: 89.7 fl (ref 78.0–100.0)
Monocytes Absolute: 0.5 10*3/uL (ref 0.1–1.0)
Monocytes Relative: 9.8 % (ref 3.0–12.0)
Neutro Abs: 3.3 10*3/uL (ref 1.4–7.7)
Neutrophils Relative %: 64.8 % (ref 43.0–77.0)
Platelets: 207 10*3/uL (ref 150.0–400.0)
RBC: 4.57 Mil/uL (ref 4.22–5.81)
RDW: 15.2 % (ref 11.5–15.5)
WBC: 5.1 10*3/uL (ref 4.0–10.5)

## 2022-03-14 LAB — LIPID PANEL
Cholesterol: 98 mg/dL (ref 0–200)
HDL: 32.5 mg/dL — ABNORMAL LOW (ref >39.00)
LDL Cholesterol: 48 mg/dL (ref 0–99)
NonHDL: 65.27
Total CHOL/HDL Ratio: 3
Triglycerides: 87 mg/dL (ref 0.0–149.0)
VLDL: 17.4 mg/dL (ref 0.0–40.0)

## 2022-03-14 LAB — HEMOGLOBIN A1C: Hgb A1c MFr Bld: 6.8 % — ABNORMAL HIGH (ref 4.6–6.5)

## 2022-03-14 LAB — TSH: TSH: 1.16 u[IU]/mL (ref 0.35–5.50)

## 2022-03-14 MED ORDER — AMOXICILLIN 875 MG PO TABS: 875.0000 mg | ORAL_TABLET | Freq: Two times a day (BID) | ORAL | 0 refills | Status: AC

## 2022-03-14 NOTE — Assessment & Plan Note (Signed)
Chronic problem.  Adequate control on Telmisartan '80mg'$  daily.  Currently asymptomatic.  Check labs due to ARB.  No anticipated med changes.

## 2022-03-14 NOTE — Progress Notes (Signed)
   Subjective:    Patient ID: Taylor Hua., male    DOB: 1950/08/19, 71 y.o.   MRN: [id number]  HPI HTN- chronic problem, on Telmisartan '80mg'$  daily w/ adequate control.  No CP, SOB, HAs, visual changes, edema  Hyperlipidemia- chronic problem, on Simvastatin '10mg'$ .  No abd pain, N/V  Obesity- Pt is down 5 lbs since last visit.  BMI now 38.39.  pt has decreased his intake.  No regular exercise.  L ear pain- pt reports off and on pain for ~2 weeks  Review of Systems For ROS see HPI     Objective:   Physical Exam Vitals reviewed.  Constitutional:      General: He is not in acute distress.    Appearance: Normal appearance. He is well-developed. He is obese. He is not ill-appearing.  HENT:     Head: Normocephalic and atraumatic.     Right Ear: Tympanic membrane and ear canal normal.     Left Ear: A middle ear effusion is present. Tympanic membrane is injected and bulging.  Eyes:     Extraocular Movements: Extraocular movements intact.     Conjunctiva/sclera: Conjunctivae normal.     Pupils: Pupils are equal, round, and reactive to light.  Neck:     Thyroid: No thyromegaly.  Cardiovascular:     Rate and Rhythm: Normal rate and regular rhythm.     Pulses: Normal pulses.     Heart sounds: Normal heart sounds. No murmur heard. Pulmonary:     Effort: Pulmonary effort is normal. No respiratory distress.     Breath sounds: Normal breath sounds.  Abdominal:     General: Bowel sounds are normal. There is no distension.     Palpations: Abdomen is soft.  Musculoskeletal:     Cervical back: Normal range of motion and neck supple.     Right lower leg: No edema.     Left lower leg: No edema.  Lymphadenopathy:     Cervical: No cervical adenopathy.  Skin:    General: Skin is warm and dry.  Neurological:     General: No focal deficit present.     Mental Status: He is alert and oriented to person, place, and time.     Cranial Nerves: No cranial nerve deficit.  Psychiatric:         Mood and Affect: Mood normal.        Behavior: Behavior normal.           Assessment & Plan:  L OM- new.  Pt's ear is red, bulging, opaque.  Start Amoxicillin twice daily.  Reviewed supportive care and red flags that should prompt return.

## 2022-03-14 NOTE — Assessment & Plan Note (Signed)
Ongoing issue.  Pt is down 5 lbs since last visit.  Applauded his efforts and encouraged him to continue.  Will continue to follow.

## 2022-03-14 NOTE — Assessment & Plan Note (Signed)
Chronic problem.  On Simvastatin w/o difficulty.  Check labs.  Adjust meds prn

## 2022-03-14 NOTE — Patient Instructions (Addendum)
Schedule your complete physical in 6 months We'll notify you of your lab results and make any changes if needed Continue to work on healthy diet and regular exercise- you can do it!!! START the Amoxicillin twice daily- take w/ food- for the ear infection Call with any questions or concerns Stay Safe!  Stay Healthy! Happy Fall!!!

## 2022-03-17 ENCOUNTER — Telehealth: Payer: Self-pay

## 2022-03-17 NOTE — Telephone Encounter (Signed)
-----   Message from Midge Minium, MD sent at 03/17/2022  7:44 AM EDT ----- Your A1C shows [place] that you are officially in the diabetes range.  Thankfully, we do not need to start medication w/ an A1C of 6.8%  We just need to work on a low carb/low sugar diet and regular physical activity.  Please make sure you are getting your eyes checked yearly to monitor for possible eye complications and follow up w/ me in 6 months as scheduled.  Remainder of labs look good!

## 2022-03-17 NOTE — Telephone Encounter (Signed)
Left pt a vm to call office in regards to lab results  

## 2022-03-19 NOTE — Telephone Encounter (Signed)
Spoke to the pt and advised of lab results  

## 2022-03-19 NOTE — Telephone Encounter (Signed)
Pt was returning a phone call from [place]. 

## 2022-03-25 ENCOUNTER — Telehealth: Payer: Self-pay | Admitting: Pharmacist

## 2022-03-25 NOTE — Progress Notes (Signed)
    Chronic Care Management Pharmacy Assistant   Name: Taylor Harvey.  MRN: [id number] DOB: 11/23/1950  Reason for Encounter: Monthly Medication Coordination Call     Medications: Outpatient Encounter Medications as of 03/25/2022  Medication Sig   ALPRAZolam (XANAX) 0.5 MG tablet Take 1 tablet (0.5 mg total) by mouth 2 (two) times daily as needed for anxiety.   aspirin 81 MG tablet Take 81 mg by mouth daily.   citalopram (CELEXA) 40 MG tablet TAKE ONE TABLET BY MOUTH EVERY MORNING   diclofenac (VOLTAREN) 75 MG EC tablet TAKE ONE TABLET BY MOUTH twice daily between meals AS NEEDED   esomeprazole (NEXIUM) 40 MG capsule Take 1 capsule (40 mg total) by mouth every morning.   fluticasone-salmeterol (ADVAIR) 250-50 MCG/ACT AEPB INHALE 1 PUFF BY MOUTH INTO LUNGS twice daily   HYDROcodone-acetaminophen (NORCO/VICODIN) 5-325 MG tablet Take 1 tablet by mouth every 6 (six) hours as needed for moderate pain.   meclizine (ANTIVERT) 25 MG tablet TAKE 1 TABLET BY MOUTH THREE TIMES DAILY AS NEEDED FOR DIZZINESS   ondansetron (ZOFRAN) 4 MG tablet TAKE 1 TABLET BY MOUTH EVERY 8 HOURS AS NEEDED   simvastatin (ZOCOR) 10 MG tablet Take 1 tablet (10 mg total) by mouth daily.   telmisartan (MICARDIS) 80 MG tablet Take 1 tablet (80 mg total) by mouth every morning.   traZODone (DESYREL) 50 MG tablet TAKE 1/2 TO 1 TABLET BY MOUTH everday AT bedtime   No facility-administered encounter medications on file as of 03/25/2022.    Reviewed chart for medication changes ahead of medication coordination call.  No OVs, Consults, or hospital visits since last care coordination call/Pharmacist visit. (If appropriate, list visit date, provider name)  No medication changes indicated OR if recent visit, treatment plan here.  BP Readings from Last 3 Encounters:  03/14/22 138/60  11/13/21 (!) 178/88  09/12/21 124/70    Lab Results  Component Value Date   HGBA1C 6.8 (H) 03/14/2022     Patient obtains  medications through Adherence Packaging  30 Days   Last adherence delivery included: (medication name and frequency)  Citalopram 40 mg; one every morning Esomeprazole 40 mg; one every morning Simvastatin 10 mg; one at bedtime Telmisartan 80 mg; one every morning Advair 250/50 1 puff twice daily   Patient is due for next adherence delivery on: 04/03/22. Called patient and reviewed medications and coordinated delivery.  This delivery to include:  Citalopram 40 mg; one every morning Esomeprazole 40 mg; one every morning Simvastatin 10 mg; one at bedtime Telmisartan 80 mg; one every morning   Declined (pt has enough on hand this month he reported) Advair 250/50 1 puff twice daily   Patient needs refills for: None   Confirmed delivery date of 04/03/22, advised patient that pharmacy will contact them the morning of delivery.   Care Gaps   AWV 05/21/21 Colonoscopy: done 08/13/16 (due 08/13/21) DM Eye Exam: N/A DM Foot Exam: N/A Microalbumin: N/A HbgAIC: done 03/14/22 (6.8) DEXA: never Mammogram: N/A     Star Rating Drugs: Telmisartan (MICARDIS) 80 MG tablet - last filled 02/28/22 30 days  Simvastatin (ZOCOR) 10 MG tablet - last filled 02/28/22 30 days    Future Appointments  Date Time Provider [place]  09/16/2022  8:40 AM Midge Minium, MD [facility]  01/08/2023 11:00 AM [facility] [place], [facility] Clinical Pharmacist Assistant  [phone]

## 2022-04-22 ENCOUNTER — Other Ambulatory Visit: Payer: Self-pay | Admitting: Family Medicine

## 2022-04-23 ENCOUNTER — Telehealth: Payer: Self-pay | Admitting: Pharmacist

## 2022-04-23 NOTE — Progress Notes (Signed)
Chronic Care Management Pharmacy Assistant   Name: Xiomar Crompton.  MRN: [id number] DOB: [birth year]   Reason for Encounter: Monthly Medication Coordination Call      Medications: Outpatient Encounter Medications as of 04/23/2022  Medication Sig   ALPRAZolam (XANAX) 0.5 MG tablet Take 1 tablet (0.5 mg total) by mouth 2 (two) times daily as needed for anxiety.   aspirin 81 MG tablet Take 81 mg by mouth daily.   citalopram (CELEXA) 40 MG tablet TAKE ONE TABLET BY MOUTH EVERY MORNING   diclofenac (VOLTAREN) 75 MG EC tablet TAKE ONE TABLET BY MOUTH twice daily between meals AS NEEDED   esomeprazole (NEXIUM) 40 MG capsule Take 1 capsule (40 mg total) by mouth every morning.   fluticasone-salmeterol (ADVAIR) 250-50 MCG/ACT AEPB INHALE 1 PUFF BY MOUTH INTO LUNGS twice daily   HYDROcodone-acetaminophen (NORCO/VICODIN) 5-325 MG tablet Take 1 tablet by mouth every 6 (six) hours as needed for moderate pain.   meclizine (ANTIVERT) 25 MG tablet TAKE 1 TABLET BY MOUTH THREE TIMES DAILY AS NEEDED FOR DIZZINESS   ondansetron (ZOFRAN) 4 MG tablet TAKE 1 TABLET BY MOUTH EVERY 8 HOURS AS NEEDED   simvastatin (ZOCOR) 10 MG tablet Take 1 tablet (10 mg total) by mouth daily.   telmisartan (MICARDIS) 80 MG tablet Take 1 tablet (80 mg total) by mouth every morning.   traZODone (DESYREL) 50 MG tablet TAKE 1/2 TO 1 TABLET BY MOUTH everday AT bedtime   No facility-administered encounter medications on file as of 04/23/2022.    Reviewed chart for medication changes ahead of medication coordination call.  No OVs, Consults, or hospital visits since last care coordination call/Pharmacist visit. (If appropriate, list visit date, provider name)  No medication changes indicated OR if recent visit, treatment plan here.  BP Readings from Last 3 Encounters:  03/14/22 138/60  11/13/21 (!) 178/88  09/12/21 124/70    Lab Results  Component Value Date   HGBA1C 6.8 (H) 03/14/2022     Patient obtains  medications through Adherence Packaging  30 Days   Last adherence delivery included: (medication name and frequency)    Citalopram 40 mg; one every morning Esomeprazole 40 mg; one every morning Simvastatin 10 mg; one at bedtime Telmisartan 80 mg; one every morning     Declined (pt has enough on hand this month he reported) Advair 250/50 1 puff twice daily   Patient is due for next adherence delivery on: 05/05/22. Called patient and reviewed medications and coordinated delivery.   This delivery to include:  Citalopram 40 mg; one every morning Esomeprazole 40 mg; one every morning Simvastatin 10 mg; one at bedtime Telmisartan 80 mg; one every morning Advair 250/50 1 puff twice daily    Patient needs refills from PCP for 30 days Citalopram 40 mg; one every morning  Confirmed delivery date of 05/05/22, advised patient that pharmacy will contact them the morning of delivery.    Care Gaps   AWV 05/21/21 Colonoscopy: done 08/13/16 (due 08/13/21) DM Eye Exam: N/A DM Foot Exam: N/A Microalbumin: N/A HbgAIC: done 03/14/22 (6.8) DEXA: never Mammogram: N/A     Star Rating Drugs: Telmisartan (MICARDIS) 80 MG tablet - last filled 03/31/22 30 days  Simvastatin (ZOCOR) 10 MG tablet - last filled 03/31/22 30 days   Future Appointments  Date Time Provider [place]  09/16/2022  8:40 AM Midge Minium, MD [facility]  01/08/2023 11:00 AM [facility] [place], [facility] Clinical  Pharmacist Assistant  [phone]

## 2022-04-28 ENCOUNTER — Other Ambulatory Visit: Payer: Self-pay | Admitting: Lab

## 2022-04-28 ENCOUNTER — Other Ambulatory Visit: Payer: Self-pay | Admitting: Family Medicine

## 2022-04-28 MED ORDER — CITALOPRAM HYDROBROMIDE 40 MG PO TABS: 40.0000 mg | ORAL_TABLET | Freq: Every morning | ORAL | 1 refills | Status: DC

## 2022-05-22 ENCOUNTER — Telehealth: Payer: Self-pay | Admitting: Pharmacist

## 2022-05-22 NOTE — Progress Notes (Signed)
Chronic Care Management Pharmacy Assistant   Name: Taylor Harvey.  MRN: [id number] DOB: August 07, 1950   Reason for Encounter: Monthly Medication Coordination Call    Medications: Outpatient Encounter Medications as of 05/22/2022  Medication Sig   ALPRAZolam (XANAX) 0.5 MG tablet Take 1 tablet (0.5 mg total) by mouth 2 (two) times daily as needed for anxiety.   aspirin 81 MG tablet Take 81 mg by mouth daily.   citalopram (CELEXA) 40 MG tablet TAKE ONE TABLET BY MOUTH EVERY MORNING   citalopram (CELEXA) 40 MG tablet Take 1 tablet (40 mg total) by mouth every morning.   diclofenac (VOLTAREN) 75 MG EC tablet TAKE ONE TABLET BY MOUTH twice daily between meals AS NEEDED   esomeprazole (NEXIUM) 40 MG capsule Take 1 capsule (40 mg total) by mouth every morning.   fluticasone-salmeterol (ADVAIR) 250-50 MCG/ACT AEPB INHALE 1 PUFF BY MOUTH INTO LUNGS twice daily   HYDROcodone-acetaminophen (NORCO/VICODIN) 5-325 MG tablet Take 1 tablet by mouth every 6 (six) hours as needed for moderate pain.   meclizine (ANTIVERT) 25 MG tablet TAKE 1 TABLET BY MOUTH THREE TIMES DAILY AS NEEDED FOR DIZZINESS   ondansetron (ZOFRAN) 4 MG tablet TAKE 1 TABLET BY MOUTH EVERY 8 HOURS AS NEEDED   simvastatin (ZOCOR) 10 MG tablet Take 1 tablet (10 mg total) by mouth daily.   telmisartan (MICARDIS) 80 MG tablet Take 1 tablet (80 mg total) by mouth every morning.   traZODone (DESYREL) 50 MG tablet TAKE 1/2 TO 1 TABLET BY MOUTH everday AT bedtime   No facility-administered encounter medications on file as of 05/22/2022.    Reviewed chart for medication changes ahead of medication coordination call.  No OVs, Consults, or hospital visits since last care coordination call/Pharmacist visit. (If appropriate, list visit date, provider name)  No medication changes indicated OR if recent visit, treatment plan here.  BP Readings from Last 3 Encounters:  03/14/22 138/60  11/13/21 (!) 178/88  09/12/21 124/70    Lab  Results  Component Value Date   HGBA1C 6.8 (H) 03/14/2022     Patient obtains medications through Adherence Packaging  30 Days   Last adherence delivery included: (medication name and frequency)  Citalopram 40 mg; one every morning Esomeprazole 40 mg; one every morning Simvastatin 10 mg; one at bedtime Telmisartan 80 mg; one every morning Advair 250/50 1 puff twice daily  Patient is due for next adherence delivery on: 06/03/21. Called patient and reviewed medications and coordinated delivery.  This delivery to include:  Citalopram 40 mg; one every morning Esomeprazole 40 mg; one every morning Simvastatin 10 mg; one at bedtime Telmisartan 80 mg; one every morning Advair 250/50 1 puff twice daily   Patient needs refills from PCP for: Advair 250/50 1 puff twice daily   Confirmed delivery date of 06/03/21, advised patient that pharmacy will contact them the morning of delivery.   Care Gaps   AWV 05/21/21 Colonoscopy: done 08/13/16 (due 08/13/21) DM Eye Exam: N/A DM Foot Exam: N/A Microalbumin: N/A HbgAIC: done 03/14/22 (6.8) DEXA: never Mammogram: N/A     Star Rating Drugs: Telmisartan (MICARDIS) 80 MG tablet - last filled 04/29/22 30 days  Simvastatin (ZOCOR) 10 MG tablet - last filled 04/29/22 30 days    Future Appointments  Date Time Provider [place]  09/16/2022  8:40 AM Midge Minium, MD [facility]  01/08/2023 11:00 AM [facility] [place], [facility] Clinical Pharmacist Assistant  [phone]

## 2022-05-28 ENCOUNTER — Other Ambulatory Visit: Payer: Self-pay | Admitting: Family Medicine

## 2022-06-20 ENCOUNTER — Telehealth: Payer: Self-pay | Admitting: Pharmacist

## 2022-06-20 NOTE — Progress Notes (Signed)
Care Management & Coordination Services Pharmacy Team   Reason for Encounter: Medication coordination and delivery  Contacted patient to discuss medications and coordinate delivery from [facility]. Spoke with patient on 06/23/22 Cycle dispensing form sent to Leata Mouse, CPP for review.    Last adherence delivery date:06/03/22      Patient is due for next adherence delivery on: 07/02/22  This delivery to include: Adherence Packaging  30 Days    Patient declined the following medications this month:  Citalopram 40 mg; one every morning Esomeprazole 40 mg; one every morning Simvastatin 10 mg; one at bedtime Telmisartan 80 mg; one every morning Advair 250/50 1 puff twice daily   No refill request needed.  Confirmed delivery date of 07/02/22, advised patient that pharmacy will contact them the morning of delivery.    Any concerns about your medications? No  How often do you forget or accidentally miss a dose? Rarely  Do you use a pillbox? No  Is patient in packaging Yes  If yes  What is the date on your next pill pack? 07/05/22  Any concerns or issues with your packaging?   Recent blood pressure readings are as follows:   Recent blood glucose readings are as follows: N/A    Medications: Outpatient Encounter Medications as of 06/20/2022  Medication Sig   ALPRAZolam (XANAX) 0.5 MG tablet Take 1 tablet (0.5 mg total) by mouth 2 (two) times daily as needed for anxiety.   aspirin 81 MG tablet Take 81 mg by mouth daily.   citalopram (CELEXA) 40 MG tablet TAKE ONE TABLET BY MOUTH EVERY MORNING   citalopram (CELEXA) 40 MG tablet Take 1 tablet (40 mg total) by mouth every morning.   diclofenac (VOLTAREN) 75 MG EC tablet TAKE ONE TABLET BY MOUTH twice daily between meals AS NEEDED   esomeprazole (NEXIUM) 40 MG capsule Take 1 capsule (40 mg total) by mouth every morning.   fluticasone-salmeterol (ADVAIR) 250-50 MCG/ACT AEPB INHALE 1 PUFF BY MOUTH INTO LUNGS twice daily    HYDROcodone-acetaminophen (NORCO/VICODIN) 5-325 MG tablet Take 1 tablet by mouth every 6 (six) hours as needed for moderate pain.   meclizine (ANTIVERT) 25 MG tablet TAKE 1 TABLET BY MOUTH THREE TIMES DAILY AS NEEDED FOR DIZZINESS   ondansetron (ZOFRAN) 4 MG tablet TAKE 1 TABLET BY MOUTH EVERY 8 HOURS AS NEEDED   simvastatin (ZOCOR) 10 MG tablet Take 1 tablet (10 mg total) by mouth daily.   telmisartan (MICARDIS) 80 MG tablet Take 1 tablet (80 mg total) by mouth every morning.   traZODone (DESYREL) 50 MG tablet TAKE 1/2 TO 1 TABLET BY MOUTH everday AT bedtime   No facility-administered encounter medications on file as of 06/20/2022.   BP Readings from Last 3 Encounters:  03/14/22 138/60  11/13/21 (!) 178/88  09/12/21 124/70    Pulse Readings from Last 3 Encounters:  03/14/22 68  11/13/21 64  09/12/21 63    Lab Results  Component Value Date/Time   HGBA1C 6.8 (H) 03/14/2022 08:41 AM   HGBA1C 6.3 04/14/2019 10:15 AM   Lab Results  Component Value Date   CREATININE 1.21 03/14/2022   BUN 13 03/14/2022   GFR 59.71 (L) 03/14/2022   GFRNONAA 89 (L) 05/04/2012   GFRAA >90 05/04/2012   NA 141 03/14/2022   K 4.5 03/14/2022   CALCIUM 9.2 03/14/2022   CO2 25 03/14/2022     Future Appointments  Date Time Provider [place]  09/16/2022  8:40 AM Midge Minium, MD [facility]  [facility]  01/08/2023 11:00 AM [facility] HEALTH COACH [facility]    [employer]

## 2022-07-20 ENCOUNTER — Other Ambulatory Visit: Payer: Self-pay | Admitting: Family Medicine

## 2022-07-21 ENCOUNTER — Other Ambulatory Visit: Payer: Self-pay | Admitting: Family Medicine

## 2022-07-23 ENCOUNTER — Telehealth: Payer: Self-pay | Admitting: Pharmacist

## 2022-07-23 NOTE — Progress Notes (Signed)
Care Management & Coordination Services Pharmacy Team  Reason for Encounter: Medication coordination and delivery  Contacted patient to discuss medications and coordinate delivery from [facility]. Spoke with patient on 07/23/2022  Cycle dispensing form sent to Leata Mouse, CPP for review.   Last adherence delivery date: 07/02/22      Patient is due for next adherence delivery on: 08/01/22  This delivery to include: Adherence Packaging  30 Days    Citalopram 40 mg; one every morning Esomeprazole 40 mg; one every morning Simvastatin 10 mg; one at bedtime Telmisartan 80 mg; one every morning Advair 250/50 1 puff twice daily  Patient declined the following medications this month: None  Refills requested from providers include:  Confirmed delivery date of 08/01/22, advised patient that pharmacy will contact them the morning of delivery.   Any concerns about your medications? No  How often do you forget or accidentally miss a dose? Rarely  Do you use a pillbox? No  Is patient in packaging Yes  If yes  What is the date on your next pill pack? 08/04/22  Any concerns or issues with your packaging?   Recent blood pressure readings are as follows:NA  Recent blood glucose readings are as follows: NA    Medications: Outpatient Encounter Medications as of 07/23/2022  Medication Sig   ALPRAZolam (XANAX) 0.5 MG tablet Take 1 tablet (0.5 mg total) by mouth 2 (two) times daily as needed for anxiety.   aspirin 81 MG tablet Take 81 mg by mouth daily.   citalopram (CELEXA) 40 MG tablet TAKE ONE TABLET BY MOUTH EVERY MORNING   citalopram (CELEXA) 40 MG tablet Take 1 tablet (40 mg total) by mouth every morning.   diclofenac (VOLTAREN) 75 MG EC tablet TAKE ONE TABLET BY MOUTH twice daily between meals AS NEEDED   esomeprazole (NEXIUM) 40 MG capsule Take 1 capsule (40 mg total) by mouth every morning.   fluticasone-salmeterol (ADVAIR) 250-50 MCG/ACT AEPB INHALE 1 PUFF BY MOUTH INTO  LUNGS twice daily   HYDROcodone-acetaminophen (NORCO/VICODIN) 5-325 MG tablet Take 1 tablet by mouth every 6 (six) hours as needed for moderate pain.   meclizine (ANTIVERT) 25 MG tablet TAKE 1 TABLET BY MOUTH THREE TIMES DAILY AS NEEDED FOR DIZZINESS   ondansetron (ZOFRAN) 4 MG tablet TAKE 1 TABLET BY MOUTH EVERY 8 HOURS AS NEEDED   simvastatin (ZOCOR) 10 MG tablet Take 1 tablet (10 mg total) by mouth daily.   telmisartan (MICARDIS) 80 MG tablet Take 1 tablet (80 mg total) by mouth every morning.   traZODone (DESYREL) 50 MG tablet TAKE 1/2 TO 1 TABLET BY MOUTH everday AT bedtime   No facility-administered encounter medications on file as of 07/23/2022.   BP Readings from Last 3 Encounters:  03/14/22 138/60  11/13/21 (!) 178/88  09/12/21 124/70    Pulse Readings from Last 3 Encounters:  03/14/22 68  11/13/21 64  09/12/21 63    Lab Results  Component Value Date/Time   HGBA1C 6.8 (H) 03/14/2022 08:41 AM   HGBA1C 6.3 04/14/2019 10:15 AM   Lab Results  Component Value Date   CREATININE 1.21 03/14/2022   BUN 13 03/14/2022   GFR 59.71 (L) 03/14/2022   GFRNONAA 89 (L) 05/04/2012   GFRAA >90 05/04/2012   NA 141 03/14/2022   K 4.5 03/14/2022   CALCIUM 9.2 03/14/2022   CO2 25 03/14/2022    Future Appointments  Date Time Provider Department Center  09/16/2022  8:40 AM Midge Minium, MD [facility]  01/08/2023 11:00 AM [facility] HEALTH COACH [facility] [place], [facility]

## 2022-07-29 ENCOUNTER — Ambulatory Visit (INDEPENDENT_AMBULATORY_CARE_PROVIDER_SITE_OTHER): Payer: PPO | Admitting: Family Medicine

## 2022-07-29 ENCOUNTER — Encounter: Payer: Self-pay | Admitting: Family Medicine

## 2022-07-29 VITALS — BP 130/80 | HR 62 | Temp 98.9°F | Resp 18 | Ht 67.0 in | Wt 246.5 lb

## 2022-07-29 DIAGNOSIS — H6993 Unspecified Eustachian tube disorder, bilateral: Secondary | ICD-10-CM

## 2022-07-29 MED ORDER — AZELASTINE HCL 0.1 % NA SOLN: 2.0000 | Freq: Two times a day (BID) | NASAL | 12 refills | Status: DC

## 2022-07-29 MED ORDER — CETIRIZINE HCL 10 MG PO TABS: 10.0000 mg | ORAL_TABLET | Freq: Every day | ORAL | 3 refills | Status: AC

## 2022-07-29 MED ORDER — ONDANSETRON HCL 4 MG PO TABS: 4.0000 mg | ORAL_TABLET | Freq: Three times a day (TID) | ORAL | 1 refills | Status: DC | PRN

## 2022-07-29 NOTE — Patient Instructions (Signed)
Follow up as needed or as scheduled START a daily Cetirizine (Zyrtec) ADD the nasal spray- 2 sprays each nostril twice daily Drink LOTS of fluids Call with any questions or concerns Stay Safe!  Stay Healthy! Safe Travels!!!

## 2022-07-29 NOTE — Progress Notes (Signed)
   Subjective:    Patient ID: Taylor Hua., male    DOB: 1950/08/20, 72 y.o.   MRN: [id number]  HPI L ear problem- pt reports he has a fluttering in L ear.  Sxs started 'a few weeks ago'.  'it sounds like someone is doing Catering manager code w/ air bubbles'.  No pain.  No fever.  No drainage from ear.  Some nasal congestion.  Not currently taking allergy medication   Review of Systems For ROS see HPI     Objective:   Physical Exam Vitals reviewed.  Constitutional:      General: He is not in acute distress.    Appearance: Normal appearance. He is not ill-appearing.  HENT:     Head: Normocephalic and atraumatic.     Right Ear: Tympanic membrane is retracted.     Left Ear: A middle ear effusion is present.     Nose: Congestion present. No rhinorrhea.     Mouth/Throat:     Mouth: Mucous membranes are moist.     Pharynx: Posterior oropharyngeal erythema (copious PND) present.  Eyes:     Extraocular Movements: Extraocular movements intact.     Conjunctiva/sclera: Conjunctivae normal.     Pupils: Pupils are equal, round, and reactive to light.  Lymphadenopathy:     Cervical: No cervical adenopathy.  Skin:    General: Skin is warm and dry.  Neurological:     General: No focal deficit present.     Mental Status: He is alert and oriented to person, place, and time.  Psychiatric:        Mood and Affect: Mood normal.        Behavior: Behavior normal.        Thought Content: Thought content normal.           Assessment & Plan:  Eustachian tube dysfxn- new.  Pt's R TM is retracted and L TM has middle ear effusion w/ visible bubbles and air fluid levels.  Start daily nasal spray and oral antihistamine.  Reviewed dx and tx w/ pt.  Pt expressed understanding and is in agreement w/ plan.

## 2022-07-30 ENCOUNTER — Other Ambulatory Visit: Payer: Self-pay

## 2022-07-30 ENCOUNTER — Other Ambulatory Visit: Payer: Self-pay | Admitting: Family Medicine

## 2022-07-30 MED ORDER — CITALOPRAM HYDROBROMIDE 40 MG PO TABS: 40.0000 mg | ORAL_TABLET | Freq: Every morning | ORAL | 0 refills | Status: DC

## 2022-08-08 DIAGNOSIS — E119 Type 2 diabetes mellitus without complications: Secondary | ICD-10-CM | POA: Diagnosis not present

## 2022-08-08 LAB — HM DIABETES EYE EXAM

## 2022-08-20 ENCOUNTER — Telehealth: Payer: Self-pay | Admitting: Pharmacist

## 2022-08-20 NOTE — Progress Notes (Signed)
Care Management & Coordination Services Pharmacy Team   Reason for Encounter: Medication coordination and delivery   Contacted patient to discuss medications and coordinate delivery from [facility]. Unsuccessful outreach. Left voicemail for patient to return call. Cycle dispensing form sent to [facility] for review.   Last adherence delivery date: 07/02/22      Patient is due for next adherence delivery on: 09/01/22  This delivery to include: Adherence Packaging  30 Days   Citalopram 40 mg; one every morning Esomeprazole 40 mg; one every morning Simvastatin 10 mg; one at bedtime Telmisartan 80 mg; one every morning Advair 250/50 1 puff twice daily  Patient declined the following medications this month: None  No refill request needed.  Confirmed delivery date of 09/01/22, advised patient that pharmacy will contact them the morning of delivery.   Any concerns about your medications? No  How often do you forget or accidentally miss a dose? Rarely  Do you use a pillbox? No  Is patient in packaging Yes  If yes  What is the date on your next pill pack?  Any concerns or issues with your packaging?   Recent blood pressure readings are as follows: N/A  Recent blood glucose readings are as follows: N/A    Medications: Outpatient Encounter Medications as of 08/20/2022  Medication Sig   ALPRAZolam (XANAX) 0.5 MG tablet Take 1 tablet (0.5 mg total) by mouth 2 (two) times daily as needed for anxiety.   aspirin 81 MG tablet Take 81 mg by mouth daily.   azelastine (ASTELIN) 0.1 % nasal spray Place 2 sprays into both nostrils 2 (two) times daily. Use in each nostril as directed   cetirizine (ZYRTEC) 10 MG tablet Take 1 tablet (10 mg total) by mouth daily.   citalopram (CELEXA) 40 MG tablet Take 1 tablet (40 mg total) by mouth every morning.   esomeprazole (NEXIUM) 40 MG capsule Take 1 capsule (40 mg total) by mouth every morning.   HYDROcodone-acetaminophen  (NORCO/VICODIN) 5-325 MG tablet Take 1 tablet by mouth every 6 (six) hours as needed for moderate pain.   meclizine (ANTIVERT) 25 MG tablet TAKE 1 TABLET BY MOUTH THREE TIMES DAILY AS NEEDED FOR DIZZINESS   ondansetron (ZOFRAN) 4 MG tablet Take 1 tablet (4 mg total) by mouth every 8 (eight) hours as needed.   simvastatin (ZOCOR) 10 MG tablet Take 1 tablet (10 mg total) by mouth daily.   telmisartan (MICARDIS) 80 MG tablet Take 1 tablet (80 mg total) by mouth every morning.   traZODone (DESYREL) 50 MG tablet TAKE 1/2 TO 1 TABLET BY MOUTH everday AT bedtime   No facility-administered encounter medications on file as of 08/20/2022.   BP Readings from Last 3 Encounters:  07/29/22 130/80  03/14/22 138/60  11/13/21 (!) 178/88    Pulse Readings from Last 3 Encounters:  07/29/22 62  03/14/22 68  11/13/21 64    Lab Results  Component Value Date/Time   HGBA1C 6.8 (H) 03/14/2022 08:41 AM   HGBA1C 6.3 04/14/2019 10:15 AM   Lab Results  Component Value Date   CREATININE 1.21 03/14/2022   BUN 13 03/14/2022   GFR 59.71 (L) 03/14/2022   GFRNONAA 89 (L) 05/04/2012   GFRAA >90 05/04/2012   NA 141 03/14/2022   K 4.5 03/14/2022   CALCIUM 9.2 03/14/2022   CO2 25 03/14/2022     Future Appointments  Date Time Provider [place]  09/16/2022  8:40 AM Midge Minium, MD [facility]  01/08/2023 11:00  AM [facility] ANNUAL WELLNESS VISIT [facility] [place], [facility]

## 2022-08-27 ENCOUNTER — Other Ambulatory Visit: Payer: Self-pay | Admitting: Family Medicine

## 2022-08-27 DIAGNOSIS — F329 Major depressive disorder, single episode, unspecified: Secondary | ICD-10-CM

## 2022-09-16 ENCOUNTER — Ambulatory Visit (INDEPENDENT_AMBULATORY_CARE_PROVIDER_SITE_OTHER): Payer: PPO | Admitting: Family Medicine

## 2022-09-16 ENCOUNTER — Encounter: Payer: Self-pay | Admitting: Family Medicine

## 2022-09-16 VITALS — BP 126/70 | HR 63 | Temp 97.7°F | Resp 14 | Ht 67.0 in | Wt 247.2 lb

## 2022-09-16 DIAGNOSIS — E119 Type 2 diabetes mellitus without complications: Secondary | ICD-10-CM

## 2022-09-16 DIAGNOSIS — Z0000 Encounter for general adult medical examination without abnormal findings: Secondary | ICD-10-CM

## 2022-09-16 DIAGNOSIS — F329 Major depressive disorder, single episode, unspecified: Secondary | ICD-10-CM | POA: Diagnosis not present

## 2022-09-16 LAB — LIPID PANEL
Cholesterol: 103 mg/dL (ref 0–200)
HDL: 33.6 mg/dL — ABNORMAL LOW (ref >39.00)
LDL Cholesterol: 52 mg/dL (ref 0–99)
NonHDL: 69.84
Total CHOL/HDL Ratio: 3
Triglycerides: 89 mg/dL (ref 0.0–149.0)
VLDL: 17.8 mg/dL (ref 0.0–40.0)

## 2022-09-16 LAB — CBC WITH DIFFERENTIAL/PLATELET
Basophils Absolute: 0 10*3/uL (ref 0.0–0.1)
Basophils Relative: 0.6 % (ref 0.0–3.0)
Eosinophils Absolute: 0.2 10*3/uL (ref 0.0–0.7)
Eosinophils Relative: 2.9 % (ref 0.0–5.0)
HCT: 42.3 % (ref 39.0–52.0)
Hemoglobin: 13.6 g/dL (ref 13.0–17.0)
Lymphocytes Relative: 29.7 % (ref 12.0–46.0)
Lymphs Abs: 1.7 10*3/uL (ref 0.7–4.0)
MCHC: 32.2 g/dL (ref 30.0–36.0)
MCV: 89.7 fl (ref 78.0–100.0)
Monocytes Absolute: 0.6 10*3/uL (ref 0.1–1.0)
Monocytes Relative: 10.1 % (ref 3.0–12.0)
Neutro Abs: 3.3 10*3/uL (ref 1.4–7.7)
Neutrophils Relative %: 56.7 % (ref 43.0–77.0)
Platelets: 201 10*3/uL (ref 150.0–400.0)
RBC: 4.71 Mil/uL (ref 4.22–5.81)
RDW: 15.9 % — ABNORMAL HIGH (ref 11.5–15.5)
WBC: 5.9 10*3/uL (ref 4.0–10.5)

## 2022-09-16 LAB — BASIC METABOLIC PANEL
BUN: 16 mg/dL (ref 6–23)
CO2: 29 mEq/L (ref 19–32)
Calcium: 9.2 mg/dL (ref 8.4–10.5)
Chloride: 105 mEq/L (ref 96–112)
Creatinine, Ser: 1.12 mg/dL (ref 0.40–1.50)
GFR: 65.28 mL/min (ref >60.00)
Glucose, Bld: 111 mg/dL — ABNORMAL HIGH (ref 70–99)
Potassium: 5.3 mEq/L — ABNORMAL HIGH (ref 3.5–5.1)
Sodium: 140 mEq/L (ref 135–145)

## 2022-09-16 LAB — HEPATIC FUNCTION PANEL
ALT: 23 U/L (ref 0–53)
AST: 22 U/L (ref 0–37)
Albumin: 4 g/dL (ref 3.5–5.2)
Alkaline Phosphatase: 79 U/L (ref 39–117)
Bilirubin, Direct: 0.1 mg/dL (ref 0.0–0.3)
Total Bilirubin: 0.4 mg/dL (ref 0.2–1.2)
Total Protein: 6.4 g/dL (ref 6.0–8.3)

## 2022-09-16 LAB — HEMOGLOBIN A1C: Hgb A1c MFr Bld: 6.8 % — ABNORMAL HIGH (ref 4.6–6.5)

## 2022-09-16 LAB — TSH: TSH: 1.41 u[IU]/mL (ref 0.35–5.50)

## 2022-09-16 MED ORDER — CITALOPRAM HYDROBROMIDE 20 MG PO TABS: ORAL_TABLET | ORAL | 3 refills | Status: DC

## 2022-09-16 MED ORDER — TRAZODONE HCL 50 MG PO TABS: ORAL_TABLET | ORAL | 1 refills | Status: DC

## 2022-09-16 NOTE — Patient Instructions (Signed)
Follow up in 6 months to recheck BP, cholesterol, sugar We'll notify you of your lab results and make any changes if needed Continue to work on low carb/low sugar diet and get regular physical activity Call with any questions or concerns Stay Safe!  Stay Healthy! ENJOY GRADUATION!!!

## 2022-09-16 NOTE — Assessment & Plan Note (Signed)
New dx at last visit.  A1C 6.8%  UTD on eye exam, foot exam done today.  Will get microalbumin.  Encouraged low carb, low sugar diet.  Foot exam done today.  Check labs.  Adjust meds prn

## 2022-09-16 NOTE — Assessment & Plan Note (Signed)
Ongoing issue.  Pt's BMI 38.72 and with his HTN, hyperlipidemia, and DM this qualifies as morbidly obese.  Encouraged healthy diet and regular exercise.  Will follow.

## 2022-09-16 NOTE — Progress Notes (Signed)
   Subjective:    Patient ID: Taylor Rings., male    DOB: [birth year], 72 y.o.   MRN: [id number]  HPI CPE- UTD on colonoscopy, Tdap, PNA  Patient Care Team    Relationship Specialty Notifications Start End  Sheliah Hatch, MD PCP - General Family Medicine  03/17/14    Comment: Lesleigh Noe, MD Consulting Physician Gastroenterology  07/21/16   Kathryne Hitch, MD Consulting Physician Orthopedic Surgery  07/23/17   Dahlia Byes, [facility] (Inactive) Pharmacist Pharmacist  08/26/19    Comment: phone number [phone]    Health Maintenance  Topic Date Due   INFLUENZA VACCINE  12/25/2022   Medicare Annual Wellness (AWV)  01/03/2023   COLONOSCOPY (Pts 45-4yrs Insurance coverage will need to be confirmed)  11/14/2026   DTaP/Tdap/Td (3 - Tdap) 09/21/2028   Pneumonia Vaccine 70+ Years old  Completed   Hepatitis C Screening  Completed   HPV VACCINES  Aged Out   COVID-19 Vaccine  Discontinued   Zoster Vaccines- Shingrix  Discontinued      Review of Systems Patient reports no vision/hearing changes, anorexia, fever ,adenopathy, persistant/recurrent hoarseness, swallowing issues, chest pain, palpitations, edema, persistant/recurrent cough, hemoptysis, dyspnea (rest,exertional, paroxysmal nocturnal), gastrointestinal  bleeding (melena, rectal bleeding), abdominal pain, excessive heart burn, GU symptoms (dysuria, hematuria, voiding/incontinence issues) syncope, focal weakness, memory loss, numbness & tingling, skin/hair/nail changes, depression, anxiety, abnormal bruising/bleeding, musculoskeletal symptoms/signs.     Objective:   Physical Exam General Appearance:    Alert, cooperative, no distress, appears stated age  Head:    Normocephalic, without obvious abnormality, atraumatic  Eyes:    PERRL, conjunctiva/corneas clear, EOM's intact both eyes       Ears:    Normal TM's and external ear canals, both ears  Nose:   Nares normal, septum midline, mucosa normal, no  drainage   or sinus tenderness  Throat:   Lips, mucosa, and tongue normal; teeth and gums normal  Neck:   Supple, symmetrical, trachea midline, no adenopathy;       thyroid:  No enlargement/tenderness/nodules  Back:     Symmetric, no curvature, ROM normal, no CVA tenderness  Lungs:     Clear to auscultation bilaterally, respirations unlabored  Chest wall:    No tenderness or deformity  Heart:    Regular rate and rhythm, S1 and S2 normal, no murmur, rub   or gallop  Abdomen:     Soft, non-tender, bowel sounds active all four quadrants,    no masses, no organomegaly  Genitalia:    deferred  Rectal:    Extremities:   Extremities normal, atraumatic, no cyanosis or edema  Pulses:   2+ and symmetric all extremities  Skin:   Skin color, texture, turgor normal, no rashes or lesions  Lymph nodes:   Cervical, supraclavicular, and axillary nodes normal  Neurologic:   CNII-XII intact. Normal strength, sensation and reflexes      throughout          Assessment & Plan:

## 2022-09-16 NOTE — Assessment & Plan Note (Signed)
Pt's PE WNL w/ exception of BMI.  UTD on colonoscopy, immunizations.  Check labs.  Anticipatory guidance provided.  ?

## 2022-09-18 ENCOUNTER — Telehealth: Payer: Self-pay | Admitting: Pharmacist

## 2022-09-18 NOTE — Progress Notes (Signed)
Care Management & Coordination Services Pharmacy Team  Reason for Encounter: Medication coordination and delivery  Contacted patient to discuss medications and coordinate delivery from [facility] pharmacy. Spoke with patient on 09/18/2022  Cycle dispensing form sent to Erskine Emery, CPP for review.   Last adherence delivery date: 09/01/22      Patient is due for next adherence delivery on: 10/01/22  This delivery to include: Adherence Packaging  30 Days   Citalopram 20 mg; one every morning *DECREASED DOSE* Esomeprazole 40 mg; one every morning Simvastatin 10 mg; one at bedtime Telmisartan 80 mg; one every morning   Patient declined the following medications this month: Advair - has plenty this month  No refill request needed.  Confirmed delivery date of 10/01/22, advised patient that pharmacy will contact them the morning of delivery.   Any concerns about your medications? No  How often do you forget or accidentally miss a dose? Rarely  Do you use a pillbox? No  Is patient in packaging Yes  If yes  What is the date on your next pill pack?  Any concerns or issues with your packaging?   Recent blood pressure readings are as follows:N/A  Recent blood glucose readings are as follows:N/A   Medications: Outpatient Encounter Medications as of 09/18/2022  Medication Sig   ALPRAZolam (XANAX) 0.5 MG tablet Take 1 tablet (0.5 mg total) by mouth 2 (two) times daily as needed for anxiety.   aspirin 81 MG tablet Take 81 mg by mouth daily.   azelastine (ASTELIN) 0.1 % nasal spray Place 2 sprays into both nostrils 2 (two) times daily. Use in each nostril as directed   cetirizine (ZYRTEC) 10 MG tablet Take 1 tablet (10 mg total) by mouth daily.   citalopram (CELEXA) 20 MG tablet Take 1 tab daily x2 weeks and then decrease to 1/2 tab daily x2 weeks and then stop   esomeprazole (NEXIUM) 40 MG capsule Take 1 capsule (40 mg total) by mouth every morning.   HYDROcodone-acetaminophen  (NORCO/VICODIN) 5-325 MG tablet Take 1 tablet by mouth every 6 (six) hours as needed for moderate pain.   meclizine (ANTIVERT) 25 MG tablet TAKE 1 TABLET BY MOUTH THREE TIMES DAILY AS NEEDED FOR DIZZINESS   ondansetron (ZOFRAN) 4 MG tablet Take 1 tablet (4 mg total) by mouth every 8 (eight) hours as needed.   simvastatin (ZOCOR) 10 MG tablet Take 1 tablet (10 mg total) by mouth daily.   telmisartan (MICARDIS) 80 MG tablet Take 1 tablet (80 mg total) by mouth every morning.   traZODone (DESYREL) 50 MG tablet TAKE 1/2 TO 1 TABLET BY MOUTH AT bedtime   No facility-administered encounter medications on file as of 09/18/2022.   BP Readings from Last 3 Encounters:  09/16/22 126/70  07/29/22 130/80  03/14/22 138/60    Pulse Readings from Last 3 Encounters:  09/16/22 63  07/29/22 62  03/14/22 68    Lab Results  Component Value Date/Time   HGBA1C 6.8 (H) 09/16/2022 09:00 AM   HGBA1C 6.8 (H) 03/14/2022 08:41 AM   Lab Results  Component Value Date   CREATININE 1.12 09/16/2022   BUN 16 09/16/2022   GFR 65.28 09/16/2022   GFRNONAA 89 (L) 05/04/2012   GFRAA >90 05/04/2012   NA 140 09/16/2022   K 5.3 (H) 09/16/2022   CALCIUM 9.2 09/16/2022   CO2 29 09/16/2022     Future Appointments  Date Time Provider Department Center  01/08/2023 11:00 AM [facility] ANNUAL WELLNESS VISIT [facility]  03/18/2023  8:40 AM Sheliah Hatch, MD [facility]    [facility] Concierge, [facility]

## 2022-10-13 DIAGNOSIS — S30860A Insect bite (nonvenomous) of lower back and pelvis, initial encounter: Secondary | ICD-10-CM | POA: Diagnosis not present

## 2022-10-13 DIAGNOSIS — L814 Other melanin hyperpigmentation: Secondary | ICD-10-CM | POA: Diagnosis not present

## 2022-10-13 DIAGNOSIS — L821 Other seborrheic keratosis: Secondary | ICD-10-CM | POA: Diagnosis not present

## 2022-10-13 DIAGNOSIS — D225 Melanocytic nevi of trunk: Secondary | ICD-10-CM | POA: Diagnosis not present

## 2022-10-21 ENCOUNTER — Telehealth: Payer: Self-pay | Admitting: Pharmacist

## 2022-10-21 ENCOUNTER — Encounter: Payer: Self-pay | Admitting: Family Medicine

## 2022-10-21 NOTE — Progress Notes (Signed)
Care Management & Coordination Services Pharmacy Team   Reason for Encounter: Medication coordination and delivery   Contacted patient to discuss medications and coordinate delivery from [facility] pharmacy. Unsuccessful outreach. Left voicemail for patient to return call. Cycle dispensing form sent to Erskine Emery, CPP for review.   Last adherence delivery date: 09/01/22      Patient is due for next adherence delivery on: 10/30/22  This delivery to include: Adherence Packaging  30 Days   Citalopram 40 mg; one every morning Esomeprazole 40 mg; one every morning Simvastatin 10 mg; one at bedtime Telmisartan 80 mg; one every morning Trazodone 50 mg 1/2-1 tablet at bedtime Advair 250/50 1 puff twice daily  Patient declined the following medications this month: N/A  Refills requested from providers include: (PCP) 30 day supply Trazodone 50mg  1/2-1 tablet at bedtime   Confirmed delivery date of 10/30/22, advised patient that pharmacy will contact them the morning of delivery.   Any concerns about your medications? No  How often do you forget or accidentally miss a dose? Rarely  Do you use a pillbox? No  Is patient in packaging Yes  If yes  What is the date on your next pill pack?  Any concerns or issues with your packaging?     Medications: Outpatient Encounter Medications as of 10/21/2022  Medication Sig   ALPRAZolam (XANAX) 0.5 MG tablet Take 1 tablet (0.5 mg total) by mouth 2 (two) times daily as needed for anxiety.   aspirin 81 MG tablet Take 81 mg by mouth daily.   azelastine (ASTELIN) 0.1 % nasal spray Place 2 sprays into both nostrils 2 (two) times daily. Use in each nostril as directed   cetirizine (ZYRTEC) 10 MG tablet Take 1 tablet (10 mg total) by mouth daily.   citalopram (CELEXA) 20 MG tablet Take 1 tab daily x2 weeks and then decrease to 1/2 tab daily x2 weeks and then stop   esomeprazole (NEXIUM) 40 MG capsule Take 1 capsule (40 mg total) by mouth every  morning.   HYDROcodone-acetaminophen (NORCO/VICODIN) 5-325 MG tablet Take 1 tablet by mouth every 6 (six) hours as needed for moderate pain.   meclizine (ANTIVERT) 25 MG tablet TAKE 1 TABLET BY MOUTH THREE TIMES DAILY AS NEEDED FOR DIZZINESS   ondansetron (ZOFRAN) 4 MG tablet Take 1 tablet (4 mg total) by mouth every 8 (eight) hours as needed.   simvastatin (ZOCOR) 10 MG tablet Take 1 tablet (10 mg total) by mouth daily.   telmisartan (MICARDIS) 80 MG tablet Take 1 tablet (80 mg total) by mouth every morning.   traZODone (DESYREL) 50 MG tablet TAKE 1/2 TO 1 TABLET BY MOUTH AT bedtime   No facility-administered encounter medications on file as of 10/21/2022.   BP Readings from Last 3 Encounters:  09/16/22 126/70  07/29/22 130/80  03/14/22 138/60    Pulse Readings from Last 3 Encounters:  09/16/22 63  07/29/22 62  03/14/22 68    Lab Results  Component Value Date/Time   HGBA1C 6.8 (H) 09/16/2022 09:00 AM   HGBA1C 6.8 (H) 03/14/2022 08:41 AM   Lab Results  Component Value Date   CREATININE 1.12 09/16/2022   BUN 16 09/16/2022   GFR 65.28 09/16/2022   GFRNONAA 89 (L) 05/04/2012   GFRAA >90 05/04/2012   NA 140 09/16/2022   K 5.3 (H) 09/16/2022   CALCIUM 9.2 09/16/2022   CO2 29 09/16/2022     Future Appointments  Date Time Provider Department Center  01/08/2023 11:00 AM [facility]  ANNUAL WELLNESS VISIT [facility]  03/18/2023  8:40 AM Sheliah Hatch, MD [facility]    [facility] Concierge, [facility]

## 2022-10-27 ENCOUNTER — Other Ambulatory Visit: Payer: Self-pay | Admitting: Family Medicine

## 2022-10-28 ENCOUNTER — Other Ambulatory Visit: Payer: Self-pay | Admitting: Family Medicine

## 2022-10-28 MED ORDER — FLUTICASONE-SALMETEROL 250-50 MCG/ACT IN AEPB: INHALATION_SPRAY | RESPIRATORY_TRACT | 3 refills | Status: DC

## 2022-10-28 NOTE — Progress Notes (Signed)
I was contacted by [facility] that pt had requested a refill of medication

## 2022-11-03 ENCOUNTER — Encounter: Payer: Self-pay | Admitting: Family Medicine

## 2022-11-03 ENCOUNTER — Ambulatory Visit (INDEPENDENT_AMBULATORY_CARE_PROVIDER_SITE_OTHER): Payer: PPO | Admitting: Family Medicine

## 2022-11-03 VITALS — BP 130/60 | HR 63 | Temp 98.0°F | Resp 18 | Ht 67.0 in | Wt 250.5 lb

## 2022-11-03 DIAGNOSIS — E119 Type 2 diabetes mellitus without complications: Secondary | ICD-10-CM | POA: Diagnosis not present

## 2022-11-03 DIAGNOSIS — R058 Other specified cough: Secondary | ICD-10-CM

## 2022-11-03 LAB — MICROALBUMIN / CREATININE URINE RATIO
Creatinine,U: 43.3 mg/dL
Microalb Creat Ratio: 1.6 mg/g (ref 0.0–30.0)
Microalb, Ur: <0.7 mg/dL (ref 0.0–1.9)

## 2022-11-03 MED ORDER — ALBUTEROL SULFATE HFA 108 (90 BASE) MCG/ACT IN AERS: 2.0000 | INHALATION_SPRAY | Freq: Four times a day (QID) | RESPIRATORY_TRACT | 0 refills | Status: DC | PRN

## 2022-11-03 MED ORDER — PREDNISONE 10 MG PO TABS: ORAL_TABLET | ORAL | 0 refills | Status: DC

## 2022-11-03 NOTE — Progress Notes (Signed)
   Subjective:    Patient ID: Taylor Rings., male    DOB: [birth year], 72 y.o.   MRN: [id number]  HPI Cough- sxs started ~1 month ago.  Has tried multiple OTC cough meds w/o relief.  Went to [facility] at onset of sxs- developed chills and low grade fever.  After that developed cough that 'has not stopped'.  If cough was not present, he would otherwise feel ok.  No sinus pain/pressure.   Review of Systems For ROS see HPI     Objective:   Physical Exam Vitals reviewed.  Constitutional:      General: He is not in acute distress.    Appearance: Normal appearance. He is well-developed. He is obese. He is not ill-appearing.  HENT:     Head: Normocephalic and atraumatic.     Right Ear: Tympanic membrane and ear canal normal.     Left Ear: Tympanic membrane and ear canal normal.     Nose: No mucosal edema, congestion or rhinorrhea.     Right Sinus: No maxillary sinus tenderness or frontal sinus tenderness.     Left Sinus: No maxillary sinus tenderness or frontal sinus tenderness.     Mouth/Throat:     Pharynx: No oropharyngeal exudate or posterior oropharyngeal erythema.  Eyes:     Conjunctiva/sclera: Conjunctivae normal.     Pupils: Pupils are equal, round, and reactive to light.  Cardiovascular:     Rate and Rhythm: Normal rate and regular rhythm.     Heart sounds: Normal heart sounds.  Pulmonary:     Effort: Pulmonary effort is normal. No respiratory distress.     Breath sounds: Normal breath sounds. No wheezing.     Comments: Dry, spasmodic cough Musculoskeletal:     Cervical back: Normal range of motion and neck supple.  Lymphadenopathy:     Cervical: No cervical adenopathy.  Skin:    General: Skin is warm and dry.  Neurological:     General: No focal deficit present.     Mental Status: He is alert and oriented to person, place, and time.           Assessment & Plan:  Post-viral cough- new.  Pt has hx of similar.  Suspect viral infection at onset of sxs and now has  airway inflammation.  Will start Prednisone taper.  Did caution him about elevated sugars w/ steroids.  Albuterol prn.  Reviewed supportive care and red flags that should prompt return.  Pt expressed understanding and is in agreement w/ plan.

## 2022-11-03 NOTE — Patient Instructions (Signed)
Follow up as needed or as scheduled START the Prednisone as directed- 3 pills at the same time x3 days, then 2 pills at the same time x3 days, then 1 pill daily.  Take w/ food  USE the Albuterol inhaler- 2 puffs as needed for coughing spells Drink LOTS of fluids Call with any questions or concerns Hang in there!!!

## 2022-11-12 ENCOUNTER — Encounter: Payer: Self-pay | Admitting: Family Medicine

## 2022-11-12 DIAGNOSIS — R052 Subacute cough: Secondary | ICD-10-CM

## 2022-11-14 ENCOUNTER — Other Ambulatory Visit: Payer: Self-pay | Admitting: Family Medicine

## 2022-11-14 ENCOUNTER — Ambulatory Visit (HOSPITAL_BASED_OUTPATIENT_CLINIC_OR_DEPARTMENT_OTHER)
Admission: RE | Admit: 2022-11-14 | Discharge: 2022-11-14 | Disposition: A | Discharge status: Home / Self Care | Payer: PPO | Source: Ambulatory Visit | Attending: Family Medicine | Admitting: Family Medicine

## 2022-11-14 DIAGNOSIS — R052 Subacute cough: Secondary | ICD-10-CM

## 2022-11-14 DIAGNOSIS — R053 Chronic cough: Secondary | ICD-10-CM | POA: Diagnosis not present

## 2022-11-14 MED ORDER — MECLIZINE HCL 25 MG PO TABS: ORAL_TABLET | ORAL | 0 refills | Status: AC

## 2022-11-17 ENCOUNTER — Other Ambulatory Visit: Payer: Self-pay | Admitting: Family Medicine

## 2022-11-17 DIAGNOSIS — E785 Hyperlipidemia, unspecified: Secondary | ICD-10-CM

## 2022-11-17 DIAGNOSIS — K219 Gastro-esophageal reflux disease without esophagitis: Secondary | ICD-10-CM

## 2022-11-21 ENCOUNTER — Telehealth: Payer: Self-pay

## 2022-11-21 NOTE — Telephone Encounter (Signed)
-----   Message from Sheliah Hatch, MD sent at 11/21/2022  1:31 PM EDT ----- Chest xray looks good.  No cause of cough identified

## 2022-11-21 NOTE — Telephone Encounter (Signed)
Pt is aware of the Chest Xray results .

## 2022-12-05 ENCOUNTER — Encounter: Payer: Self-pay | Admitting: Family Medicine

## 2022-12-05 MED ORDER — ALPRAZOLAM 0.5 MG PO TABS: 0.5000 mg | ORAL_TABLET | Freq: Two times a day (BID) | ORAL | 0 refills | Status: DC | PRN

## 2022-12-05 NOTE — Telephone Encounter (Signed)
Please return patient phone call

## 2022-12-08 ENCOUNTER — Encounter: Payer: Self-pay | Admitting: Family Medicine

## 2022-12-08 ENCOUNTER — Ambulatory Visit (INDEPENDENT_AMBULATORY_CARE_PROVIDER_SITE_OTHER): Payer: PPO | Admitting: Family Medicine

## 2022-12-08 VITALS — BP 128/60 | HR 71 | Temp 97.8°F | Resp 18 | Ht 67.0 in | Wt 255.1 lb

## 2022-12-08 DIAGNOSIS — G4700 Insomnia, unspecified: Secondary | ICD-10-CM | POA: Diagnosis not present

## 2022-12-08 DIAGNOSIS — F411 Generalized anxiety disorder: Secondary | ICD-10-CM

## 2022-12-08 MED ORDER — CITALOPRAM HYDROBROMIDE 40 MG PO TABS: 40.0000 mg | ORAL_TABLET | Freq: Every day | ORAL | 1 refills | Status: DC

## 2022-12-08 MED ORDER — CITALOPRAM HYDROBROMIDE 20 MG PO TABS
20.0000 mg | ORAL_TABLET | Freq: Every day | ORAL | 0 refills | Status: DC
Start: 2022-12-08 — End: 2023-02-02

## 2022-12-08 NOTE — Progress Notes (Signed)
   Subjective:    Patient ID: Taylor Harvey., male    DOB: [birth year], 72 y.o.   MRN: [id number]  HPI Anxiety- pt had requested to wean off his Citalopram in April.  Son is leaving for college this fall- going to [employer].  He feels like coming off the Citalopram was a mistake.  Pt felt that things were going well when he was on the medication and he started to feel poorly when coming off.  Insomnia- pt is having a hard time falling asleep despite the Trazodone 50mg .  Once he falls asleep he can stay asleep but the difficulty is when he lies down, his mind starts racing   Review of Systems For ROS see HPI     Objective:   Physical Exam Vitals reviewed.  Constitutional:      General: He is not in acute distress.    Appearance: Normal appearance. He is obese. He is not ill-appearing.  HENT:     Head: Normocephalic and atraumatic.  Eyes:     Extraocular Movements: Extraocular movements intact.     Conjunctiva/sclera: Conjunctivae normal.     Pupils: Pupils are equal, round, and reactive to light.  Skin:    General: Skin is warm and dry.  Neurological:     General: No focal deficit present.     Mental Status: He is alert and oriented to person, place, and time.  Psychiatric:        Mood and Affect: Mood normal.        Behavior: Behavior normal.        Thought Content: Thought content normal.           Assessment & Plan:

## 2022-12-08 NOTE — Assessment & Plan Note (Signed)
Deteriorated since stopping Citalopram.  Will restart at 20mg  daily and increase after 30 days to 40mg  daily.  Pt expressed understanding and is in agreement w/ plan.

## 2022-12-08 NOTE — Patient Instructions (Signed)
Follow up in 4-6 weeks to recheck mood RESTART the Citalopram 20mg  daily x1 month and then start the 40mg  daily INCREASE the Trazodone to 100mg  (2 tabs) nightly while we're working on the anxiety Call with any questions or concerns Stay Safe!  Stay Healthy! Hang in there!!!

## 2022-12-08 NOTE — Assessment & Plan Note (Signed)
Deteriorated.  Suspect this is directly related to his increased anxiety but will temporarily increase Trazodone to 100mg  daily.  Pt expressed understanding and is in agreement w/ plan.

## 2022-12-24 ENCOUNTER — Encounter (INDEPENDENT_AMBULATORY_CARE_PROVIDER_SITE_OTHER): Payer: Self-pay

## 2023-01-08 ENCOUNTER — Ambulatory Visit (INDEPENDENT_AMBULATORY_CARE_PROVIDER_SITE_OTHER): Payer: PPO | Admitting: *Deleted

## 2023-01-08 DIAGNOSIS — Z0000 Encounter for general adult medical examination without abnormal findings: Secondary | ICD-10-CM | POA: Diagnosis not present

## 2023-01-08 NOTE — Patient Instructions (Signed)
Taylor Harvey , Thank you for taking time to come for your Medicare Wellness Visit. I appreciate your ongoing commitment to your health goals. Please review the following plan we discussed and let me know if I can assist you in the future.   Screening recommendations/referrals: Colonoscopy: up to date Recommended yearly ophthalmology/optometry visit for glaucoma screening and checkup Recommended yearly dental visit for hygiene and checkup  Vaccinations: Influenza vaccine: up to date Pneumococcal vaccine: up to date Tdap vaccine: up to date Shingles vaccine: Education provided    Advanced directives: Education provided      Preventive Care 65 Years and Older, Male Preventive care refers to lifestyle choices and visits with your health care provider that can promote health and wellness. What does preventive care include? A yearly physical exam. This is also called an annual well check. Dental exams once or twice a year. Routine eye exams. Ask your health care provider how often you should have your eyes checked. Personal lifestyle choices, including: Daily care of your teeth and gums. Regular physical activity. Eating a healthy diet. Avoiding tobacco and drug use. Limiting alcohol use. Practicing safe sex. Taking low doses of aspirin every day. Taking vitamin and mineral supplements as recommended by your health care provider. What happens during an annual well check? The services and screenings done by your health care provider during your annual well check will depend on your age, overall health, lifestyle risk factors, and family history of disease. Counseling  Your health care provider may ask you questions about your: Alcohol use. Tobacco use. Drug use. Emotional well-being. Home and relationship well-being. Sexual activity. Eating habits. History of falls. Memory and ability to understand (cognition). Work and work Astronomer. Screening  You may have the following  tests or measurements: Height, weight, and BMI. Blood pressure. Lipid and cholesterol levels. These may be checked every 5 years, or more frequently if you are over 57 years old. Skin check. Lung cancer screening. You may have this screening every year starting at age 87 if you have a 30-pack-year history of smoking and currently smoke or have quit within the past 15 years. Fecal occult blood test (FOBT) of the stool. You may have this test every year starting at age 91. Flexible sigmoidoscopy or colonoscopy. You may have a sigmoidoscopy every 5 years or a colonoscopy every 10 years starting at age 43. Prostate cancer screening. Recommendations will vary depending on your family history and other risks. Hepatitis C blood test. Hepatitis B blood test. Sexually transmitted disease (STD) testing. Diabetes screening. This is done by checking your blood sugar (glucose) after you have not eaten for a while (fasting). You may have this done every 1-3 years. Abdominal aortic aneurysm (AAA) screening. You may need this if you are a current or former smoker. Osteoporosis. You may be screened starting at age 39 if you are at high risk. Talk with your health care provider about your test results, treatment options, and if necessary, the need for more tests. Vaccines  Your health care provider may recommend certain vaccines, such as: Influenza vaccine. This is recommended every year. Tetanus, diphtheria, and acellular pertussis (Tdap, Td) vaccine. You may need a Td booster every 10 years. Zoster vaccine. You may need this after age 1. Pneumococcal 13-valent conjugate (PCV13) vaccine. One dose is recommended after age 78. Pneumococcal polysaccharide (PPSV23) vaccine. One dose is recommended after age 80. Talk to your health care provider about which screenings and vaccines you need and how often you need  them. This information is not intended to replace advice given to you by your health care provider.  Make sure you discuss any questions you have with your health care provider. Document Released: 06/08/2015 Document Revised: 01/30/2016 Document Reviewed: 03/13/2015 Elsevier Interactive Patient Education  2017 [employer].  Fall Prevention in the Home Falls can cause injuries. They can happen to people of all ages. There are many things you can do to make your home safe and to help prevent falls. What can I do on the outside of my home? Regularly fix the edges of walkways and driveways and fix any cracks. Remove anything that might make you trip as you walk through a door, such as a raised step or threshold. Trim any bushes or trees on the path to your home. Use bright outdoor lighting. Clear any walking paths of anything that might make someone trip, such as rocks or tools. Regularly check to see if handrails are loose or broken. Make sure that both sides of any steps have handrails. Any raised decks and porches should have guardrails on the edges. Have any leaves, snow, or ice cleared regularly. Use sand or salt on walking paths during winter. Clean up any spills in your garage right away. This includes oil or grease spills. What can I do in the bathroom? Use night lights. Install grab bars by the toilet and in the tub and shower. Do not use towel bars as grab bars. Use non-skid mats or decals in the tub or shower. If you need to sit down in the shower, use a plastic, non-slip stool. Keep the floor dry. Clean up any water that spills on the floor as soon as it happens. Remove soap buildup in the tub or shower regularly. Attach bath mats securely with double-sided non-slip rug tape. Do not have throw rugs and other things on the floor that can make you trip. What can I do in the bedroom? Use night lights. Make sure that you have a light by your bed that is easy to reach. Do not use any sheets or blankets that are too big for your bed. They should not hang down onto the floor. Have a  firm chair that has side arms. You can use this for support while you get dressed. Do not have throw rugs and other things on the floor that can make you trip. What can I do in the kitchen? Clean up any spills right away. Avoid walking on wet floors. Keep items that you use a lot in easy-to-reach places. If you need to reach something above you, use a strong step stool that has a grab bar. Keep electrical cords out of the way. Do not use floor polish or wax that makes floors slippery. If you must use wax, use non-skid floor wax. Do not have throw rugs and other things on the floor that can make you trip. What can I do with my stairs? Do not leave any items on the stairs. Make sure that there are handrails on both sides of the stairs and use them. Fix handrails that are broken or loose. Make sure that handrails are as long as the stairways. Check any carpeting to make sure that it is firmly attached to the stairs. Fix any carpet that is loose or worn. Avoid having throw rugs at the top or bottom of the stairs. If you do have throw rugs, attach them to the floor with carpet tape. Make sure that you have a light switch at  the top of the stairs and the bottom of the stairs. If you do not have them, ask someone to add them for you. What else can I do to help prevent falls? Wear shoes that: Do not have high heels. Have rubber bottoms. Are comfortable and fit you well. Are closed at the toe. Do not wear sandals. If you use a stepladder: Make sure that it is fully opened. Do not climb a closed stepladder. Make sure that both sides of the stepladder are locked into place. Ask someone to hold it for you, if possible. Clearly mark and make sure that you can see: Any grab bars or handrails. First and last steps. Where the edge of each step is. Use tools that help you move around (mobility aids) if they are needed. These include: Canes. Walkers. Scooters. Crutches. Turn on the lights when you  go into a dark area. Replace any light bulbs as soon as they burn out. Set up your furniture so you have a clear path. Avoid moving your furniture around. If any of your floors are uneven, fix them. If there are any pets around you, be aware of where they are. Review your medicines with your doctor. Some medicines can make you feel dizzy. This can increase your chance of falling. Ask your doctor what other things that you can do to help prevent falls. This information is not intended to replace advice given to you by your health care provider. Make sure you discuss any questions you have with your health care provider. Document Released: 03/08/2009 Document Revised: 10/18/2015 Document Reviewed: 06/16/2014 Elsevier Interactive Patient Education  2017 [employer].

## 2023-01-08 NOTE — Progress Notes (Signed)
Subjective:   Taylor Harvey. is a 72 y.o. male who presents for Medicare Annual/Subsequent preventive examination.  Visit Complete: Virtual  I connected with  [name]. on 01/08/23 by a audio enabled telemedicine application and verified that I am speaking with the correct person using two identifiers.  Patient Location: Home  Provider Location: Home Office  I discussed the limitations of evaluation and management by telemedicine. The patient expressed understanding and agreed to proceed.    Vital Signs: Unable to obtain new vitals due to this being a telehealth visit.   Review of Systems     Cardiac Risk Factors include: advanced age (>28men, >37 women);male gender;family history of premature cardiovascular disease;hypertension     Objective:    There were no vitals filed for this visit. There is no height or weight on file to calculate BMI.     01/08/2023   11:19 AM 01/02/2022   10:21 AM 11/19/2020    8:05 AM 04/30/2020    9:07 AM 07/23/2017    8:24 AM 08/13/2016   10:19 AM 07/30/2016    8:32 AM  Advanced Directives  Does Patient Have a Medical Advance Directive? No Yes No No No Yes No  Type of Special educational needs teacher of [place];Living will       Does patient want to make changes to medical advance directive?    No - Patient declined     Copy of Healthcare Power of Attorney in Chart?  No - copy requested       Would patient like information on creating a medical advance directive? No - Patient declined  No - Patient declined  No - Patient declined      Current Medications (verified) Outpatient Encounter Medications as of 01/08/2023  Medication Sig   albuterol (VENTOLIN HFA) 108 (90 Base) MCG/ACT inhaler Inhale 2 puffs into the lungs every 6 (six) hours as needed for wheezing or shortness of breath.   ALPRAZolam (XANAX) 0.5 MG tablet Take 1 tablet (0.5 mg total) by mouth 2 (two) times daily as needed for anxiety.   aspirin 81 MG tablet Take 81  mg by mouth daily.   azelastine (ASTELIN) 0.1 % nasal spray Place 2 sprays into both nostrils 2 (two) times daily. Use in each nostril as directed   cetirizine (ZYRTEC) 10 MG tablet Take 1 tablet (10 mg total) by mouth daily.   citalopram (CELEXA) 40 MG tablet Take 1 tablet (40 mg total) by mouth daily.   esomeprazole (NEXIUM) 40 MG capsule TAKE ONE CAPSULE BY MOUTH EVERY MORNING   meclizine (ANTIVERT) 25 MG tablet TAKE 1 TABLET BY MOUTH THREE TIMES DAILY AS NEEDED FOR DIZZINESS   ondansetron (ZOFRAN) 4 MG tablet Take 1 tablet (4 mg total) by mouth every 8 (eight) hours as needed.   simvastatin (ZOCOR) 10 MG tablet TAKE ONE TABLET BY MOUTH EVERYDAY AT BEDTIME   telmisartan (MICARDIS) 80 MG tablet TAKE ONE TABLET BY MOUTH EVERY MORNING   traZODone (DESYREL) 50 MG tablet TAKE 1/2 TO 1 TABLET BY MOUTH AT bedtime   citalopram (CELEXA) 20 MG tablet Take 1 tablet (20 mg total) by mouth daily. (Patient not taking: Reported on 01/08/2023)   No facility-administered encounter medications on file as of 01/08/2023.    Allergies (verified) Patient has no known allergies.   History: Past Medical History:  Diagnosis Date   Allergy    Anxiety    Arthritis    Chronic kidney disease  hx kidney stones   Depression    GERD (gastroesophageal reflux disease)    Hyperlipemia    Hypertension    Sleep apnea 2005   moderate-has cpap-not used in 3 yr   Past Surgical History:  Procedure Laterality Date   COLONOSCOPY     INSERTION OF MESH  05/05/2012   Procedure: INSERTION OF MESH;  Surgeon: Emelia Loron, MD;  Location: [facility];  Service: General;  Laterality: N/A;   LITHOTRIPSY     x2   SHOULDER ARTHROSCOPY  07/2017   trigger finger surgery  2006   UMBILICAL HERNIA REPAIR  05/05/2012   Procedure: HERNIA REPAIR UMBILICAL ADULT;  Surgeon: Emelia Loron, MD;  Location: [facility];  Service: General;  Laterality: N/A;   Family History  Problem Relation Age of  Onset   Diabetes Mother    Pancreatitis Mother    Heart disease Mother 21       MI, died with MI   Stroke Father    Hypertension Father    Peripheral vascular disease Father        Carotid surgery   Heart disease Sister        Arrhythmia   Diabetes Sister 73       DM   Kidney cancer Sister    Diabetes Sister    Cancer Sister        colon   Heart disease Sister    Diabetes Sister    Heart disease Sister    Stroke Brother    Colon cancer Sister    Pancreatic cancer Other    Social History   Socioeconomic History   Marital status: Married    Spouse name: Not on file   Number of children: 6   Years of education: Not on file   Highest education level: Bachelor's degree (e.g., BA, AB, BS)  Occupational History   Occupation: retired  Tobacco Use   Smoking status: Never   Smokeless tobacco: Never  Vaping Use   Vaping status: Never Used  Substance and Sexual Activity   Alcohol use: No   Drug use: No   Sexual activity: Yes    Partners: Female  Other Topics Concern   Not on file  Social History Narrative   Exercise--walks a mile 5 days a week.  One deceased child age 13.  One stepchild, 5 adopted.    Social Determinants of Health   Financial Resource Strain: Low Risk  (01/08/2023)   Overall Financial Resource Strain (CARDIA)    Difficulty of Paying Living Expenses: Not hard at all  Food Insecurity: No Food Insecurity (01/08/2023)   Hunger Vital Sign    Worried About Running Out of Food in the Last Year: Never true    Ran Out of Food in the Last Year: Never true  Transportation Needs: No Transportation Needs (01/08/2023)   PRAPARE - Administrator, Civil Service (Medical): No    Lack of Transportation (Non-Medical): No  Physical Activity: Inactive (01/08/2023)   Exercise Vital Sign    Days of Exercise per Week: 0 days    Minutes of Exercise per Session: 0 min  Stress: Stress Concern Present (01/08/2023)   [employer] of Occupational Health -  Occupational Stress Questionnaire    Feeling of Stress : To some extent  Social Connections: Socially Integrated (01/08/2023)   Social Connection and Isolation Panel [NHANES]    Frequency of Communication with Friends and Family: Three times a week  Frequency of Social Gatherings with Friends and Family: Twice a week    Attends Religious Services: More than 4 times per year    Active Member of [employer] or Organizations: Yes    Attends Engineer, structural: More than 4 times per year    Marital Status: Married    Tobacco Counseling Counseling given: Not Answered   Clinical Intake:  Pre-visit preparation completed: Yes  Pain : No/denies pain     Diabetes: No  How often do you need to have someone help you when you read instructions, pamphlets, or other written materials from your doctor or pharmacy?: 1 - Never  Interpreter Needed?: No  Information entered by :: Remi Haggard LPN   Activities of Daily Living    01/08/2023   11:10 AM 09/16/2022    8:31 AM  In your present state of health, do you have any difficulty performing the following activities:  Hearing? 1 0  Vision? 0 0  Difficulty concentrating or making decisions? 0 0  Walking or climbing stairs? 0 0  Dressing or bathing? 0 0  Doing errands, shopping? 0 0  Preparing Food and eating ? N   Using the Toilet? N   In the past six months, have you accidently leaked urine? N   Do you have problems with loss of bowel control? N   Managing your Medications? N   Managing your Finances? N   Housekeeping or managing your Housekeeping? N     Patient Care Team: Sheliah Hatch, MD as PCP - General (Family Medicine) Myrtie Neither Andreas Blower, MD as Consulting Physician (Gastroenterology) Kathryne Hitch, MD as Consulting Physician (Orthopedic Surgery) Dahlia Byes, [facility] (Inactive) as Pharmacist (Pharmacist) [facility], [place]  Indicate any recent Medical Services you may have received from other  than [facility] providers in the past year (date may be approximate).     Assessment:   This is a routine wellness examination for [place].  Hearing/Vision screen Hearing Screening - Comments:: Some trouble hearing Vision Screening - Comments:: Up to date  [facility]  Dietary issues and exercise activities discussed:     Goals Addressed             This Visit's Progress    Weight (lb) < 200 lb (90.7 kg)         Depression Screen    01/08/2023   11:17 AM 12/08/2022    1:49 PM 11/03/2022   10:44 AM 09/16/2022    8:31 AM 07/29/2022   11:09 AM 03/14/2022    8:14 AM 01/02/2022   10:21 AM  PHQ 2/9 Scores  PHQ - 2 Score 0 2 0 0 0 0 0  PHQ- 9 Score 0 3 0 0 0 2     Fall Risk    01/08/2023   11:10 AM 12/08/2022    1:50 PM 11/03/2022   10:44 AM 09/16/2022    8:31 AM 07/29/2022   11:09 AM  Fall Risk   Falls in the past year? 1 0 0 0 1  Number falls in past yr: 0 0 0 0 0  Injury with Fall? 0 0 0 0 0  Risk for fall due to :  No Fall Risks No Fall Risks No Fall Risks History of fall(s)  Follow up Falls evaluation completed;Education provided;Falls prevention discussed Falls evaluation completed Falls evaluation completed Falls evaluation completed Falls evaluation completed    MEDICARE RISK AT HOME:  Medicare Risk at Home - 01/08/23 1110  Any stairs in or around the home? No    If so, are there any without handrails? No    Home free of loose throw rugs in walkways, pet beds, electrical cords, etc? Yes    Adequate lighting in your home to reduce risk of falls? Yes    Life alert? No    Use of a cane, walker or w/c? No    Grab bars in the bathroom? No    Shower chair or bench in shower? No    Elevated toilet seat or a handicapped toilet? Yes             TIMED UP AND GO:  Was the test performed?  No    Cognitive Function:    07/23/2017    8:27 AM  MMSE - Mini Mental State Exam  Orientation to time 5  Orientation to Place 5  Registration 3  Attention/ Calculation 5   Recall 3  Language- name 2 objects 2  Language- repeat 1  Language- follow 3 step command 3  Language- read & follow direction 1  Write a sentence 1  Copy design 1  Total score 30        01/08/2023   11:18 AM 01/02/2022   10:23 AM  6CIT Screen  What Year? 0 points 0 points  What month? 0 points 0 points  What time? 0 points 0 points  Count back from 20 0 points 0 points  Months in reverse 0 points 0 points  Repeat phrase 0 points 0 points  Total Score 0 points 0 points    Immunizations Immunization History  Administered Date(s) Administered   Influenza Split 03/29/2012   Influenza Whole 03/30/2009, 02/24/2011   Influenza, High Dose Seasonal PF 02/19/2019   Influenza,inj,Quad PF,6+ Mos 03/21/2013, 03/17/2014, 03/20/2016, 02/16/2017, 01/14/2018   Pneumococcal Conjugate-13 07/19/2014   Pneumococcal Polysaccharide-23 01/07/2016   Td 03/04/2007, 09/22/2018   Zoster, Live 03/29/2012    TDAP status: Up to date  Flu Vaccine status: Up to date  Pneumococcal vaccine status: Up to date  Covid-19 vaccine status: Information provided on how to obtain vaccines.   Qualifies for Shingles Vaccine? Yes   Zostavax completed No   Shingrix Completed?: No.    Education has been provided regarding the importance of this vaccine. Patient has been advised to call insurance company to determine out of pocket expense if they have not yet received this vaccine. Advised may also receive vaccine at local pharmacy or Health Dept. Verbalized acceptance and understanding.  Screening Tests Health Maintenance  Topic Date Due   INFLUENZA VACCINE  12/25/2022   HEMOGLOBIN A1C  03/18/2023   OPHTHALMOLOGY EXAM  08/08/2023   Diabetic kidney evaluation - eGFR measurement  09/16/2023   FOOT EXAM  09/16/2023   Diabetic kidney evaluation - Urine ACR  11/03/2023   Medicare Annual Wellness (AWV)  01/08/2024   Colonoscopy  11/14/2026   DTaP/Tdap/Td (3 - Tdap) 09/21/2028   Pneumonia Vaccine 88+ Years old   Completed   Hepatitis C Screening  Completed   HPV VACCINES  Aged Out   COVID-19 Vaccine  Discontinued   Zoster Vaccines- Shingrix  Discontinued    Health Maintenance  Health Maintenance Due  Topic Date Due   INFLUENZA VACCINE  12/25/2022    Colorectal cancer screening: Type of screening: Colonoscopy. Completed 2018. Repeat every 5 years  Lung Cancer Screening: (Low Dose CT Chest recommended if Age 22-80 years, 20 pack-year currently smoking OR have quit w/in 15years.) does not qualify.  Lung Cancer Screening Referral:   Additional Screening:  Hepatitis C Screening: does not qualify; Completed 2018  Vision Screening: Recommended annual ophthalmology exams for early detection of glaucoma and other disorders of the eye. Is the patient up to date with their annual eye exam?  Yes  Who is the provider or what is the name of the office in which the patient attends annual eye exams? [facility] If pt is not established with a provider, would they like to be referred to a provider to establish care? No .   Dental Screening: Recommended annual dental exams for proper oral hygiene    Community Resource Referral / Chronic Care Management: CRR required this visit?  No   CCM required this visit?  No     Plan:     I have personally reviewed and noted the following in the patient's chart:   Medical and social history Use of alcohol, tobacco or illicit drugs  Current medications and supplements including opioid prescriptions. Patient is currently taking opioid prescriptions. Information provided to patient regarding non-opioid alternatives. Patient advised to discuss non-opioid treatment plan with their provider. Functional ability and status Nutritional status Physical activity Advanced directives List of other physicians Hospitalizations, surgeries, and ER visits in previous 12 months Vitals Screenings to include cognitive, depression, and falls Referrals and  appointments  In addition, I have reviewed and discussed with patient certain preventive protocols, quality metrics, and best practice recommendations. A written personalized care plan for preventive services as well as general preventive health recommendations were provided to patient.     Remi Haggard, LPN   7/82/9562   After Visit Summary: (MyChart) Due to this being a telephonic visit, the after visit summary with patients personalized plan was offered to patient via MyChart   Nurse Notes:

## 2023-01-19 ENCOUNTER — Encounter: Payer: Self-pay | Admitting: Family Medicine

## 2023-01-19 ENCOUNTER — Ambulatory Visit: Payer: PPO | Admitting: Family Medicine

## 2023-01-19 VITALS — BP 138/68 | HR 64 | Temp 98.1°F | Resp 18 | Ht 67.0 in | Wt 246.5 lb

## 2023-01-19 DIAGNOSIS — F411 Generalized anxiety disorder: Secondary | ICD-10-CM

## 2023-01-19 NOTE — Assessment & Plan Note (Signed)
Improved since restarting Citalopram.  Currently on 40mg  daily.  Sleeping better.  Not interested in med changes at this time.  Will continue to follow.

## 2023-01-19 NOTE — Patient Instructions (Signed)
Follow up as needed or as scheduled No med changes at this time Keep up the good work!  You look great!! Call with any questions or concerns Stay Safe!  Stay Healthy! Enjoy the rest of your summer!!

## 2023-01-19 NOTE — Progress Notes (Signed)
   Subjective:    Patient ID: Taylor Rings., male    DOB: [birth year], 72 y.o.   MRN: [id number]  HPI Anxiety- last month restarted Citalopram.  Currently on 40mg  daily.  Feels that anxiety has improved.  Sleeping better.  Trazodone now at 100mg  nightly.  Pt feels that things are good where they are and no adjustments are needed.  Has not taken Alprazolam in ~2 weeks.     Review of Systems For ROS see HPI     Objective:   Physical Exam Vitals reviewed.  Constitutional:      General: He is not in acute distress.    Appearance: Normal appearance. He is obese. He is not ill-appearing.  HENT:     Head: Normocephalic and atraumatic.  Cardiovascular:     Rate and Rhythm: Normal rate.  Pulmonary:     Effort: Pulmonary effort is normal. No respiratory distress.  Skin:    General: Skin is warm and dry.  Neurological:     General: No focal deficit present.     Mental Status: He is alert and oriented to person, place, and time.  Psychiatric:        Mood and Affect: Mood normal.        Behavior: Behavior normal.        Thought Content: Thought content normal.           Assessment & Plan:

## 2023-01-22 DIAGNOSIS — M9903 Segmental and somatic dysfunction of lumbar region: Secondary | ICD-10-CM | POA: Diagnosis not present

## 2023-01-22 DIAGNOSIS — M9901 Segmental and somatic dysfunction of cervical region: Secondary | ICD-10-CM | POA: Diagnosis not present

## 2023-01-22 DIAGNOSIS — M9904 Segmental and somatic dysfunction of sacral region: Secondary | ICD-10-CM | POA: Diagnosis not present

## 2023-01-22 DIAGNOSIS — M9902 Segmental and somatic dysfunction of thoracic region: Secondary | ICD-10-CM | POA: Diagnosis not present

## 2023-01-27 DIAGNOSIS — M9903 Segmental and somatic dysfunction of lumbar region: Secondary | ICD-10-CM | POA: Diagnosis not present

## 2023-01-27 DIAGNOSIS — M9901 Segmental and somatic dysfunction of cervical region: Secondary | ICD-10-CM | POA: Diagnosis not present

## 2023-01-27 DIAGNOSIS — M9904 Segmental and somatic dysfunction of sacral region: Secondary | ICD-10-CM | POA: Diagnosis not present

## 2023-01-27 DIAGNOSIS — M9902 Segmental and somatic dysfunction of thoracic region: Secondary | ICD-10-CM | POA: Diagnosis not present

## 2023-01-29 DIAGNOSIS — M9903 Segmental and somatic dysfunction of lumbar region: Secondary | ICD-10-CM | POA: Diagnosis not present

## 2023-01-29 DIAGNOSIS — M9901 Segmental and somatic dysfunction of cervical region: Secondary | ICD-10-CM | POA: Diagnosis not present

## 2023-01-29 DIAGNOSIS — M9902 Segmental and somatic dysfunction of thoracic region: Secondary | ICD-10-CM | POA: Diagnosis not present

## 2023-01-29 DIAGNOSIS — M9904 Segmental and somatic dysfunction of sacral region: Secondary | ICD-10-CM | POA: Diagnosis not present

## 2023-01-30 DIAGNOSIS — M9903 Segmental and somatic dysfunction of lumbar region: Secondary | ICD-10-CM | POA: Diagnosis not present

## 2023-01-30 DIAGNOSIS — M9902 Segmental and somatic dysfunction of thoracic region: Secondary | ICD-10-CM | POA: Diagnosis not present

## 2023-01-30 DIAGNOSIS — M9901 Segmental and somatic dysfunction of cervical region: Secondary | ICD-10-CM | POA: Diagnosis not present

## 2023-01-30 DIAGNOSIS — M9904 Segmental and somatic dysfunction of sacral region: Secondary | ICD-10-CM | POA: Diagnosis not present

## 2023-02-02 ENCOUNTER — Other Ambulatory Visit: Payer: Self-pay | Admitting: Family Medicine

## 2023-02-02 DIAGNOSIS — M9904 Segmental and somatic dysfunction of sacral region: Secondary | ICD-10-CM | POA: Diagnosis not present

## 2023-02-02 DIAGNOSIS — K219 Gastro-esophageal reflux disease without esophagitis: Secondary | ICD-10-CM

## 2023-02-02 DIAGNOSIS — E785 Hyperlipidemia, unspecified: Secondary | ICD-10-CM

## 2023-02-02 DIAGNOSIS — F329 Major depressive disorder, single episode, unspecified: Secondary | ICD-10-CM

## 2023-02-02 DIAGNOSIS — M9901 Segmental and somatic dysfunction of cervical region: Secondary | ICD-10-CM | POA: Diagnosis not present

## 2023-02-02 DIAGNOSIS — M9903 Segmental and somatic dysfunction of lumbar region: Secondary | ICD-10-CM | POA: Diagnosis not present

## 2023-02-02 DIAGNOSIS — M9902 Segmental and somatic dysfunction of thoracic region: Secondary | ICD-10-CM | POA: Diagnosis not present

## 2023-02-02 MED ORDER — TELMISARTAN 80 MG PO TABS: 80.0000 mg | ORAL_TABLET | Freq: Every morning | ORAL | 1 refills | Status: DC

## 2023-02-02 MED ORDER — ESOMEPRAZOLE MAGNESIUM 40 MG PO CPDR: 40.0000 mg | DELAYED_RELEASE_CAPSULE | Freq: Every morning | ORAL | 1 refills | Status: DC

## 2023-02-02 MED ORDER — SIMVASTATIN 10 MG PO TABS: 10.0000 mg | ORAL_TABLET | Freq: Every day | ORAL | 1 refills | Status: DC

## 2023-02-02 MED ORDER — CITALOPRAM HYDROBROMIDE 20 MG PO TABS: 20.0000 mg | ORAL_TABLET | Freq: Every day | ORAL | 0 refills | Status: DC

## 2023-02-02 MED ORDER — AZELASTINE HCL 0.1 % NA SOLN: 2.0000 | Freq: Two times a day (BID) | NASAL | 12 refills | Status: AC

## 2023-02-02 MED ORDER — ALPRAZOLAM 0.5 MG PO TABS: 0.5000 mg | ORAL_TABLET | Freq: Two times a day (BID) | ORAL | 0 refills | Status: AC | PRN

## 2023-02-02 MED ORDER — CITALOPRAM HYDROBROMIDE 40 MG PO TABS: 40.0000 mg | ORAL_TABLET | Freq: Every day | ORAL | 1 refills | Status: DC

## 2023-02-02 MED ORDER — ONDANSETRON HCL 4 MG PO TABS: 4.0000 mg | ORAL_TABLET | Freq: Three times a day (TID) | ORAL | 1 refills | Status: DC | PRN

## 2023-02-02 NOTE — Telephone Encounter (Signed)
Pt has switched pharmacy's since [facility] has went out of Business .  Xanax 0.5 mg Requested Prescriptions    No prescriptions requested or ordered in this encounter     Date of patient request: 02/02/23 Last office visit: 01/19/23 Date of last refill: 12/05/22 Last refill amount: 30 Follow up time period per chart: as needed  He is requesting these refills be sent to [address]

## 2023-02-02 NOTE — Telephone Encounter (Signed)
Encourage patient to contact the pharmacy for refills or they can request refills through [facility]   WHAT PHARMACY WOULD THEY LIKE THIS SENT TO:  [facility] [address], [place] - [address]    MEDICATION NAME & DOSE: ALPRAZolam (XANAX) 0.5 MG tablet aspirin 81 MG tablet azelastine (ASTELIN) 0.1 % nasal spray cetirizine (ZYRTEC) 10 MG tablet citalopram (CELEXA) 20 MG tablet citalopram (CELEXA) 40 MG tablet esomeprazole (NEXIUM) 40 MG capsule simvastatin (ZOCOR) 10 MG tablet telmisartan (MICARDIS) 80 MG tablet ondansetron (ZOFRAN) 4 MG tablet traZODone (DESYREL) 50 MG tablet  NOTES/COMMENTS FROM PATIENT: [facility] has gone out of business pt has switched to [facility] office please notify patient: It takes 48-72 hours to process rx refill requests Ask patient to call pharmacy to ensure rx is ready before heading there.

## 2023-02-03 DIAGNOSIS — M9901 Segmental and somatic dysfunction of cervical region: Secondary | ICD-10-CM | POA: Diagnosis not present

## 2023-02-03 DIAGNOSIS — M9904 Segmental and somatic dysfunction of sacral region: Secondary | ICD-10-CM | POA: Diagnosis not present

## 2023-02-03 DIAGNOSIS — M9903 Segmental and somatic dysfunction of lumbar region: Secondary | ICD-10-CM | POA: Diagnosis not present

## 2023-02-03 DIAGNOSIS — M9902 Segmental and somatic dysfunction of thoracic region: Secondary | ICD-10-CM | POA: Diagnosis not present

## 2023-02-06 DIAGNOSIS — M9904 Segmental and somatic dysfunction of sacral region: Secondary | ICD-10-CM | POA: Diagnosis not present

## 2023-02-06 DIAGNOSIS — M9901 Segmental and somatic dysfunction of cervical region: Secondary | ICD-10-CM | POA: Diagnosis not present

## 2023-02-06 DIAGNOSIS — M9902 Segmental and somatic dysfunction of thoracic region: Secondary | ICD-10-CM | POA: Diagnosis not present

## 2023-02-06 DIAGNOSIS — M9903 Segmental and somatic dysfunction of lumbar region: Secondary | ICD-10-CM | POA: Diagnosis not present

## 2023-02-09 ENCOUNTER — Encounter: Payer: Self-pay | Admitting: Family Medicine

## 2023-02-09 DIAGNOSIS — M9902 Segmental and somatic dysfunction of thoracic region: Secondary | ICD-10-CM | POA: Diagnosis not present

## 2023-02-09 DIAGNOSIS — M9903 Segmental and somatic dysfunction of lumbar region: Secondary | ICD-10-CM | POA: Diagnosis not present

## 2023-02-09 DIAGNOSIS — M9901 Segmental and somatic dysfunction of cervical region: Secondary | ICD-10-CM | POA: Diagnosis not present

## 2023-02-09 DIAGNOSIS — M9904 Segmental and somatic dysfunction of sacral region: Secondary | ICD-10-CM | POA: Diagnosis not present

## 2023-02-10 DIAGNOSIS — M9901 Segmental and somatic dysfunction of cervical region: Secondary | ICD-10-CM | POA: Diagnosis not present

## 2023-02-10 DIAGNOSIS — M9902 Segmental and somatic dysfunction of thoracic region: Secondary | ICD-10-CM | POA: Diagnosis not present

## 2023-02-10 DIAGNOSIS — M9903 Segmental and somatic dysfunction of lumbar region: Secondary | ICD-10-CM | POA: Diagnosis not present

## 2023-02-10 DIAGNOSIS — M9904 Segmental and somatic dysfunction of sacral region: Secondary | ICD-10-CM | POA: Diagnosis not present

## 2023-02-12 DIAGNOSIS — M9903 Segmental and somatic dysfunction of lumbar region: Secondary | ICD-10-CM | POA: Diagnosis not present

## 2023-02-12 DIAGNOSIS — M9902 Segmental and somatic dysfunction of thoracic region: Secondary | ICD-10-CM | POA: Diagnosis not present

## 2023-02-12 DIAGNOSIS — M9904 Segmental and somatic dysfunction of sacral region: Secondary | ICD-10-CM | POA: Diagnosis not present

## 2023-02-12 DIAGNOSIS — M9901 Segmental and somatic dysfunction of cervical region: Secondary | ICD-10-CM | POA: Diagnosis not present

## 2023-02-16 DIAGNOSIS — M9901 Segmental and somatic dysfunction of cervical region: Secondary | ICD-10-CM | POA: Diagnosis not present

## 2023-02-16 DIAGNOSIS — M9904 Segmental and somatic dysfunction of sacral region: Secondary | ICD-10-CM | POA: Diagnosis not present

## 2023-02-16 DIAGNOSIS — M9903 Segmental and somatic dysfunction of lumbar region: Secondary | ICD-10-CM | POA: Diagnosis not present

## 2023-02-16 DIAGNOSIS — M9902 Segmental and somatic dysfunction of thoracic region: Secondary | ICD-10-CM | POA: Diagnosis not present

## 2023-02-17 ENCOUNTER — Telehealth: Payer: Self-pay

## 2023-02-17 ENCOUNTER — Other Ambulatory Visit (HOSPITAL_COMMUNITY): Payer: Self-pay

## 2023-02-17 DIAGNOSIS — M9901 Segmental and somatic dysfunction of cervical region: Secondary | ICD-10-CM | POA: Diagnosis not present

## 2023-02-17 DIAGNOSIS — M9902 Segmental and somatic dysfunction of thoracic region: Secondary | ICD-10-CM | POA: Diagnosis not present

## 2023-02-17 DIAGNOSIS — M9904 Segmental and somatic dysfunction of sacral region: Secondary | ICD-10-CM | POA: Diagnosis not present

## 2023-02-17 DIAGNOSIS — M9903 Segmental and somatic dysfunction of lumbar region: Secondary | ICD-10-CM | POA: Diagnosis not present

## 2023-02-17 NOTE — Telephone Encounter (Signed)
Pharmacy Patient Advocate Encounter   Received notification from CoverMyMeds that prior authorization for Ondansetron 4mg  tabs is required/requested.   Insurance verification completed.   The patient is insured through [facility] ADVANTAGE/RX ADVANCE .   Per test claim: PA required; PA submitted to [facility] ADVANTAGE/RX ADVANCE via Fax Key/confirmation #/EOC [id number] Status is pending

## 2023-02-18 NOTE — Telephone Encounter (Signed)
Noted. Sent to Dr.Tabori as Lorain Childes

## 2023-02-18 NOTE — Telephone Encounter (Signed)
Pharmacy Patient Advocate Encounter  Received notification from [facility] ADVANTAGE/RX ADVANCE that Prior Authorization for Ondansetron 4mg  tab has been DENIED.  Full denial letter will be uploaded to the media tab. See denial reason below.

## 2023-02-19 DIAGNOSIS — M9903 Segmental and somatic dysfunction of lumbar region: Secondary | ICD-10-CM | POA: Diagnosis not present

## 2023-02-19 DIAGNOSIS — M9902 Segmental and somatic dysfunction of thoracic region: Secondary | ICD-10-CM | POA: Diagnosis not present

## 2023-02-19 DIAGNOSIS — M9904 Segmental and somatic dysfunction of sacral region: Secondary | ICD-10-CM | POA: Diagnosis not present

## 2023-02-19 DIAGNOSIS — M9901 Segmental and somatic dysfunction of cervical region: Secondary | ICD-10-CM | POA: Diagnosis not present

## 2023-02-24 DIAGNOSIS — M9903 Segmental and somatic dysfunction of lumbar region: Secondary | ICD-10-CM | POA: Diagnosis not present

## 2023-02-24 DIAGNOSIS — M9902 Segmental and somatic dysfunction of thoracic region: Secondary | ICD-10-CM | POA: Diagnosis not present

## 2023-02-24 DIAGNOSIS — M9904 Segmental and somatic dysfunction of sacral region: Secondary | ICD-10-CM | POA: Diagnosis not present

## 2023-02-24 DIAGNOSIS — M9901 Segmental and somatic dysfunction of cervical region: Secondary | ICD-10-CM | POA: Diagnosis not present

## 2023-02-26 DIAGNOSIS — M9904 Segmental and somatic dysfunction of sacral region: Secondary | ICD-10-CM | POA: Diagnosis not present

## 2023-02-26 DIAGNOSIS — M9901 Segmental and somatic dysfunction of cervical region: Secondary | ICD-10-CM | POA: Diagnosis not present

## 2023-02-26 DIAGNOSIS — M9903 Segmental and somatic dysfunction of lumbar region: Secondary | ICD-10-CM | POA: Diagnosis not present

## 2023-02-26 DIAGNOSIS — M9902 Segmental and somatic dysfunction of thoracic region: Secondary | ICD-10-CM | POA: Diagnosis not present

## 2023-03-03 ENCOUNTER — Other Ambulatory Visit: Payer: Self-pay | Admitting: Family Medicine

## 2023-03-03 DIAGNOSIS — M9903 Segmental and somatic dysfunction of lumbar region: Secondary | ICD-10-CM | POA: Diagnosis not present

## 2023-03-03 DIAGNOSIS — M9904 Segmental and somatic dysfunction of sacral region: Secondary | ICD-10-CM | POA: Diagnosis not present

## 2023-03-03 DIAGNOSIS — M9901 Segmental and somatic dysfunction of cervical region: Secondary | ICD-10-CM | POA: Diagnosis not present

## 2023-03-03 DIAGNOSIS — M9902 Segmental and somatic dysfunction of thoracic region: Secondary | ICD-10-CM | POA: Diagnosis not present

## 2023-03-03 MED ORDER — ALBUTEROL SULFATE HFA 108 (90 BASE) MCG/ACT IN AERS: 2.0000 | INHALATION_SPRAY | Freq: Four times a day (QID) | RESPIRATORY_TRACT | 0 refills | Status: DC | PRN

## 2023-03-05 DIAGNOSIS — M9903 Segmental and somatic dysfunction of lumbar region: Secondary | ICD-10-CM | POA: Diagnosis not present

## 2023-03-05 DIAGNOSIS — M9901 Segmental and somatic dysfunction of cervical region: Secondary | ICD-10-CM | POA: Diagnosis not present

## 2023-03-05 DIAGNOSIS — M9902 Segmental and somatic dysfunction of thoracic region: Secondary | ICD-10-CM | POA: Diagnosis not present

## 2023-03-05 DIAGNOSIS — M9904 Segmental and somatic dysfunction of sacral region: Secondary | ICD-10-CM | POA: Diagnosis not present

## 2023-03-10 DIAGNOSIS — M9904 Segmental and somatic dysfunction of sacral region: Secondary | ICD-10-CM | POA: Diagnosis not present

## 2023-03-10 DIAGNOSIS — M9903 Segmental and somatic dysfunction of lumbar region: Secondary | ICD-10-CM | POA: Diagnosis not present

## 2023-03-10 DIAGNOSIS — M9901 Segmental and somatic dysfunction of cervical region: Secondary | ICD-10-CM | POA: Diagnosis not present

## 2023-03-10 DIAGNOSIS — M9902 Segmental and somatic dysfunction of thoracic region: Secondary | ICD-10-CM | POA: Diagnosis not present

## 2023-03-12 DIAGNOSIS — M9902 Segmental and somatic dysfunction of thoracic region: Secondary | ICD-10-CM | POA: Diagnosis not present

## 2023-03-12 DIAGNOSIS — M9904 Segmental and somatic dysfunction of sacral region: Secondary | ICD-10-CM | POA: Diagnosis not present

## 2023-03-12 DIAGNOSIS — M9901 Segmental and somatic dysfunction of cervical region: Secondary | ICD-10-CM | POA: Diagnosis not present

## 2023-03-12 DIAGNOSIS — M9903 Segmental and somatic dysfunction of lumbar region: Secondary | ICD-10-CM | POA: Diagnosis not present

## 2023-03-17 DIAGNOSIS — M9902 Segmental and somatic dysfunction of thoracic region: Secondary | ICD-10-CM | POA: Diagnosis not present

## 2023-03-17 DIAGNOSIS — M9901 Segmental and somatic dysfunction of cervical region: Secondary | ICD-10-CM | POA: Diagnosis not present

## 2023-03-17 DIAGNOSIS — M9904 Segmental and somatic dysfunction of sacral region: Secondary | ICD-10-CM | POA: Diagnosis not present

## 2023-03-17 DIAGNOSIS — M9903 Segmental and somatic dysfunction of lumbar region: Secondary | ICD-10-CM | POA: Diagnosis not present

## 2023-03-18 ENCOUNTER — Telehealth: Payer: Self-pay

## 2023-03-18 ENCOUNTER — Encounter: Payer: Self-pay | Admitting: Family Medicine

## 2023-03-18 ENCOUNTER — Ambulatory Visit (INDEPENDENT_AMBULATORY_CARE_PROVIDER_SITE_OTHER): Payer: PPO | Admitting: Family Medicine

## 2023-03-18 VITALS — BP 122/58 | HR 80 | Temp 97.8°F | Ht 67.0 in | Wt 252.2 lb

## 2023-03-18 DIAGNOSIS — E119 Type 2 diabetes mellitus without complications: Secondary | ICD-10-CM | POA: Diagnosis not present

## 2023-03-18 DIAGNOSIS — I10 Essential (primary) hypertension: Secondary | ICD-10-CM | POA: Diagnosis not present

## 2023-03-18 DIAGNOSIS — E785 Hyperlipidemia, unspecified: Secondary | ICD-10-CM | POA: Diagnosis not present

## 2023-03-18 LAB — BASIC METABOLIC PANEL
BUN: 18 mg/dL (ref 6–23)
CO2: 28 meq/L (ref 19–32)
Calcium: 9.4 mg/dL (ref 8.4–10.5)
Chloride: 106 meq/L (ref 96–112)
Creatinine, Ser: 1.18 mg/dL (ref 0.40–1.50)
GFR: 61.1 mL/min (ref >60.00)
Glucose, Bld: 117 mg/dL — ABNORMAL HIGH (ref 70–99)
Potassium: 4.6 meq/L (ref 3.5–5.1)
Sodium: 140 meq/L (ref 135–145)

## 2023-03-18 LAB — CBC WITH DIFFERENTIAL/PLATELET
Basophils Absolute: 0 10*3/uL (ref 0.0–0.1)
Basophils Relative: 0.6 % (ref 0.0–3.0)
Eosinophils Absolute: 0.2 10*3/uL (ref 0.0–0.7)
Eosinophils Relative: 3.4 % (ref 0.0–5.0)
HCT: 40.6 % (ref 39.0–52.0)
Hemoglobin: 12.9 g/dL — ABNORMAL LOW (ref 13.0–17.0)
Lymphocytes Relative: 29.6 % (ref 12.0–46.0)
Lymphs Abs: 1.4 10*3/uL (ref 0.7–4.0)
MCHC: 31.8 g/dL (ref 30.0–36.0)
MCV: 91 fL (ref 78.0–100.0)
Monocytes Absolute: 0.5 10*3/uL (ref 0.1–1.0)
Monocytes Relative: 10.6 % (ref 3.0–12.0)
Neutro Abs: 2.7 10*3/uL (ref 1.4–7.7)
Neutrophils Relative %: 55.8 % (ref 43.0–77.0)
Platelets: 208 10*3/uL (ref 150.0–400.0)
RBC: 4.47 Mil/uL (ref 4.22–5.81)
RDW: 14.8 % (ref 11.5–15.5)
WBC: 4.8 10*3/uL (ref 4.0–10.5)

## 2023-03-18 LAB — LIPID PANEL
Cholesterol: 115 mg/dL (ref 0–200)
HDL: 33.8 mg/dL — ABNORMAL LOW (ref >39.00)
LDL Cholesterol: 57 mg/dL (ref 0–99)
NonHDL: 81.65
Total CHOL/HDL Ratio: 3
Triglycerides: 123 mg/dL (ref 0.0–149.0)
VLDL: 24.6 mg/dL (ref 0.0–40.0)

## 2023-03-18 LAB — HEPATIC FUNCTION PANEL
ALT: 19 U/L (ref 0–53)
AST: 16 U/L (ref 0–37)
Albumin: 3.9 g/dL (ref 3.5–5.2)
Alkaline Phosphatase: 79 U/L (ref 39–117)
Bilirubin, Direct: 0.1 mg/dL (ref 0.0–0.3)
Total Bilirubin: 0.5 mg/dL (ref 0.2–1.2)
Total Protein: 6.4 g/dL (ref 6.0–8.3)

## 2023-03-18 LAB — HEMOGLOBIN A1C: Hgb A1c MFr Bld: 6.7 % — ABNORMAL HIGH (ref 4.6–6.5)

## 2023-03-18 LAB — TSH: TSH: 1.42 u[IU]/mL (ref 0.35–5.50)

## 2023-03-18 NOTE — Assessment & Plan Note (Signed)
Chronic problem.  Excellent control on Telmisartan 80mg  daily.  Currently asymptomatic.  Check labs due to ARB use but no anticipated med changes.  Will follow

## 2023-03-18 NOTE — Assessment & Plan Note (Signed)
Chronic problem.  Tolerating Simvastatin w/o difficulty.  Check labs.  Adjust meds prn  

## 2023-03-18 NOTE — Telephone Encounter (Signed)
-----   Message from Neena Rhymes sent at 03/18/2023  3:34 PM EDT ----- Labs look good!  No changes at this time

## 2023-03-18 NOTE — Progress Notes (Signed)
   Subjective:    Patient ID: Taylor Rings., male    DOB: 1950/09/12, 72 y.o.   MRN: [id number]  HPI DM- chronic problem, currently diet controlled.  UTD on eye exam, foot exam, microalbumin.  Pt reports feeling good.  No numbness/tingling of hands/feet.  HTN- chronic problem, on Telmisartan 80mg  daily w/ good control.  No CP, SOB above baseline, HA's, visual changes, edema.  Hyperlipidemia- chronic problem, on Simvastatin 10mg  daily.  No abd pain, N/V.  Declines flu shot.  Review of Systems For ROS see HPI     Objective:   Physical Exam Vitals reviewed.  Constitutional:      General: He is not in acute distress.    Appearance: Normal appearance. He is well-developed. He is obese. He is not ill-appearing.  HENT:     Head: Normocephalic and atraumatic.  Eyes:     Extraocular Movements: Extraocular movements intact.     Conjunctiva/sclera: Conjunctivae normal.     Pupils: Pupils are equal, round, and reactive to light.  Neck:     Thyroid: No thyromegaly.  Cardiovascular:     Rate and Rhythm: Normal rate and regular rhythm.     Pulses: Normal pulses.     Heart sounds: Normal heart sounds. No murmur heard. Pulmonary:     Effort: Pulmonary effort is normal. No respiratory distress.     Breath sounds: Normal breath sounds.  Abdominal:     General: Bowel sounds are normal. There is no distension.     Palpations: Abdomen is soft.  Musculoskeletal:     Cervical back: Normal range of motion and neck supple.     Right lower leg: No edema.     Left lower leg: No edema.  Lymphadenopathy:     Cervical: No cervical adenopathy.  Skin:    General: Skin is warm and dry.  Neurological:     General: No focal deficit present.     Mental Status: He is alert and oriented to person, place, and time.     Cranial Nerves: No cranial nerve deficit.  Psychiatric:        Mood and Affect: Mood normal.        Behavior: Behavior normal.           Assessment & Plan:

## 2023-03-18 NOTE — Patient Instructions (Signed)
Schedule your complete physical in 6 months We'll notify you of your lab results and make any changes if needed Continue to work on healthy diet and regular exercise- you can do it! Call with any questions or concerns Stay Safe!  Stay Healthy! Happy Fall!!! 

## 2023-03-18 NOTE — Assessment & Plan Note (Signed)
Chronic problem.  Currently diet controlled.  UTD on eye exam, foot exam, and microalbumin.  Reports feeling well.  Check labs and determine if medication is needed.

## 2023-03-19 DIAGNOSIS — M9901 Segmental and somatic dysfunction of cervical region: Secondary | ICD-10-CM | POA: Diagnosis not present

## 2023-03-19 DIAGNOSIS — M9903 Segmental and somatic dysfunction of lumbar region: Secondary | ICD-10-CM | POA: Diagnosis not present

## 2023-03-19 DIAGNOSIS — M9904 Segmental and somatic dysfunction of sacral region: Secondary | ICD-10-CM | POA: Diagnosis not present

## 2023-03-19 DIAGNOSIS — M9902 Segmental and somatic dysfunction of thoracic region: Secondary | ICD-10-CM | POA: Diagnosis not present

## 2023-03-24 DIAGNOSIS — M9901 Segmental and somatic dysfunction of cervical region: Secondary | ICD-10-CM | POA: Diagnosis not present

## 2023-03-24 DIAGNOSIS — M9904 Segmental and somatic dysfunction of sacral region: Secondary | ICD-10-CM | POA: Diagnosis not present

## 2023-03-24 DIAGNOSIS — M9902 Segmental and somatic dysfunction of thoracic region: Secondary | ICD-10-CM | POA: Diagnosis not present

## 2023-03-24 DIAGNOSIS — M9903 Segmental and somatic dysfunction of lumbar region: Secondary | ICD-10-CM | POA: Diagnosis not present

## 2023-03-26 DIAGNOSIS — M9901 Segmental and somatic dysfunction of cervical region: Secondary | ICD-10-CM | POA: Diagnosis not present

## 2023-03-26 DIAGNOSIS — M9904 Segmental and somatic dysfunction of sacral region: Secondary | ICD-10-CM | POA: Diagnosis not present

## 2023-03-26 DIAGNOSIS — M9902 Segmental and somatic dysfunction of thoracic region: Secondary | ICD-10-CM | POA: Diagnosis not present

## 2023-03-26 DIAGNOSIS — M9903 Segmental and somatic dysfunction of lumbar region: Secondary | ICD-10-CM | POA: Diagnosis not present

## 2023-03-31 DIAGNOSIS — M9903 Segmental and somatic dysfunction of lumbar region: Secondary | ICD-10-CM | POA: Diagnosis not present

## 2023-03-31 DIAGNOSIS — M9904 Segmental and somatic dysfunction of sacral region: Secondary | ICD-10-CM | POA: Diagnosis not present

## 2023-03-31 DIAGNOSIS — M9902 Segmental and somatic dysfunction of thoracic region: Secondary | ICD-10-CM | POA: Diagnosis not present

## 2023-03-31 DIAGNOSIS — M9901 Segmental and somatic dysfunction of cervical region: Secondary | ICD-10-CM | POA: Diagnosis not present

## 2023-04-02 DIAGNOSIS — M9903 Segmental and somatic dysfunction of lumbar region: Secondary | ICD-10-CM | POA: Diagnosis not present

## 2023-04-02 DIAGNOSIS — M9902 Segmental and somatic dysfunction of thoracic region: Secondary | ICD-10-CM | POA: Diagnosis not present

## 2023-04-02 DIAGNOSIS — M9904 Segmental and somatic dysfunction of sacral region: Secondary | ICD-10-CM | POA: Diagnosis not present

## 2023-04-02 DIAGNOSIS — M9901 Segmental and somatic dysfunction of cervical region: Secondary | ICD-10-CM | POA: Diagnosis not present

## 2023-04-15 ENCOUNTER — Other Ambulatory Visit: Payer: Self-pay | Admitting: Family Medicine

## 2023-04-30 ENCOUNTER — Telehealth: Payer: Self-pay | Admitting: Family Medicine

## 2023-04-30 ENCOUNTER — Other Ambulatory Visit: Payer: Self-pay

## 2023-04-30 DIAGNOSIS — F329 Major depressive disorder, single episode, unspecified: Secondary | ICD-10-CM

## 2023-04-30 MED ORDER — TRAZODONE HCL 50 MG PO TABS: ORAL_TABLET | ORAL | 1 refills | Status: DC

## 2023-04-30 MED ORDER — ALBUTEROL SULFATE HFA 108 (90 BASE) MCG/ACT IN AERS: 2.0000 | INHALATION_SPRAY | Freq: Four times a day (QID) | RESPIRATORY_TRACT | 1 refills | Status: DC | PRN

## 2023-04-30 NOTE — Telephone Encounter (Signed)
Refilled traZODone 50mg  90 day w/ 1 refill

## 2023-04-30 NOTE — Telephone Encounter (Signed)
Encourage patient to contact the pharmacy for refills or they can request refills through [facility]  (Please schedule appointment if patient has not been seen in over a year)    WHAT PHARMACY WOULD THEY LIKE THIS SENT TO: [facility] [address] [place] - [address]   MEDICATION NAME & DOSE:  traZODone (DESYREL) 50 MG tablet  Albuterol Inhaler  NOTES/COMMENTS FROM PATIENT: Pt is requesting a refill on the albuterol inhaler but it's discontinued in his chart     Front office please notify patient: It takes 48-72 hours to process rx refill requests Ask patient to call pharmacy to ensure rx is ready before heading there.

## 2023-04-30 NOTE — Telephone Encounter (Signed)
Prescription for Albuterol and Trazodone sent to pharmacy

## 2023-04-30 NOTE — Addendum Note (Signed)
Addended by: Sheliah Hatch on: 04/30/2023 03:31 PM   Modules accepted: Orders

## 2023-05-10 ENCOUNTER — Other Ambulatory Visit: Payer: Self-pay | Admitting: Family Medicine

## 2023-05-11 ENCOUNTER — Other Ambulatory Visit: Payer: Self-pay

## 2023-05-11 MED ORDER — FLUTICASONE-SALMETEROL 250-50 MCG/ACT IN AEPB
1.0000 | INHALATION_SPRAY | Freq: Two times a day (BID) | RESPIRATORY_TRACT | 1 refills | Status: AC
  Filled 2023-05-11: qty 60, 30d supply, fill #0

## 2023-05-12 ENCOUNTER — Other Ambulatory Visit (HOSPITAL_COMMUNITY): Payer: Self-pay

## 2023-06-04 ENCOUNTER — Encounter: Payer: Self-pay | Admitting: Family Medicine

## 2023-06-04 ENCOUNTER — Ambulatory Visit (INDEPENDENT_AMBULATORY_CARE_PROVIDER_SITE_OTHER): Payer: PPO | Admitting: Family Medicine

## 2023-06-04 VITALS — BP 132/64 | HR 70 | Temp 97.7°F | Ht 67.0 in | Wt 248.0 lb

## 2023-06-04 DIAGNOSIS — H66002 Acute suppurative otitis media without spontaneous rupture of ear drum, left ear: Secondary | ICD-10-CM

## 2023-06-04 MED ORDER — AMOXICILLIN 875 MG PO TABS: 875.0000 mg | ORAL_TABLET | Freq: Two times a day (BID) | ORAL | 0 refills | Status: AC

## 2023-06-04 NOTE — Progress Notes (Signed)
   Subjective:    Patient ID: Taylor Harvey., male    DOB: 07-16-1950, 73 y.o.   MRN: [id number]  HPI URI- sxs started 2 weeks ago.  + cough- productive.  + facial pain/pressure.  + tooth pain.  L ear pain.  No fever.  + HA.  + sick contacts.   Review of Systems For ROS see HPI     Objective:   Physical Exam Vitals reviewed.  Constitutional:      General: He is not in acute distress.    Appearance: He is well-developed. He is not ill-appearing.  HENT:     Head: Normocephalic and atraumatic.     Right Ear: Tympanic membrane and ear canal normal.     Left Ear: A middle ear effusion is present. Tympanic membrane is erythematous.     Nose: Congestion present.     Comments: TTP over maxillary sinuses, no TTP over frontal sinuses Eyes:     Conjunctiva/sclera: Conjunctivae normal.  Cardiovascular:     Rate and Rhythm: Normal rate and regular rhythm.  Pulmonary:     Effort: Pulmonary effort is normal. No respiratory distress.     Breath sounds: Normal breath sounds. No wheezing or rhonchi.  Musculoskeletal:     Cervical back: Neck supple.  Lymphadenopathy:     Cervical: No cervical adenopathy.  Skin:    General: Skin is warm and dry.  Neurological:     General: No focal deficit present.     Mental Status: He is alert and oriented to person, place, and time.  Psychiatric:        Mood and Affect: Mood normal.        Behavior: Behavior normal.           Assessment & Plan:  L OM- new.  Pt w/ obvious infxn on PE.  Start Amoxicillin  BID.  This will also treat sinusitis if present.  Reviewed supportive care and red flags that should prompt return.  Pt expressed understanding and is in agreement w/ plan.

## 2023-06-04 NOTE — Patient Instructions (Signed)
 Follow up as needed or as scheduled START the Amoxicillin twice daily- take w/ food Drink LOTS of fluids REST! Robitussin or Delsym as needed for cough Call with any questions or concerns Stay Safe! HAPPY BIRTHDAY!!!

## 2023-07-28 ENCOUNTER — Ambulatory Visit: Admitting: Family Medicine

## 2023-07-28 ENCOUNTER — Encounter: Payer: Self-pay | Admitting: Family Medicine

## 2023-07-28 VITALS — BP 144/74 | HR 72 | Temp 97.9°F

## 2023-07-28 DIAGNOSIS — R051 Acute cough: Secondary | ICD-10-CM | POA: Diagnosis not present

## 2023-07-28 DIAGNOSIS — H66002 Acute suppurative otitis media without spontaneous rupture of ear drum, left ear: Secondary | ICD-10-CM

## 2023-07-28 LAB — POC INFLUENZA A&B (BINAX/QUICKVUE)
Influenza A, POC: NEGATIVE
Influenza B, POC: NEGATIVE

## 2023-07-28 MED ORDER — PROMETHAZINE-DM 6.25-15 MG/5ML PO SYRP: 5.0000 mL | ORAL_SOLUTION | Freq: Four times a day (QID) | ORAL | 0 refills | Status: DC | PRN

## 2023-07-28 MED ORDER — AMOXICILLIN 875 MG PO TABS: 875.0000 mg | ORAL_TABLET | Freq: Two times a day (BID) | ORAL | 0 refills | Status: AC

## 2023-07-28 NOTE — Addendum Note (Signed)
 Addended by: Ester Rink on: 07/28/2023 09:35 AM   Modules accepted: Orders

## 2023-07-28 NOTE — Patient Instructions (Signed)
 Follow up as needed or as scheduled START the Amoxicillin twice daily- take w/ food USE the cough syrup as needed- may cause drowsiness Drink LOTS of fluids REST! Call with any questions or concerns Hang in there!!

## 2023-07-28 NOTE — Progress Notes (Signed)
   Subjective:    Patient ID: Taylor Rings., male    DOB: [birth year], 73 y.o.   MRN: [id number]  HPI URI- 'i went to [facility]'.  Developed cough on Wednesday.  Body aches on Friday.  + fever- subjective.  + chills.  + HA.  No ear pain.  Cough was dry until last night and then became productive.  + sick contacts.   Review of Systems For ROS see HPI     Objective:   Physical Exam Vitals reviewed.  Constitutional:      General: He is not in acute distress.    Appearance: Normal appearance. He is not ill-appearing.  HENT:     Head: Normocephalic and atraumatic.     Right Ear: Tympanic membrane and ear canal normal.     Left Ear: A middle ear effusion is present. Tympanic membrane is erythematous and bulging.     Nose: Congestion present. No rhinorrhea.     Comments: No TTP over frontal or maxillary sinuses Eyes:     Extraocular Movements: Extraocular movements intact.     Conjunctiva/sclera: Conjunctivae normal.  Cardiovascular:     Rate and Rhythm: Normal rate and regular rhythm.     Pulses: Normal pulses.  Pulmonary:     Effort: Pulmonary effort is normal. No respiratory distress.     Breath sounds: No wheezing or rhonchi.  Musculoskeletal:     Cervical back: Neck supple.  Lymphadenopathy:     Cervical: No cervical adenopathy.  Neurological:     Mental Status: He is alert.           Assessment & Plan:  L otitis media- new.  Pt has notable opaque effusion.  Hx of similar.  Start Amoxicillin BID.  Reviewed supportive care and red flags that should prompt return.  Pt expressed understanding and is in agreement w/ plan.

## 2023-07-29 ENCOUNTER — Other Ambulatory Visit: Payer: Self-pay | Admitting: Family Medicine

## 2023-08-10 DIAGNOSIS — E119 Type 2 diabetes mellitus without complications: Secondary | ICD-10-CM | POA: Diagnosis not present

## 2023-08-13 ENCOUNTER — Telehealth: Payer: Self-pay

## 2023-08-13 MED ORDER — CITALOPRAM HYDROBROMIDE 40 MG PO TABS: 40.0000 mg | ORAL_TABLET | Freq: Every day | ORAL | 1 refills | Status: DC

## 2023-08-13 NOTE — Telephone Encounter (Signed)
 Refill placed patient needs prior auth for this

## 2023-08-13 NOTE — Telephone Encounter (Signed)
 Copied from CRM [id number]. Topic: Clinical - Prescription Issue >> Aug 13, 2023  3:12 PM Kathryne Eriksson wrote: Reason for CRM: citalopram (CELEXA) 40 MG tablet >> Aug 13, 2023  3:12 PM Kathryne Eriksson wrote: Patient states he needs a prior authorization in order to have this medication refilled.

## 2023-08-14 ENCOUNTER — Encounter: Payer: Self-pay | Admitting: Physician Assistant

## 2023-08-14 ENCOUNTER — Other Ambulatory Visit (INDEPENDENT_AMBULATORY_CARE_PROVIDER_SITE_OTHER): Payer: Self-pay

## 2023-08-14 ENCOUNTER — Telehealth: Payer: Self-pay

## 2023-08-14 ENCOUNTER — Other Ambulatory Visit (HOSPITAL_COMMUNITY): Payer: Self-pay

## 2023-08-14 ENCOUNTER — Ambulatory Visit: Admitting: Physician Assistant

## 2023-08-14 DIAGNOSIS — M79672 Pain in left foot: Secondary | ICD-10-CM

## 2023-08-14 NOTE — Progress Notes (Signed)
 Office Visit Note   Patient: Taylor Harvey.           Date of Birth: 1951/05/19           MRN: [id number] Visit Date: 08/14/2023              Requested by: Sheliah Hatch, MD [address] [place],  [place] [address] PCP: Sheliah Hatch, MD   Assessment & Plan: Visit Diagnoses:  1. Pain in left foot     Plan: Patient is a pleasant 73 year old gentleman who works as an Environmental consultant at [employer].  1 week ago he was hit on the medial side of his left foot.  He did have some bruising and ecchymosis and mild pain at the impact however more of his pain is laterally.  He denies any previous history.  X-rays do show degenerative changes no acute fractures are noted.  Pain seems to be more in the posterior lateral aspect of the heel.  There is no swelling there is some resolving ecchymosis he has a mild tenderness medially.  I do not see any red flags at this time and would have him wear a stiff soled shoe take anti-inflammatories as well as topical Voltaren gel.  I gave him some exercises to try will follow-up with me in 2 weeks if he had continued pain could consider an MRI  Follow-Up Instructions: Return in about 2 weeks (around 08/28/2023).   Orders:  Orders Placed This Encounter  Procedures   XR Foot 2 Views Left   No orders of the defined types were placed in this encounter.     Procedures: No procedures performed   Clinical Data: No additional findings.   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  only has pain when putting weight on it     HPI patient is a pleasant 73 year old gentleman with a chief complaint of pain on the outside of his left foot x 1 week after being hit by a baseball  Review of Systems  All other systems reviewed and are negative.    Objective: Vital Signs: There were  no vitals taken for this visit.  Physical Exam Constitutional:      Appearance: Normal appearance.  Pulmonary:     Effort: Pulmonary effort is normal.  Skin:    General: Skin is warm and dry.  Neurological:     General: No focal deficit present.     Mental Status: He is alert and oriented to person, place, and time.  Psychiatric:        Mood and Affect: Mood normal.        Behavior: Behavior normal.     Ortho Exam Examination of the left foot is pulses intact he has some mild resolving bruising on the medial side of his foot over the navicular.  Mild tenderness.  No tenderness through the mid midfoot or toes no tenderness on the dorsum of the foot no tenderness at the base of the fifth metatarsal laterally he does have some tenderness just inferior to the lateral malleolus no instability noted Specialty Comments:  No specialty comments available.  Imaging: XR Foot 2 Views Left Result Date: 08/14/2023 Three-view radiographs of the left foot demonstrate no acute fractures there is some ossification probably  consistent with an os peroneum but does not look acute    PMFS History: Patient Active Problem List   Diagnosis Date Noted   Controlled type 2 diabetes mellitus without complication, without long-term current use of insulin (HCC) 09/16/2022   GERD (gastroesophageal reflux disease) 04/14/2019   Status post arthroscopy of left shoulder 10/15/2017   Lateral epicondylitis, left elbow 04/08/2017   Chronic left shoulder pain 02/17/2017   Pain in left elbow 02/17/2017   Left upper arm pain 01/20/2017   Insomnia 07/19/2014   Urinary frequency 07/19/2014   Physical exam 07/19/2014   Morbid obesity (HCC) 06/28/2013   Hyperlipidemia 11/24/2008   Anxiety state 06/02/2008   NEOPLASM, SKIN, UNCERTAIN BEHAVIOR 05/22/2008   Trigger finger, acquired 03/02/2008   BACK PAIN WITH RADICULOPATHY 03/02/2008   Backache 07/15/2007   DYSPNEA ON EXERTION 11/09/2006   POLYP, COLON 07/19/2006    Essential hypertension 07/19/2006   Sleep apnea 07/19/2006   Diverticulosis of colon 06/11/2006   Past Medical History:  Diagnosis Date   Allergy    Anxiety    Arthritis    Chronic kidney disease    hx kidney stones   Depression    GERD (gastroesophageal reflux disease)    Hyperlipemia    Hypertension    Sleep apnea 2005   moderate-has cpap-not used in 3 yr    Family History  Problem Relation Age of Onset   Diabetes Mother    Pancreatitis Mother    Heart disease Mother 20       MI, died with MI   Stroke Father    Hypertension Father    Peripheral vascular disease Father        Carotid surgery   Heart disease Sister        Arrhythmia   Diabetes Sister 101       DM   Kidney cancer Sister    Diabetes Sister    Cancer Sister        colon   Heart disease Sister    Diabetes Sister    Heart disease Sister    Stroke Brother    Colon cancer Sister    Pancreatic cancer Other     Past Surgical History:  Procedure Laterality Date   COLONOSCOPY     INSERTION OF MESH  05/05/2012   Procedure: INSERTION OF MESH;  Surgeon: Emelia Loron, MD;  Location: [facility];  Service: General;  Laterality: N/A;   LITHOTRIPSY     x2   SHOULDER ARTHROSCOPY  07/2017   trigger finger surgery  2006   UMBILICAL HERNIA REPAIR  05/05/2012   Procedure: HERNIA REPAIR UMBILICAL ADULT;  Surgeon: Emelia Loron, MD;  Location: [facility];  Service: General;  Laterality: N/A;   Social History   Occupational History   Occupation: retired  Tobacco Use   Smoking status: Never   Smokeless tobacco: Never  Vaping Use   Vaping status: Never Used  Substance and Sexual Activity   Alcohol use: No   Drug use: No   Sexual activity: Yes    Partners: Female

## 2023-08-14 NOTE — Telephone Encounter (Signed)
 Pharmacy Patient Advocate Encounter   Received notification from Pt Calls Messages that prior authorization for CITALOPRAM 40MG  is required/requested.   Insurance verification completed.   The patient is insured through [facility] [employer] .   Per test claim: Refill too soon. PA is not needed at this time. Medication was filled 08/13/23. Next eligible fill date is 10/20/23.

## 2023-08-14 NOTE — Telephone Encounter (Signed)
 Per test claim: Refill too soon. PA is not needed at this time. Medication was filled 08/13/23. Next eligible fill date is 10/20/23.

## 2023-08-21 ENCOUNTER — Encounter: Payer: Self-pay | Admitting: Student in an Organized Health Care Education/Training Program

## 2023-08-21 ENCOUNTER — Ambulatory Visit: Admitting: Student in an Organized Health Care Education/Training Program

## 2023-08-21 VITALS — BP 138/60 | HR 66 | Temp 98.1°F | Ht 67.0 in | Wt 248.1 lb

## 2023-08-21 DIAGNOSIS — R350 Frequency of micturition: Secondary | ICD-10-CM | POA: Diagnosis not present

## 2023-08-21 DIAGNOSIS — R399 Unspecified symptoms and signs involving the genitourinary system: Secondary | ICD-10-CM

## 2023-08-21 LAB — POCT URINALYSIS DIPSTICK
Bilirubin, UA: NEGATIVE
Blood, UA: NEGATIVE
Glucose, UA: NEGATIVE
Ketones, UA: NEGATIVE
Leukocytes, UA: NEGATIVE
Nitrite, UA: NEGATIVE
Protein, UA: NEGATIVE
Spec Grav, UA: 1.025 (ref 1.010–1.025)
Urobilinogen, UA: 0.2 U/dL
pH, UA: 6 (ref 5.0–8.0)

## 2023-08-21 LAB — PSA: PSA: 0.52 ng/mL (ref 0.10–4.00)

## 2023-08-21 MED ORDER — TAMSULOSIN HCL 0.4 MG PO CAPS
0.4000 mg | ORAL_CAPSULE | Freq: Every day | ORAL | 3 refills | Status: DC
Start: 2023-08-21 — End: 2024-02-05

## 2023-08-21 NOTE — Assessment & Plan Note (Signed)
 Acute on chronic issue, symptom burden has progressed to become more severe.  He is having a sensation of frequency, urgency, weak stream.  No signs of acute urine retention on exam today.  I suspect he probably has some chronic urinary retention.  PVR not available in our office today.  Urinalysis is without any signs of infection.  Will plan to check PSA today to rule out new prostate cancer.  Last checked in 2023 and has been fine.  Will start Flomax 0.4 mg daily.  I talked to him about side effects.  If this is not helpful then may need to try something for overactive bladder like Myrbetriq.

## 2023-08-21 NOTE — Progress Notes (Signed)
 Acute Office Visit  Subjective:     Patient ID: Taylor Manley., male    DOB: [birth year], 72 y.o.   MRN: [id number]  Chief Complaint  Patient presents with   Urinary Frequency    Pt reports "leaking" urination -Describes- he can fill of half shot glass Sx started this morning He can not make it to bathroom if he needs to urinate     HPI  Patient is in today for urinary frequency and incontinence.  73 year old person with obesity, hypertension here for worsening symptoms of bladder fullness, and urinary frequency/urgency over the last week.  No dysuria.  No pain with urination.  No fevers or chills.  He does reports a while of having a weak stream.  No issues with starting and stopping.  No nocturia.  Over the last week he says he has been urinating about every hour.  He urinates a small amount of normal-appearing urine.  Two times this morning he had times of incontinence because he did not make it to the bathroom in time.  Denies any numbness or tingling.  No back pain.  No changes in strength.  Or gait.      Objective:    BP 138/60   Pulse 66   Temp 98.1 F (36.7 C)   Ht 5\' 7"  (1.702 m)   Wt 248 lb 2 oz (112.5 kg)   SpO2 98%   BMI 38.86 kg/m    Physical Exam  General: Well-appearing man Abdomen: Soft nontender nondistended, normal to percussion, cannot palpate the bladder Extremities: Warm well-perfused, no lower extremity edema   Results for orders placed or performed in visit on 08/21/23  POCT urinalysis dipstick  Result Value Ref Range   Color, UA     Clarity, UA     Glucose, UA Negative Negative   Bilirubin, UA neg    Ketones, UA neg    Spec Grav, UA 1.025 1.010 - 1.025   Blood, UA neg    pH, UA 6.0 5.0 - 8.0   Protein, UA Negative Negative   Urobilinogen, UA 0.2 0.2 or 1.0 E.U./dL   Nitrite, UA neg    Leukocytes, UA Negative Negative   Appearance     Odor          Assessment & Plan:   Problem List Items Addressed This Visit        Unprioritized   Lower urinary tract symptoms (LUTS) - Primary   Acute on chronic issue, symptom burden has progressed to become more severe.  He is having a sensation of frequency, urgency, weak stream.  No signs of acute urine retention on exam today.  I suspect he probably has some chronic urinary retention.  PVR not available in our office today.  Urinalysis is without any signs of infection.  Will plan to check PSA today to rule out new prostate cancer.  Last checked in 2023 and has been fine.  Will start Flomax 0.4 mg daily.  I talked to him about side effects.  If this is not helpful then may need to try something for overactive bladder like Myrbetriq.      Relevant Medications   tamsulosin (FLOMAX) 0.4 MG CAPS capsule   Other Relevant Orders   PSA   Other Visit Diagnoses       Urine frequency       Relevant Orders   POCT urinalysis dipstick (Completed)       Meds ordered this encounter  Medications  tamsulosin (FLOMAX) 0.4 MG CAPS capsule    Sig: Take 1 capsule (0.4 mg total) by mouth daily.    Dispense:  30 capsule    Refill:  3    No follow-ups on file.  Tyson Alias, MD

## 2023-08-24 NOTE — Telephone Encounter (Signed)
 I spoke with the patient by phone.  He reported some pink urine Friday evening.  Earlier that day our urinalysis had no signs of hematuria.  Urine color is now returned to normal.  He had good improvement in his bladder control symptoms since starting Flomax.  No further issues with incontinence no other side effects.  I told him that the PSA number was normal putting him at very low risk for prostate cancer.  Plan to continue Flomax.  If he has further color changes that could be possible hematuria, I asked him to come to the office to leave urinalysis.

## 2023-09-03 ENCOUNTER — Encounter: Payer: Self-pay | Admitting: Family Medicine

## 2023-09-03 ENCOUNTER — Ambulatory Visit: Admitting: Family Medicine

## 2023-09-03 ENCOUNTER — Encounter: Payer: Self-pay | Admitting: Student in an Organized Health Care Education/Training Program

## 2023-09-03 VITALS — BP 130/54 | HR 73 | Temp 97.3°F | Ht 67.0 in | Wt 250.4 lb

## 2023-09-03 DIAGNOSIS — R350 Frequency of micturition: Secondary | ICD-10-CM

## 2023-09-03 DIAGNOSIS — M79672 Pain in left foot: Secondary | ICD-10-CM

## 2023-09-03 LAB — POCT URINALYSIS DIPSTICK
Bilirubin, UA: NEGATIVE
Glucose, UA: NEGATIVE
Ketones, UA: NEGATIVE
Nitrite, UA: NEGATIVE
Protein, UA: POSITIVE — AB
Spec Grav, UA: 1.025 (ref 1.010–1.025)
Urobilinogen, UA: 0.2 U/dL
pH, UA: 5.5 (ref 5.0–8.0)

## 2023-09-03 MED ORDER — CEPHALEXIN 500 MG PO CAPS
500.0000 mg | ORAL_CAPSULE | Freq: Two times a day (BID) | ORAL | 0 refills | Status: DC
Start: 2023-09-03 — End: 2023-12-28

## 2023-09-03 NOTE — Telephone Encounter (Signed)
 Mri ordered, pt advised via mychart

## 2023-09-03 NOTE — Patient Instructions (Signed)
 Follow up as needed or as scheduled START the Cephalexin twice daily x5 days Drink LOTS of water to flush through your kidneys and bladder Try and void on a schedule to avoid having to run Call with any questions or concerns Hang in there!!

## 2023-09-03 NOTE — Progress Notes (Signed)
   Subjective:    Patient ID: Taylor Im., male    DOB: 1950-07-04, 73 y.o.   MRN: [id number]  HPI Urinary frequency- sxs started ~3 weeks ago when he was 'unable to control my bladder'.  Saw Dr Gayl Katos and was started on Flomax.  This helped temporarily but he is still not able to hold his bladder for any period of time.  + odor.  Did have some burning w/ urination but this has improved.  Did have 1 episode of blood in urine.  Has some TTP over bladder.     Review of Systems For ROS see HPI     Objective:   Physical Exam Vitals reviewed.  Constitutional:      General: He is not in acute distress.    Appearance: Normal appearance. He is obese. He is not ill-appearing.  HENT:     Head: Normocephalic and atraumatic.  Cardiovascular:     Rate and Rhythm: Normal rate and regular rhythm.  Pulmonary:     Effort: Pulmonary effort is normal. No respiratory distress.  Abdominal:     General: There is no distension.     Palpations: Abdomen is soft.     Tenderness: There is no abdominal tenderness. There is no right CVA tenderness, left CVA tenderness, guarding or rebound.  Neurological:     General: No focal deficit present.     Mental Status: He is alert and oriented to person, place, and time.  Psychiatric:        Mood and Affect: Mood normal.        Behavior: Behavior normal.           Assessment & Plan:  Urinary frequency- new.  Sxs are consistent w/ UTI- frequency, odor, burning, hematuria.  Will start abx and await cx results.  Reviewed supportive care and red flags that should prompt return.  Pt expressed understanding and is in agreement w/ plan.

## 2023-09-05 LAB — URINE CULTURE
MICRO NUMBER:: 16314195
SPECIMEN QUALITY:: ADEQUATE

## 2023-09-07 ENCOUNTER — Ambulatory Visit
Admission: RE | Admit: 2023-09-07 | Discharge: 2023-09-07 | Disposition: A | Discharge status: Home / Self Care | Source: Ambulatory Visit | Attending: Physician Assistant | Admitting: Physician Assistant

## 2023-09-07 DIAGNOSIS — M79672 Pain in left foot: Secondary | ICD-10-CM | POA: Diagnosis not present

## 2023-09-07 DIAGNOSIS — R600 Localized edema: Secondary | ICD-10-CM | POA: Diagnosis not present

## 2023-09-14 ENCOUNTER — Telehealth: Payer: Self-pay

## 2023-09-14 ENCOUNTER — Encounter: Payer: Self-pay | Admitting: Family Medicine

## 2023-09-14 NOTE — Telephone Encounter (Signed)
 Pt reports he is feeling better- no sx

## 2023-09-14 NOTE — Telephone Encounter (Signed)
-----   Message from Laymon Priest sent at 09/14/2023  7:39 AM EDT ----- Your urine did show infection and was treated appropriately with the antibiotics.  I hope you are feeling better!

## 2023-09-16 ENCOUNTER — Other Ambulatory Visit: Payer: Self-pay | Admitting: Family Medicine

## 2023-09-17 ENCOUNTER — Telehealth: Payer: Self-pay

## 2023-09-17 ENCOUNTER — Ambulatory Visit: Payer: PPO | Admitting: Family Medicine

## 2023-09-17 ENCOUNTER — Encounter: Payer: Self-pay | Admitting: Family Medicine

## 2023-09-17 VITALS — BP 120/50 | HR 74 | Temp 98.1°F | Ht 67.0 in | Wt 247.1 lb

## 2023-09-17 DIAGNOSIS — Z0000 Encounter for general adult medical examination without abnormal findings: Secondary | ICD-10-CM | POA: Diagnosis not present

## 2023-09-17 DIAGNOSIS — E119 Type 2 diabetes mellitus without complications: Secondary | ICD-10-CM | POA: Diagnosis not present

## 2023-09-17 DIAGNOSIS — Z6838 Body mass index (BMI) 38.0-38.9, adult: Secondary | ICD-10-CM | POA: Diagnosis not present

## 2023-09-17 LAB — BASIC METABOLIC PANEL WITH GFR
BUN: 13 mg/dL (ref 6–23)
CO2: 28 meq/L (ref 19–32)
Calcium: 9 mg/dL (ref 8.4–10.5)
Chloride: 104 meq/L (ref 96–112)
Creatinine, Ser: 1.15 mg/dL (ref 0.40–1.50)
GFR: 62.8 mL/min (ref >60.00)
Glucose, Bld: 116 mg/dL — ABNORMAL HIGH (ref 70–99)
Potassium: 4.4 meq/L (ref 3.5–5.1)
Sodium: 140 meq/L (ref 135–145)

## 2023-09-17 LAB — CBC WITH DIFFERENTIAL/PLATELET
Basophils Absolute: 0 10*3/uL (ref 0.0–0.1)
Basophils Relative: 0.3 % (ref 0.0–3.0)
Eosinophils Absolute: 0.1 10*3/uL (ref 0.0–0.7)
Eosinophils Relative: 1.3 % (ref 0.0–5.0)
HCT: 39.6 % (ref 39.0–52.0)
Hemoglobin: 12.9 g/dL — ABNORMAL LOW (ref 13.0–17.0)
Lymphocytes Relative: 13.6 % (ref 12.0–46.0)
Lymphs Abs: 1.3 10*3/uL (ref 0.7–4.0)
MCHC: 32.5 g/dL (ref 30.0–36.0)
MCV: 90.5 fl (ref 78.0–100.0)
Monocytes Absolute: 0.7 10*3/uL (ref 0.1–1.0)
Monocytes Relative: 8 % (ref 3.0–12.0)
Neutro Abs: 7.1 10*3/uL (ref 1.4–7.7)
Neutrophils Relative %: 76.8 % (ref 43.0–77.0)
Platelets: 222 10*3/uL (ref 150.0–400.0)
RBC: 4.38 Mil/uL (ref 4.22–5.81)
RDW: 14.4 % (ref 11.5–15.5)
WBC: 9.3 10*3/uL (ref 4.0–10.5)

## 2023-09-17 LAB — MICROALBUMIN / CREATININE URINE RATIO
Creatinine,U: 164.6 mg/dL
Microalb Creat Ratio: 28.4 mg/g (ref 0.0–30.0)
Microalb, Ur: 4.7 mg/dL — ABNORMAL HIGH (ref 0.0–1.9)

## 2023-09-17 LAB — LIPID PANEL
Cholesterol: 98 mg/dL (ref 0–200)
HDL: 35.9 mg/dL — ABNORMAL LOW (ref >39.00)
LDL Cholesterol: 43 mg/dL (ref 0–99)
NonHDL: 62.54
Total CHOL/HDL Ratio: 3
Triglycerides: 99 mg/dL (ref 0.0–149.0)
VLDL: 19.8 mg/dL (ref 0.0–40.0)

## 2023-09-17 LAB — HEMOGLOBIN A1C: Hgb A1c MFr Bld: 6.7 % — ABNORMAL HIGH (ref 4.6–6.5)

## 2023-09-17 LAB — HEPATIC FUNCTION PANEL
ALT: 19 U/L (ref 0–53)
AST: 17 U/L (ref 0–37)
Albumin: 4 g/dL (ref 3.5–5.2)
Alkaline Phosphatase: 82 U/L (ref 39–117)
Bilirubin, Direct: 0.2 mg/dL (ref 0.0–0.3)
Total Bilirubin: 0.7 mg/dL (ref 0.2–1.2)
Total Protein: 6.7 g/dL (ref 6.0–8.3)

## 2023-09-17 LAB — TSH: TSH: 0.97 u[IU]/mL (ref 0.35–5.50)

## 2023-09-17 NOTE — Assessment & Plan Note (Signed)
 Foot exam done today.  Will get microalbumin today.  UTD on eye exam.  Check labs.  Adjust meds prn

## 2023-09-17 NOTE — Assessment & Plan Note (Signed)
 Pt's PE unchanged and WNL w/ exception of BMI.  UTD on colonoscopy, Tdap, PNA.  Foot exam done today.  Check labs.  Anticipatory guidance provided.

## 2023-09-17 NOTE — Progress Notes (Signed)
   Subjective:    Patient ID: Taylor Harvey., male    DOB: July 17, 1950, 73 y.o.   MRN: [id number]  HPI CPE- UTD on eye exam, colonoscopy, Tdap, PNA.  Due for foot exam.  Patient Care Team    Relationship Specialty Notifications Start End  Jess Morita, MD PCP - General Family Medicine  03/17/14    Comment: Artemisa Lars, MD Consulting Physician Gastroenterology  07/21/16   Arnie Lao, MD Consulting Physician Orthopedic Surgery  07/23/17   Rolando Cliche, [facility] Pharmacist Pharmacist  08/26/19    Comment: phone number [phone]  [facility] [place]    12/02/22   [facility]    09/17/23      Health Maintenance  Topic Date Due   COVID-19 Vaccine (1) Never done   OPHTHALMOLOGY EXAM  08/08/2023   HEMOGLOBIN A1C  09/16/2023   Diabetic kidney evaluation - Urine ACR  11/03/2023   INFLUENZA VACCINE  12/25/2023   Medicare Annual Wellness (AWV)  01/08/2024   Diabetic kidney evaluation - eGFR measurement  03/17/2024   FOOT EXAM  09/16/2024   Colonoscopy  11/14/2026   DTaP/Tdap/Td (3 - Tdap) 09/21/2028   Pneumonia Vaccine 24+ Years old  Completed   Hepatitis C Screening  Completed   HPV VACCINES  Aged Out   Meningococcal B Vaccine  Aged Out   Zoster Vaccines- Shingrix  Discontinued      Review of Systems Patient reports no vision/hearing changes, anorexia, fever ,adenopathy, persistant/recurrent hoarseness, swallowing issues, chest pain, palpitations, edema, persistant/recurrent cough, hemoptysis, dyspnea (rest,exertional, paroxysmal nocturnal), gastrointestinal  bleeding (melena, rectal bleeding), abdominal pain, excessive heart burn, GU symptoms (dysuria, hematuria, voiding/incontinence issues) syncope, focal weakness, memory loss, numbness & tingling, skin/hair/nail changes, depression, anxiety, abnormal bruising/bleeding, musculoskeletal symptoms/signs.     Objective:   Physical Exam General Appearance:    Alert, cooperative, no distress,  appears stated age, obese  Head:    Normocephalic, without obvious abnormality, atraumatic  Eyes:    PERRL, conjunctiva/corneas clear, EOM's intact both eyes       Ears:    Normal TM's and external ear canals, both ears  Nose:   Nares normal, septum midline, mucosa normal, no drainage   or sinus tenderness  Throat:   Lips, mucosa, and tongue normal; teeth and gums normal  Neck:   Supple, symmetrical, trachea midline, no adenopathy;       thyroid :  No enlargement/tenderness/nodules  Back:     Symmetric, no curvature, ROM normal, no CVA tenderness  Lungs:     Clear to auscultation bilaterally, respirations unlabored  Chest wall:    No tenderness or deformity  Heart:    Regular rate and rhythm, S1 and S2 normal, no murmur, rub   or gallop  Abdomen:     Soft, non-tender, bowel sounds active all four quadrants,    no masses, no organomegaly  Genitalia:    deferred  Rectal:    Extremities:   Extremities normal, atraumatic, no cyanosis or edema  Pulses:   2+ and symmetric all extremities  Skin:   Skin color, texture, turgor normal, no rashes or lesions  Lymph nodes:   Cervical, supraclavicular, and axillary nodes normal  Neurologic:   CNII-XII intact. Normal strength, sensation and reflexes      throughout          Assessment & Plan:

## 2023-09-17 NOTE — Telephone Encounter (Signed)
 Pt has been notified.

## 2023-09-17 NOTE — Patient Instructions (Addendum)
Follow up in 6 months to recheck BP, cholesterol, diabetes ?We'll notify you of your lab results and make any changes if needed ?Continue to work on healthy diet and regular exercise- you can do it!! ?Call with any questions or concerns ?Stay Safe!  Stay Healthy! ?Happy Spring!! ?

## 2023-09-17 NOTE — Assessment & Plan Note (Signed)
 Ongoing issue.  Pt is down 3 lbs since beginning of month.  Encouraged healthy diet and regular exercise.

## 2023-09-17 NOTE — Telephone Encounter (Signed)
-----   Message from Laymon Priest sent at 09/17/2023  3:45 PM EDT ----- Labs are stable and look good!  No changes at this time

## 2023-09-22 ENCOUNTER — Encounter: Payer: Self-pay | Admitting: Family Medicine

## 2023-10-13 DIAGNOSIS — L821 Other seborrheic keratosis: Secondary | ICD-10-CM | POA: Diagnosis not present

## 2023-10-13 DIAGNOSIS — D225 Melanocytic nevi of trunk: Secondary | ICD-10-CM | POA: Diagnosis not present

## 2023-10-13 DIAGNOSIS — L570 Actinic keratosis: Secondary | ICD-10-CM | POA: Diagnosis not present

## 2023-10-13 DIAGNOSIS — L814 Other melanin hyperpigmentation: Secondary | ICD-10-CM | POA: Diagnosis not present

## 2023-11-11 ENCOUNTER — Other Ambulatory Visit: Payer: Self-pay

## 2023-11-11 DIAGNOSIS — E785 Hyperlipidemia, unspecified: Secondary | ICD-10-CM

## 2023-11-11 DIAGNOSIS — K219 Gastro-esophageal reflux disease without esophagitis: Secondary | ICD-10-CM

## 2023-11-11 MED ORDER — ESOMEPRAZOLE MAGNESIUM 40 MG PO CPDR: 40.0000 mg | DELAYED_RELEASE_CAPSULE | Freq: Every morning | ORAL | 1 refills | Status: DC

## 2023-11-11 MED ORDER — SIMVASTATIN 10 MG PO TABS: 10.0000 mg | ORAL_TABLET | Freq: Every day | ORAL | 1 refills | Status: DC

## 2023-11-13 ENCOUNTER — Encounter: Payer: Self-pay | Admitting: Family Medicine

## 2023-11-25 ENCOUNTER — Encounter: Payer: Self-pay | Admitting: Family Medicine

## 2023-11-25 DIAGNOSIS — N200 Calculus of kidney: Secondary | ICD-10-CM

## 2023-11-25 DIAGNOSIS — E785 Hyperlipidemia, unspecified: Secondary | ICD-10-CM

## 2023-11-26 MED ORDER — HYDROCODONE-ACETAMINOPHEN 5-325 MG PO TABS: 1.0000 | ORAL_TABLET | Freq: Four times a day (QID) | ORAL | 0 refills | Status: DC | PRN

## 2023-11-26 MED ORDER — TELMISARTAN 80 MG PO TABS: 80.0000 mg | ORAL_TABLET | Freq: Every morning | ORAL | 1 refills | Status: DC

## 2023-12-28 ENCOUNTER — Ambulatory Visit (INDEPENDENT_AMBULATORY_CARE_PROVIDER_SITE_OTHER): Admitting: Family Medicine

## 2023-12-28 ENCOUNTER — Ambulatory Visit: Payer: Self-pay

## 2023-12-28 VITALS — BP 110/54 | HR 97 | Temp 97.9°F | Ht 67.0 in | Wt 243.5 lb

## 2023-12-28 DIAGNOSIS — R109 Unspecified abdominal pain: Secondary | ICD-10-CM

## 2023-12-28 DIAGNOSIS — W57XXXA Bitten or stung by nonvenomous insect and other nonvenomous arthropods, initial encounter: Secondary | ICD-10-CM | POA: Diagnosis not present

## 2023-12-28 DIAGNOSIS — S30861A Insect bite (nonvenomous) of abdominal wall, initial encounter: Secondary | ICD-10-CM

## 2023-12-28 LAB — POCT URINALYSIS DIPSTICK
Bilirubin, UA: NEGATIVE
Blood, UA: POSITIVE
Clarity, UA: NEGATIVE
Glucose, UA: NEGATIVE
Ketones, UA: NEGATIVE
Nitrite, UA: NEGATIVE
Protein, UA: POSITIVE — AB
Spec Grav, UA: 1.015 (ref 1.010–1.025)
Urobilinogen, UA: 0.2 U/dL
pH, UA: 6 (ref 5.0–8.0)

## 2023-12-28 LAB — CBC WITH DIFFERENTIAL/PLATELET
Basophils Absolute: 0 10*3/uL (ref 0.0–0.1)
Basophils Relative: 0.5 % (ref 0.0–3.0)
Eosinophils Absolute: 0.2 10*3/uL (ref 0.0–0.7)
Eosinophils Relative: 2.6 % (ref 0.0–5.0)
HCT: 38.2 % — ABNORMAL LOW (ref 39.0–52.0)
Hemoglobin: 12.5 g/dL — ABNORMAL LOW (ref 13.0–17.0)
Lymphocytes Relative: 8.6 % — ABNORMAL LOW (ref 12.0–46.0)
Lymphs Abs: 0.6 10*3/uL — ABNORMAL LOW (ref 0.7–4.0)
MCHC: 32.6 g/dL (ref 30.0–36.0)
MCV: 89 fl (ref 78.0–100.0)
Monocytes Absolute: 0.9 10*3/uL (ref 0.1–1.0)
Monocytes Relative: 13.2 % — ABNORMAL HIGH (ref 3.0–12.0)
Neutro Abs: 5 10*3/uL (ref 1.4–7.7)
Neutrophils Relative %: 75.1 % (ref 43.0–77.0)
Platelets: 162 10*3/uL (ref 150.0–400.0)
RBC: 4.3 Mil/uL (ref 4.22–5.81)
RDW: 15.3 % (ref 11.5–15.5)
WBC: 6.7 10*3/uL (ref 4.0–10.5)

## 2023-12-28 MED ORDER — CEPHALEXIN 500 MG PO CAPS: 500.0000 mg | ORAL_CAPSULE | Freq: Two times a day (BID) | ORAL | 0 refills | Status: DC

## 2023-12-28 NOTE — Telephone Encounter (Signed)
 FYI Only or Action Required?: Action required by provider: request for appointment.  Patient was last seen in primary care on 09/17/2023 by Mahlon Comer BRAVO, MD.  Called Nurse Triage reporting Flank Pain.  Symptoms began several days ago.  Interventions attempted: Prescription medications: hydrocodone .  Symptoms are: unchanged. States Thursday started having left flank pain like when I had a kidney stone. No pain now. Has fever(unsure of reading),weakness, chills ,foul-smelling urine. Has appointment scheduled per the practice, Cassie. Pt. Will call back for worsening of symptoms.  Triage Disposition: See Physician Within 24 Hours  Patient/caregiver understands and will follow disposition?:     Reason for Disposition  MODERATE pain (e.g., interferes with normal activities or awakens from sleep)  Answer Assessment - Initial Assessment Questions 1. LOCATION: Where does it hurt? (e.g., left, right)     Left side, but gone now 2. ONSET: When did the pain start?     Thursday night the pain started 3. SEVERITY: How bad is the pain? (e.g., Scale 1-10; mild, moderate, or severe)     severe 4. PATTERN: Does the pain come and go, or is it constant?      Gone now  5. CAUSE: What do you think is causing the pain?     Maybe a kidney stone 6. OTHER SYMPTOMS:  Do you have any other symptoms? (e.g., fever, abdomen pain, vomiting, leg weakness, burning with urination, blood in urine)     Fever,chills 7. PREGNANCY:  Is there any chance you are pregnant? When was your last menstrual period?     N/a  Protocols used: Flank Pain-A-AH

## 2023-12-28 NOTE — Progress Notes (Unsigned)
   Subjective:    Patient ID: Taylor Harvey., male    DOB: 27-Jun-1950, 73 y.o.   MRN: [id number]  HPI L flank pain- pt reports Thursday night developed L flank pain, 'just like kidney stones in the past'.  Got home, took a hydrocodone .  Vomited.  Then developed chills, 'bouts of nausea'.  + subjective fever.  Denies flank pain at this time.  Denies dysuria.  + drops of visible blood this morning.  Has not seen urology recently.  Tick bite- R lower abd.  Removed on 7/18- engorged.  No rashes.  Occasional HA.     Review of Systems For ROS see HPI     Objective:   Physical Exam Vitals reviewed.  Constitutional:      General: He is not in acute distress.    Appearance: Normal appearance. He is obese. He is not ill-appearing.  HENT:     Head: Normocephalic and atraumatic.  Cardiovascular:     Rate and Rhythm: Normal rate and regular rhythm.  Pulmonary:     Effort: Pulmonary effort is normal. No respiratory distress.     Breath sounds: No wheezing.  Abdominal:     General: There is no distension.     Palpations: Abdomen is soft.     Tenderness: There is no abdominal tenderness. There is no right CVA tenderness, left CVA tenderness or guarding.  Skin:    General: Skin is warm and dry.     Findings: Erythema (R lower abd at site of tick bite- no induration or fluctuance) present.  Neurological:     General: No focal deficit present.     Mental Status: He is alert and oriented to person, place, and time.  Psychiatric:        Mood and Affect: Mood normal.        Behavior: Behavior normal.        Thought Content: Thought content normal.           Assessment & Plan:  Flank pain- new.  Pt has hx of stones but has not seen urology recently.  On Thursday night he reported pain consistent w/ previous stones but has not had pain since.  Now has occasional nausea, chills.  Denies dysuria, frequency, urgency but did see a few drops of blood.  Hx of recent UTI.  Will get CT to assess  for stones and start abx in case of infxn.  Reviewed supportive care and red flags that should prompt return.  Pt expressed understanding and is in agreement w/ plan.   Tick bite- new.  R abdomen.  Area is not infected but given his recent subjective fever and chills will check for tick borne illness.  Pt expressed understanding and is in agreement w/ plan.

## 2023-12-28 NOTE — Telephone Encounter (Signed)
 FYI

## 2023-12-28 NOTE — Patient Instructions (Signed)
 Follow up as needed or as scheduled We'll notify you of your lab results and make any changes if needed START the Cephalexin  twice daily Drink LOTS of fluids They will call you to schedule your CT scan to assess for stones Call with any questions or concerns Hang in there!!

## 2023-12-29 ENCOUNTER — Ambulatory Visit (HOSPITAL_BASED_OUTPATIENT_CLINIC_OR_DEPARTMENT_OTHER)
Admission: RE | Admit: 2023-12-29 | Discharge: 2023-12-29 | Disposition: A | Discharge status: Home / Self Care | Source: Ambulatory Visit | Attending: Family Medicine | Admitting: Family Medicine

## 2023-12-29 ENCOUNTER — Ambulatory Visit: Payer: Self-pay | Admitting: Family Medicine

## 2023-12-29 ENCOUNTER — Encounter: Payer: Self-pay | Admitting: Family Medicine

## 2023-12-29 DIAGNOSIS — R109 Unspecified abdominal pain: Secondary | ICD-10-CM | POA: Diagnosis not present

## 2023-12-29 DIAGNOSIS — N202 Calculus of kidney with calculus of ureter: Secondary | ICD-10-CM | POA: Diagnosis not present

## 2023-12-29 DIAGNOSIS — K5730 Diverticulosis of large intestine without perforation or abscess without bleeding: Secondary | ICD-10-CM | POA: Diagnosis not present

## 2023-12-29 DIAGNOSIS — N200 Calculus of kidney: Secondary | ICD-10-CM

## 2023-12-29 LAB — LYME DISEASE SEROLOGY W/REFLEX: Lyme Total Antibody EIA: NEGATIVE

## 2023-12-30 DIAGNOSIS — N201 Calculus of ureter: Secondary | ICD-10-CM | POA: Diagnosis not present

## 2023-12-30 DIAGNOSIS — R350 Frequency of micturition: Secondary | ICD-10-CM | POA: Diagnosis not present

## 2023-12-30 DIAGNOSIS — R351 Nocturia: Secondary | ICD-10-CM | POA: Diagnosis not present

## 2023-12-30 LAB — URINE CULTURE
MICRO NUMBER:: 16783159
SPECIMEN QUALITY:: ADEQUATE

## 2023-12-30 NOTE — Progress Notes (Signed)
 Called patient and relayed lab results. Patient verbalized understanding of labs. Urology appointment went well. He will update us  next week.

## 2023-12-31 LAB — SPOTTED FEVER GROUP ANTIBODIES
Spotted Fever Group IgG: <1:64 {titer}
Spotted Fever Group IgM: <1:64 {titer}

## 2023-12-31 NOTE — Progress Notes (Signed)
 Pt has reviewed labs via MyChart

## 2024-01-05 DIAGNOSIS — N201 Calculus of ureter: Secondary | ICD-10-CM | POA: Diagnosis not present

## 2024-01-05 DIAGNOSIS — N3000 Acute cystitis without hematuria: Secondary | ICD-10-CM | POA: Diagnosis not present

## 2024-01-06 ENCOUNTER — Other Ambulatory Visit: Payer: Self-pay | Admitting: Urology

## 2024-01-06 NOTE — Patient Instructions (Signed)
 SURGICAL WAITING ROOM VISITATION  Patients having surgery or a procedure may have no more than 2 support people in the waiting area - these visitors may rotate.    Children under the age of 8 must have an adult with them who is not the patient.  Visitors with respiratory illnesses are discouraged from visiting and should remain at home.  If the patient needs to stay at the hospital during part of their recovery, the visitor guidelines for inpatient rooms apply. Pre-op nurse will coordinate an appropriate time for 1 support person to accompany patient in pre-op.  This support person may not rotate.    Please refer to the [facility] website for the visitor guidelines for Inpatients (after your surgery is over and you are in a regular room).       Your procedure is scheduled on:  01/08/2024    Report to [facility] Main Entrance    Report to admitting at  0800 AM   Call this number if you have problems the morning of surgery [phone]   Do not eat food  or drink liquids :After Midnight.                If you have questions, please contact your surgeon's office.      Oral Hygiene is also important to reduce your risk of infection.                                    Remember - BRUSH YOUR TEETH THE MORNING OF SURGERY WITH YOUR REGULAR TOOTHPASTE  DENTURES WILL BE REMOVED PRIOR TO SURGERY PLEASE DO NOT APPLY Poly grip OR ADHESIVES!!!   Do NOT smoke after Midnight   Stop all vitamins and herbal supplements 7 days before surgery.   Take these medicines the morning of surgery with A SIP OF WATER:  inhalers as usual and bring, nasal spray , zyrtec , flomax , nexium , celexa    DO NOT TAKE ANY ORAL DIABETIC MEDICATIONS DAY OF YOUR SURGERY  Bring CPAP mask and tubing day of surgery.                              You may not have any metal on your body including hair pins, jewelry, and body piercing             Do not wear make-up, lotions, powders, perfumes/cologne, or  deodorant  Do not wear nail polish including gel and S&S, artificial/acrylic nails, or any other type of covering on natural nails including finger and toenails. If you have artificial nails, gel coating, etc. that needs to be removed by a nail salon please have this removed prior to surgery or surgery may need to be canceled/ delayed if the surgeon/ anesthesia feels like they are unable to be safely monitored.   Do not shave  48 hours prior to surgery.               Men may shave face and neck.   Do not bring valuables to the hospital. [facility] IS NOT             RESPONSIBLE   FOR VALUABLES.   Contacts, glasses, dentures or bridgework may not be worn into surgery.   Bring small overnight bag day of surgery.   DO NOT BRING YOUR HOME MEDICATIONS TO THE HOSPITAL. PHARMACY WILL DISPENSE MEDICATIONS  LISTED ON YOUR MEDICATION LIST TO YOU DURING YOUR ADMISSION IN THE HOSPITAL!    Patients discharged on the day of surgery will not be allowed to drive home.  Someone NEEDS to stay with you for the first 24 hours after anesthesia.   Special Instructions: Bring a copy of your healthcare power of attorney and living will documents the day of surgery if you haven't scanned them before.              Please read over the following fact sheets you were given: IF YOU HAVE QUESTIONS ABOUT YOUR PRE-OP INSTRUCTIONS PLEASE CALL [phone].   If you received a COVID test during your pre-op visit  it is requested that you wear a mask when out in public, stay away from anyone that may not be feeling well and notify your surgeon if you develop symptoms. If you test positive for Covid or have been in contact with anyone that has tested positive in the last 10 days please notify you surgeon.    [facility] - Preparing for Surgery Before surgery, you can play an important role.  Because skin is not sterile, your skin needs to be as free of germs as possible.  You can reduce the number of germs on your skin by  washing with CHG (chlorahexidine gluconate) soap before surgery.  CHG is an antiseptic cleaner which kills germs and bonds with the skin to continue killing germs even after washing. Please DO NOT use if you have an allergy to CHG or antibacterial soaps.  If your skin becomes reddened/irritated stop using the CHG and inform your nurse when you arrive at Short Stay. Do not shave (including legs and underarms) for at least 48 hours prior to the first CHG shower.  You may shave your face/neck. Please follow these instructions carefully:  1.  Shower with CHG Soap the night before surgery and the  morning of Surgery.  2.  If you choose to wash your hair, wash your hair first as usual with your  normal  shampoo.  3.  After you shampoo, rinse your hair and body thoroughly to remove the  shampoo.                           4.  Use CHG as you would any other liquid soap.  You can apply chg directly  to the skin and wash                       Gently with a scrungie or clean washcloth.  5.  Apply the CHG Soap to your body ONLY FROM THE NECK DOWN.   Do not use on face/ open                           Wound or open sores. Avoid contact with eyes, ears mouth and genitals (private parts).                       Wash face,  Genitals (private parts) with your normal soap.             6.  Wash thoroughly, paying special attention to the area where your surgery  will be performed.  7.  Thoroughly rinse your body with warm water from the neck down.  8.  DO NOT shower/wash with your normal soap after using and rinsing off  the CHG Soap.                9.  Pat yourself dry with a clean towel.            10.  Wear clean pajamas.            11.  Place clean sheets on your bed the night of your first shower and do not  sleep with pets. Day of Surgery : Do not apply any lotions/deodorants the morning of surgery.  Please wear clean clothes to the hospital/surgery center.  FAILURE TO FOLLOW THESE INSTRUCTIONS MAY RESULT IN THE  CANCELLATION OF YOUR SURGERY PATIENT SIGNATURE_________________________________  NURSE SIGNATURE__________________________________  ________________________________________________________________________

## 2024-01-06 NOTE — H&P (Signed)
 01/05/24: Taylor Harvey is a 73 yo male who was seen December 28, 2023 he was seen by outside physician. He was having left flank pain. He had microscopic hematuria. He was given an antibiotic and a CT scan was ordered. He had a 15 mm x 9 mm distal left ureteral stone approximate 3 cm proximal to the left ureterovesical junction. He had a 3 mm stone left kidney nonobstructing. He had E. coli in the urine 10,000-49,000 colonies. He has had no further pain since Thursday 12/24/23. HE has had some lower abdominal pressure. He is currently on Cipro  and is also taking the TMP nightly. He is out of the tamsulosin . He has had prior stones with 1 prior ESWL. His UA is clear today.      ALLERGIES: No Allergies    MEDICATIONS: Cipro  250 MG Tablet 1 tablet PO BID  Simvastatin  10 MG Tablet  Tamsulosin  HCl 0.4 MG Capsule  Trimethoprim  100 MG Tablet 1 tablet PO Daily  Albuterol  Sulfate  Aspirin 81  Cetirizine  HCl  Citalopram  Hydrobromide 40 MG Tablet  Esomeprazole  Magnesium   Telmisartan  80 MG Tablet  traZODone  HCl 50 MG Tablet     GU PSH: ESWL       PSH Notes: Umbilical hernia repair.    NON-GU PSH: None   GU PMH: Nocturia - 12/30/2023 Ureteral calculus - 12/30/2023 Urinary Frequency - 12/30/2023 Urinary Tract Inf, Unspec site - 12/30/2023    NON-GU PMH: Anxiety Arthritis Diabetes Type 2 GERD Hypercholesterolemia Hypertension    FAMILY HISTORY: 2 daughters - Daughter 3 Son's - Son Diabetes - Runs in Family heart - Mother, Father   SOCIAL HISTORY: Marital Status: Widowed Preferred Language: English; Ethnicity: Not Hispanic Or Latino; Race: White Current Smoking Status: Patient has never smoked.   Tobacco Use Assessment Completed: Used Tobacco in last 30 days? Has never drank.  Drinks 2 caffeinated drinks per day. Patient's occupation is/was Retired.    REVIEW OF SYSTEMS:    GU Review Male:   Patient reports get up at night to urinate and leakage of urine. Patient denies frequent urination,  hard to postpone urination, burning/ pain with urination, stream starts and stops, trouble starting your stream, have to strain to urinate , erection problems, and penile pain.  Gastrointestinal (Upper):   Patient denies nausea, vomiting, and indigestion/ heartburn.  Gastrointestinal (Lower):   Patient denies diarrhea and constipation.  Constitutional:   Patient denies fever, night sweats, weight loss, and fatigue.  Skin:   Patient denies skin rash/ lesion and itching.  Eyes:   Patient denies blurred vision and double vision.  Ears/ Nose/ Throat:   Patient denies sore throat and sinus problems.  Hematologic/Lymphatic:   Patient denies easy bruising and swollen glands.  Cardiovascular:   Patient denies leg swelling and chest pains.  Respiratory:   Patient denies cough and shortness of breath.  Endocrine:   Patient denies excessive thirst.  Musculoskeletal:   Patient denies back pain and joint pain.  Neurological:   Patient denies headaches and dizziness.  Psychologic:   Patient denies depression and anxiety.   VITAL SIGNS: None   MULTI-SYSTEM PHYSICAL EXAMINATION:    Constitutional: Well-nourished. No physical deformities. Normally developed. Good grooming.  Respiratory: Normal breath sounds. No labored breathing, no use of accessory muscles.   Cardiovascular: Regular rate and rhythm. No murmur, no gallop.   Skin: No paleness, no jaundice, no cyanosis. No lesion, no ulcer, no rash.  Neurologic / Psychiatric: Oriented to time, oriented to place, oriented to person.  No depression, no anxiety, no agitation.  Musculoskeletal: Normal gait and station of head and neck.     Complexity of Data:  Records Review:   AUA Symptom Score, Previous Patient Records  Urine Test Review:   Urinalysis  X-Ray Review: [facility] Protocol: Reviewed Films. Reviewed Report. Discussed With Patient.     PROCEDURES:          Visit Complexity - G2211 Chronic management         Urinalysis Dipstick Dipstick  Cont'd  Color: Yellow Bilirubin: Neg mg/dL  Appearance: Clear Ketones: Neg mg/dL  Specific Gravity: 8.974 Blood: Neg ery/uL  pH: <=5.0 Protein: Neg mg/dL  Glucose: Neg mg/dL Urobilinogen: 0.2 mg/dL    Nitrites: Neg    Leukocyte Esterase: Neg leu/uL    ASSESSMENT:      ICD-10 Details  1 GU:   Ureteral calculus - N20.1 Acute, Threat to Bodily Function - He has a 15x43mm left distal stone without obstruction. I reviewed options and will get him set up for ureteroscopy.   I have reviewed the risks of ureteroscopy including bleeding, infection, ureteral injury, need for a stent or secondary procedures, thrombotic events and anesthetic complications.    2   Acute Cystitis/UTI - N30.00 Acute, Uncomplicated, Improving - His UA is clear on current therapy. He will stay on TMP.    PLAN:           Schedule Return Visit/Planned Activity: ASAP - Schedule Surgery  Procedure: Unspecified Date - Cysto Uretero Lithotripsy - [phone], left          Document Letter(s):  Created for Patient: Clinical Summary         Notes:   CC: Dr. Comer Greet.

## 2024-01-06 NOTE — Progress Notes (Addendum)
 Anesthesia Review:  PCP: Comer Greet- LOV 12/28/23  Cardiologist : none   PPM/ ICD: Device Orders: Rep Notified:  Chest x-ray : EKG : 01/07/24  Echo : Stress test: Cardiac Cath :   Activity level: can do a flight of stairs without difficulty  Sleep Study/ CPAP : has sleep apnea does not use cpap  Fasting Blood Sugar :      / Checks Blood Sugar -- times a day:     DM- does not check glucose , type 2 on no meds  Hgba1c- 01/07/24-   Blood Thinner/ Instructions Cherre Dose: ASA / Instructions/ Last Dose :    CBC-12/28/23 - in epic   BMP done with creatinine of 1.66 on 01/07/24 routed to DR Watt on 01/07/24.

## 2024-01-07 ENCOUNTER — Encounter (HOSPITAL_COMMUNITY)
Admission: RE | Admit: 2024-01-07 | Discharge: 2024-01-07 | Disposition: A | Discharge status: Home / Self Care | Source: Ambulatory Visit | Attending: Urology | Admitting: Urology

## 2024-01-07 ENCOUNTER — Encounter (HOSPITAL_COMMUNITY): Payer: Self-pay

## 2024-01-07 ENCOUNTER — Other Ambulatory Visit: Payer: Self-pay

## 2024-01-07 VITALS — BP 119/63 | HR 58 | Temp 98.3°F | Resp 16 | Ht 67.0 in | Wt 234.0 lb

## 2024-01-07 DIAGNOSIS — R001 Bradycardia, unspecified: Secondary | ICD-10-CM | POA: Diagnosis not present

## 2024-01-07 DIAGNOSIS — N202 Calculus of kidney with calculus of ureter: Secondary | ICD-10-CM | POA: Insufficient documentation

## 2024-01-07 DIAGNOSIS — I700 Atherosclerosis of aorta: Secondary | ICD-10-CM | POA: Diagnosis not present

## 2024-01-07 DIAGNOSIS — K219 Gastro-esophageal reflux disease without esophagitis: Secondary | ICD-10-CM | POA: Insufficient documentation

## 2024-01-07 DIAGNOSIS — I10 Essential (primary) hypertension: Secondary | ICD-10-CM | POA: Diagnosis not present

## 2024-01-07 DIAGNOSIS — G4733 Obstructive sleep apnea (adult) (pediatric): Secondary | ICD-10-CM | POA: Diagnosis not present

## 2024-01-07 DIAGNOSIS — E119 Type 2 diabetes mellitus without complications: Secondary | ICD-10-CM | POA: Insufficient documentation

## 2024-01-07 DIAGNOSIS — Z01818 Encounter for other preprocedural examination: Secondary | ICD-10-CM | POA: Insufficient documentation

## 2024-01-07 DIAGNOSIS — E669 Obesity, unspecified: Secondary | ICD-10-CM | POA: Diagnosis not present

## 2024-01-07 HISTORY — DX: Type 2 diabetes mellitus without complications: E11.9

## 2024-01-07 HISTORY — DX: Personal history of urinary calculi: Z87.442

## 2024-01-07 LAB — BASIC METABOLIC PANEL WITH GFR
Anion gap: 6 (ref 5–15)
BUN: 21 mg/dL (ref 8–23)
CO2: 24 mmol/L (ref 22–32)
Calcium: 9.3 mg/dL (ref 8.9–10.3)
Chloride: 108 mmol/L (ref 98–111)
Creatinine, Ser: 1.66 mg/dL — ABNORMAL HIGH (ref 0.61–1.24)
GFR, Estimated: 43 mL/min — ABNORMAL LOW (ref >60)
Glucose, Bld: 110 mg/dL — ABNORMAL HIGH (ref 70–99)
Potassium: 5.1 mmol/L (ref 3.5–5.1)
Sodium: 138 mmol/L (ref 135–145)

## 2024-01-07 LAB — HEMOGLOBIN A1C
Hgb A1c MFr Bld: 6.5 % — ABNORMAL HIGH (ref 4.8–5.6)
Mean Plasma Glucose: 140 mg/dL

## 2024-01-07 LAB — GLUCOSE, CAPILLARY: Glucose-Capillary: 107 mg/dL — ABNORMAL HIGH (ref 70–99)

## 2024-01-07 NOTE — Anesthesia Preprocedure Evaluation (Addendum)
 Anesthesia Evaluation  Patient identified by MRN, date of birth, ID band Patient awake    Reviewed: Allergy & Precautions, H&P , NPO status , Patient's Chart, lab work & pertinent test results  History of Anesthesia Complications Negative for: history of anesthetic complications  Airway Mallampati: IV  TM Distance: >3 FB Neck ROM: Full    Dental  (+) Dental Advisory Given, Missing, Poor Dentition, Chipped   Pulmonary shortness of breath, sleep apnea (does not use his CPAP) , neg recent URI   Pulmonary exam normal breath sounds clear to auscultation       Cardiovascular hypertension, Pt. on medications (-) angina (-) DOE  Rhythm:Regular Rate:Normal     Neuro/Psych neg Headaches PSYCHIATRIC DISORDERS Anxiety Depression    negative neurological ROS     GI/Hepatic Neg liver ROS,GERD  Controlled and Medicated,,  Endo/Other  diabetes  Class 3 obesity  Renal/GU negative Renal ROS     Musculoskeletal  (+) Arthritis ,    Abdominal  (+) + obese  Peds  Hematology negative hematology ROS (+)   Anesthesia Other Findings   Reproductive/Obstetrics                              Anesthesia Physical Anesthesia Plan  ASA: III  Anesthesia Plan: General   Post-op Pain Management:    Induction: Intravenous  PONV Risk Score and Plan: 3 and Ondansetron , Dexamethasone  and Treatment may vary due to age or medical condition  Airway Management Planned: LMA  Additional Equipment:   Intra-op Plan:   Post-operative Plan: Extubation in OR  Informed Consent: I have reviewed the patients History and Physical, chart, labs and discussed the procedure including the risks, benefits and alternatives for the proposed anesthesia with the patient or authorized representative who has indicated his/her understanding and acceptance.     Dental advisory given  Plan Discussed with: CRNA and Surgeon  Anesthesia  Plan Comments: (See PAT note from 8/14)         Anesthesia Quick Evaluation

## 2024-01-07 NOTE — Progress Notes (Addendum)
 Case: [id number] Date/Time: 01/08/24 0945   Procedure: CYSTOSCOPY/URETEROSCOPY/HOLMIUM LASER/STENT PLACEMENT (Left)   Anesthesia type: General   Diagnosis: Calculus of ureter [N20.1]   Pre-op diagnosis: LEFT URETERAL STONE   Location: [facility] ROOM 03 / [facility]   Surgeons: Taylor Rush, MD       DISCUSSION: Taylor Harvey is a 73 yo male with PMH of HTN, aortic atherosclerosis, moderate OSA (no CPAP use), GERD, T2DM (A1c 6.7 in April 2025), anemia, anxiety, depression, arthritis, kidney stones, obesity.  Seen by PCP on 09/17/23 for annual exam. BP and DM controlled. Advised f/u in 6 months. Seen again on 12/28/23 for flank pain and CT renal showed large L ureteral stone. Now scheduled for surgery above.  PAT labs notable for SCr of 1.66 up from baseline of 1.1. Possibly related to kidney stone although it was documented as non-obstructive. Pt also taking Trimethoprim . Called patient and advised him to drink a lot of water today and will recheck labs tomorrow. Left message with Dr. Maynard scheduler  VS: BP 119/63   Pulse (!) 58   Temp 36.8 C (Oral)   Resp 16   Ht 5' 7 (1.702 m)   Wt 106.1 kg   SpO2 97%   BMI 36.65 kg/m   PROVIDERS: Taylor Comer BRAVO, MD   LABS: Labs reviewed: Repeat I stat chem 8.  (all labs ordered are listed, but only abnormal results are displayed)  Labs Reviewed  BASIC METABOLIC PANEL WITH GFR - Abnormal; Notable for the following components:      Result Value   Glucose, Bld 110 (*)    Creatinine, Ser 1.66 (*)    GFR, Estimated 43 (*)    All other components within normal limits  GLUCOSE, CAPILLARY - Abnormal; Notable for the following components:   Glucose-Capillary 107 (*)    All other components within normal limits  HEMOGLOBIN A1C     IMAGES: CT Renal 12/29/23:  IMPRESSION: 1. 15 x 9 mm calculus in the distal left ureter, approximately 3.3 cm proximal to the left ureterovesicular junction, with mild upstream ureteral dilation and prominence of  the left renal collecting system without frank hydronephrosis. 2. Nonobstructive 3 mm calculus in the interpolar left kidney. 3. Circumferential bladder wall thickening may be due to underdistention or cystitis. Recommend correlation with urinalysis. 4. Colonic diverticulosis most pronounced in the sigmoid colon without evidence of acute diverticulitis. 5.  Aortic Atherosclerosis (ICD10-I70.0).   EKG 01/07/24:  Sinus bradycardia Otherwise normal ECG  CV:  Past Medical History:  Diagnosis Date   Allergy    Arthritis    Diabetes mellitus without complication (HCC)    GERD (gastroesophageal reflux disease)    History of kidney stones    Hyperlipemia    Hypertension    Sleep apnea 2005   moderate-has cpap-not used in 3 yr    Past Surgical History:  Procedure Laterality Date   COLONOSCOPY     INSERTION OF MESH  05/05/2012   Procedure: INSERTION OF MESH;  Surgeon: Taylor Bury, MD;  Location: [facility];  Service: General;  Laterality: N/A;   LITHOTRIPSY     x2   SHOULDER ARTHROSCOPY  07/2017   trigger finger surgery  2006   UMBILICAL HERNIA REPAIR  05/05/2012   Procedure: HERNIA REPAIR UMBILICAL ADULT;  Surgeon: Taylor Bury, MD;  Location: [facility];  Service: General;  Laterality: N/A;    MEDICATIONS:  albuterol  (VENTOLIN  HFA) 108 (90 Base) MCG/ACT inhaler   ALPRAZolam  (XANAX ) 0.5  MG tablet   aspirin 81 MG tablet   azelastine  (ASTELIN ) 0.1 % nasal spray   cephALEXin  (KEFLEX ) 500 MG capsule   cetirizine  (ZYRTEC ) 10 MG tablet   ciprofloxacin  (CIPRO ) 250 MG tablet   citalopram  (CELEXA ) 40 MG tablet   esomeprazole  (NEXIUM ) 40 MG capsule   fluticasone -salmeterol (ADVAIR ) 250-50 MCG/ACT AEPB   HYDROcodone -acetaminophen  (NORCO/VICODIN) 5-325 MG tablet   meclizine  (ANTIVERT ) 25 MG tablet   simvastatin  (ZOCOR ) 10 MG tablet   tamsulosin  (FLOMAX ) 0.4 MG CAPS capsule   telmisartan  (MICARDIS ) 80 MG tablet   traZODone  (DESYREL ) 50 MG  tablet   trimethoprim  (TRIMPEX ) 100 MG tablet   No current facility-administered medications for this encounter.    Taylor Harvey/[facility] Short Stay/Anesthesiology [facility] Phone [phone] 01/07/2024 12:58 PM

## 2024-01-08 ENCOUNTER — Ambulatory Visit (HOSPITAL_BASED_OUTPATIENT_CLINIC_OR_DEPARTMENT_OTHER): Admitting: Certified Registered"

## 2024-01-08 ENCOUNTER — Ambulatory Visit (HOSPITAL_COMMUNITY)

## 2024-01-08 ENCOUNTER — Encounter (HOSPITAL_COMMUNITY): Payer: Self-pay | Admitting: Urology

## 2024-01-08 ENCOUNTER — Other Ambulatory Visit: Payer: Self-pay

## 2024-01-08 ENCOUNTER — Encounter (HOSPITAL_COMMUNITY)
Admission: RE | Disposition: A | Discharge status: Home / Self Care | Payer: Self-pay | Source: Home / Self Care | Attending: Urology

## 2024-01-08 ENCOUNTER — Ambulatory Visit (HOSPITAL_COMMUNITY)
Admission: RE | Admit: 2024-01-08 | Discharge: 2024-01-08 | Disposition: A | Discharge status: Home / Self Care | Attending: Urology | Admitting: Urology

## 2024-01-08 ENCOUNTER — Ambulatory Visit (HOSPITAL_COMMUNITY): Admitting: Medical

## 2024-01-08 DIAGNOSIS — M1990 Unspecified osteoarthritis, unspecified site: Secondary | ICD-10-CM | POA: Insufficient documentation

## 2024-01-08 DIAGNOSIS — F418 Other specified anxiety disorders: Secondary | ICD-10-CM | POA: Insufficient documentation

## 2024-01-08 DIAGNOSIS — I10 Essential (primary) hypertension: Secondary | ICD-10-CM | POA: Diagnosis not present

## 2024-01-08 DIAGNOSIS — N3000 Acute cystitis without hematuria: Secondary | ICD-10-CM | POA: Insufficient documentation

## 2024-01-08 DIAGNOSIS — E119 Type 2 diabetes mellitus without complications: Secondary | ICD-10-CM | POA: Diagnosis not present

## 2024-01-08 DIAGNOSIS — K219 Gastro-esophageal reflux disease without esophagitis: Secondary | ICD-10-CM | POA: Diagnosis not present

## 2024-01-08 DIAGNOSIS — N201 Calculus of ureter: Secondary | ICD-10-CM | POA: Diagnosis not present

## 2024-01-08 DIAGNOSIS — Z6836 Body mass index (BMI) 36.0-36.9, adult: Secondary | ICD-10-CM | POA: Diagnosis not present

## 2024-01-08 DIAGNOSIS — N35912 Unspecified bulbous urethral stricture, male: Secondary | ICD-10-CM | POA: Diagnosis not present

## 2024-01-08 DIAGNOSIS — E66813 Obesity, class 3: Secondary | ICD-10-CM | POA: Insufficient documentation

## 2024-01-08 DIAGNOSIS — N200 Calculus of kidney: Secondary | ICD-10-CM

## 2024-01-08 DIAGNOSIS — G4730 Sleep apnea, unspecified: Secondary | ICD-10-CM | POA: Insufficient documentation

## 2024-01-08 DIAGNOSIS — Z79899 Other long term (current) drug therapy: Secondary | ICD-10-CM | POA: Insufficient documentation

## 2024-01-08 DIAGNOSIS — N179 Acute kidney failure, unspecified: Secondary | ICD-10-CM

## 2024-01-08 DIAGNOSIS — Z01818 Encounter for other preprocedural examination: Secondary | ICD-10-CM

## 2024-01-08 DIAGNOSIS — Z833 Family history of diabetes mellitus: Secondary | ICD-10-CM | POA: Insufficient documentation

## 2024-01-08 HISTORY — PX: CYSTOSCOPY/URETEROSCOPY/HOLMIUM LASER/STENT PLACEMENT: SHX6546

## 2024-01-08 LAB — POCT I-STAT, CHEM 8
BUN: 31 mg/dL — ABNORMAL HIGH (ref 8–23)
Calcium, Ion: 1.16 mmol/L (ref 1.15–1.40)
Chloride: 106 mmol/L (ref 98–111)
Creatinine, Ser: 1.8 mg/dL — ABNORMAL HIGH (ref 0.61–1.24)
Glucose, Bld: 110 mg/dL — ABNORMAL HIGH (ref 70–99)
HCT: 34 % — ABNORMAL LOW (ref 39.0–52.0)
Hemoglobin: 11.6 g/dL — ABNORMAL LOW (ref 13.0–17.0)
Potassium: 4.7 mmol/L (ref 3.5–5.1)
Sodium: 138 mmol/L (ref 135–145)
TCO2: 23 mmol/L (ref 22–32)

## 2024-01-08 LAB — GLUCOSE, CAPILLARY
Glucose-Capillary: 106 mg/dL — ABNORMAL HIGH (ref 70–99)
Glucose-Capillary: 125 mg/dL — ABNORMAL HIGH (ref 70–99)

## 2024-01-08 SURGERY — CYSTOSCOPY/URETEROSCOPY/HOLMIUM LASER/STENT PLACEMENT
Anesthesia: General | Site: Ureter | Laterality: Left

## 2024-01-08 MED ORDER — PHENYLEPHRINE 80 MCG/ML (10ML) SYRINGE FOR IV PUSH (FOR BLOOD PRESSURE SUPPORT)
PREFILLED_SYRINGE | INTRAVENOUS | Status: DC | PRN
  Administered 2024-01-08: 80 ug via INTRAVENOUS
  Administered 2024-01-08 (×2): 40 ug via INTRAVENOUS
  Administered 2024-01-08: 80 ug via INTRAVENOUS
  Administered 2024-01-08 (×2): 40 ug via INTRAVENOUS

## 2024-01-08 MED ORDER — CIPROFLOXACIN IN D5W 400 MG/200ML IV SOLN
400.0000 mg | INTRAVENOUS | Status: AC
  Administered 2024-01-08: 400 mg via INTRAVENOUS
  Filled 2024-01-08: qty 200

## 2024-01-08 MED ORDER — LACTATED RINGERS IV SOLN: INTRAVENOUS | Status: DC

## 2024-01-08 MED ORDER — EPHEDRINE SULFATE-NACL 50-0.9 MG/10ML-% IV SOSY
PREFILLED_SYRINGE | INTRAVENOUS | Status: DC | PRN
  Administered 2024-01-08: 10 mg via INTRAVENOUS
  Administered 2024-01-08: 5 mg via INTRAVENOUS
  Administered 2024-01-08: 10 mg via INTRAVENOUS

## 2024-01-08 MED ORDER — LIDOCAINE HCL (CARDIAC) PF 100 MG/5ML IV SOSY
PREFILLED_SYRINGE | INTRAVENOUS | Status: DC | PRN
Start: 2024-01-08 — End: 2024-01-08
  Administered 2024-01-08: 100 mg via INTRAVENOUS

## 2024-01-08 MED ORDER — PROPOFOL 10 MG/ML IV BOLUS
INTRAVENOUS | Status: DC | PRN
Start: 2024-01-08 — End: 2024-01-08
  Administered 2024-01-08: 150 mg via INTRAVENOUS

## 2024-01-08 MED ORDER — DROPERIDOL 2.5 MG/ML IJ SOLN: 0.6250 mg | Freq: Once | INTRAMUSCULAR | Status: DC | PRN

## 2024-01-08 MED ORDER — CHLORHEXIDINE GLUCONATE 0.12 % MT SOLN
15.0000 mL | Freq: Once | OROMUCOSAL | Status: AC
  Administered 2024-01-08: 15 mL via OROMUCOSAL

## 2024-01-08 MED ORDER — OXYCODONE HCL 5 MG PO TABS: 5.0000 mg | ORAL_TABLET | Freq: Once | ORAL | Status: DC | PRN

## 2024-01-08 MED ORDER — FENTANYL CITRATE (PF) 100 MCG/2ML IJ SOLN
INTRAMUSCULAR | Status: AC
  Filled 2024-01-08: qty 2

## 2024-01-08 MED ORDER — ACETAMINOPHEN 500 MG PO TABS
1000.0000 mg | ORAL_TABLET | Freq: Once | ORAL | Status: AC
  Administered 2024-01-08: 1000 mg via ORAL
  Filled 2024-01-08: qty 2

## 2024-01-08 MED ORDER — ORAL CARE MOUTH RINSE: 15.0000 mL | Freq: Once | OROMUCOSAL | Status: AC

## 2024-01-08 MED ORDER — ONDANSETRON HCL 4 MG/2ML IJ SOLN
INTRAMUSCULAR | Status: DC | PRN
  Administered 2024-01-08: 4 mg via INTRAVENOUS

## 2024-01-08 MED ORDER — FENTANYL CITRATE (PF) 100 MCG/2ML IJ SOLN
INTRAMUSCULAR | Status: DC | PRN
  Administered 2024-01-08 (×2): 50 ug via INTRAVENOUS

## 2024-01-08 MED ORDER — OXYCODONE HCL 5 MG/5ML PO SOLN: 5.0000 mg | Freq: Once | ORAL | Status: DC | PRN

## 2024-01-08 MED ORDER — HYDROCODONE-ACETAMINOPHEN 5-325 MG PO TABS: 1.0000 | ORAL_TABLET | Freq: Four times a day (QID) | ORAL | 0 refills | Status: AC | PRN

## 2024-01-08 MED ORDER — PHENYLEPHRINE HCL-NACL 20-0.9 MG/250ML-% IV SOLN
INTRAVENOUS | Status: DC | PRN
  Administered 2024-01-08: 40 ug/min via INTRAVENOUS

## 2024-01-08 MED ORDER — FENTANYL CITRATE PF 50 MCG/ML IJ SOSY: 25.0000 ug | PREFILLED_SYRINGE | INTRAMUSCULAR | Status: DC | PRN

## 2024-01-08 MED ORDER — SODIUM CHLORIDE 0.9 % IR SOLN
Status: DC | PRN
  Administered 2024-01-08: 3000 mL

## 2024-01-08 MED ORDER — DEXAMETHASONE SODIUM PHOSPHATE 10 MG/ML IJ SOLN
INTRAMUSCULAR | Status: DC | PRN
  Administered 2024-01-08: 4 mg via INTRAVENOUS

## 2024-01-08 MED ORDER — PROPOFOL 10 MG/ML IV BOLUS
INTRAVENOUS | Status: AC
  Filled 2024-01-08: qty 20

## 2024-01-08 MED ORDER — IOHEXOL 300 MG/ML  SOLN
INTRAMUSCULAR | Status: DC | PRN
  Administered 2024-01-08: 4 mL

## 2024-01-08 MED ORDER — SODIUM CHLORIDE 0.9% FLUSH: 3.0000 mL | Freq: Two times a day (BID) | INTRAVENOUS | Status: DC

## 2024-01-08 SURGICAL SUPPLY — 19 items
BAG URO CATCHER STRL LF (MISCELLANEOUS) ×2 IMPLANT
BASKET STONE NCOMPASS (UROLOGICAL SUPPLIES) IMPLANT
CATH URETERAL DUAL LUMEN 10F (MISCELLANEOUS) IMPLANT
CATH URETL OPEN 5X70 (CATHETERS) IMPLANT
CLOTH BEACON ORANGE TIMEOUT ST (SAFETY) ×2 IMPLANT
EXTRACTOR STONE NITINOL NGAGE (UROLOGICAL SUPPLIES) IMPLANT
GLOVE SURG SS PI 8.0 STRL IVOR (GLOVE) ×2 IMPLANT
GOWN STRL SURGICAL XL XLNG (GOWN DISPOSABLE) ×2 IMPLANT
GUIDEWIRE STR DUAL SENSOR (WIRE) ×2 IMPLANT
KIT TURNOVER KIT A (KITS) ×2 IMPLANT
LASER FIB FLEXIVA PULSE ID 365 (Laser) ×2 IMPLANT
MANIFOLD NEPTUNE II (INSTRUMENTS) ×2 IMPLANT
PACK CYSTO (CUSTOM PROCEDURE TRAY) ×2 IMPLANT
SHEATH NAVIGATOR HD 11/13X28 (SHEATH) IMPLANT
SHEATH NAVIGATOR HD 11/13X36 (SHEATH) IMPLANT
STENT URET 6FRX24 CONTOUR (STENTS) IMPLANT
TRACTIP FLEXIVA PULS ID 200XHI (Laser) IMPLANT
TUBING CONNECTING 10 (TUBING) ×2 IMPLANT
TUBING UROLOGY SET (TUBING) ×2 IMPLANT

## 2024-01-08 NOTE — Anesthesia Procedure Notes (Addendum)
 Procedure Name: LMA Insertion Date/Time: 01/08/2024 9:54 AM  Performed by: Metta Andrea NOVAK, CRNAPre-anesthesia Checklist: Patient identified, Emergency Drugs available, Suction available, Patient being monitored and Timeout performed Patient Re-evaluated:Patient Re-evaluated prior to induction Oxygen Delivery Method: Circle system utilized Preoxygenation: Pre-oxygenation with 100% oxygen Induction Type: IV induction Ventilation: Mask ventilation without difficulty LMA: LMA inserted LMA Size: 4.0 Number of attempts: 1 Placement Confirmation: positive ETCO2 Tube secured with: Tape Dental Injury: Teeth and Oropharynx as per pre-operative assessment

## 2024-01-08 NOTE — Interval H&P Note (Signed)
 History and Physical Interval Note: No change.   01/08/2024 9:25 AM  Taylor Harvey.  has presented today for surgery, with the diagnosis of LEFT URETERAL STONE.  The various methods of treatment have been discussed with the patient and family. After consideration of risks, benefits and other options for treatment, the patient has consented to  Procedure(s): CYSTOSCOPY/URETEROSCOPY/HOLMIUM LASER/STENT PLACEMENT (Left) as a surgical intervention.  The patient's history has been reviewed, patient examined, no change in status, stable for surgery.  I have reviewed the patient's chart and labs.  Questions were answered to the patient's satisfaction.     Katara Griner

## 2024-01-08 NOTE — Discharge Instructions (Addendum)
 We will need to remove the stent in the office at your follow by using a cystoscopy to look in and get it.  I will order an X-ray to make sure all significant stones are removed.   Please bring the stones to the office.

## 2024-01-08 NOTE — Op Note (Signed)
 Procedure: 1.  Cystoscopy with left retrograde pyelogram and interpretation. 2.  Left ureteroscopy with holmium laser application, stone extraction and insertion of left double-J stent. 3.  Application of fluoroscopy.  Preop diagnosis: 9 x 15 mm left distal ureteral stone.  Postop diagnosis: Same.  Surgeon: Dr. Norleen Seltzer.  Anesthesia: General.  Specimen: Stone fragments.  Drains: 6 French by 24 cm left Contour double-J stent without tether.  EBL: None.  Complications: None.  Indications: The patient is a 73 year old male with a history of urolithiasis who was seen in our office recently for a recent history of left flank pain and a low colony count possible UTI.  He was found on CT scan to have a 9 x 15 mm left distal ureteral stone and was felt due to the location and size that ureteroscopy was the appropriate treatment.  Procedure: He was taken the operating room where general anesthetic was induced.  He was given Cipro  which he been on preoperatively as well.  He was placed in lithotomy position and fitted with PAS hose.  He was prepped with Betadine solution and draped in usual sterile fashion.  Cystoscopy was performed using the 21 [place] scope and 30 degree lens.  Examination revealed very mild bulbar urethral stricture disease that did not impede scope passage and a mild membranous urethral stricture.  Examination of the prostate demonstrated bilobar hyperplasia with a high stiff bladder neck that was difficult to get over.  Examination of bladder demonstrated moderate trabeculation without mucosal lesions.  Ureteral orifices were unremarkable.  After cystoscopy a 5 French open-ended catheter was used to perform a left retrograde pyelogram with Omnipaque .  The left retrograde pyelogram demonstrated a normal caliber ureter up to his stone which was just over the lower sacroiliac joint with some proximal dilation.  A sensor wire was then advanced to the kidney by the stone without  difficulty.  Because of the stiff bladder neck I did not feel I could readily use a semirigid ureteroscope, so I used the 25 cm 11/13 [place] digital access sheath and initially passed the inner core without difficulty and then the assembled sheath also without difficulty to the level of the stone.  The inner core was removed and a second wire was placed to the kidney as a safety wire.  The sheath was removed and reassembled and reinserted over the working wire alongside the safety wire.  The inner core and working wire were then removed.  The dual-lumen digital flexible scope was then passed through the sheath to the level of the stone which was readily visualized.  The stone was then fragmented using the 242 m tract tip laser.  And fragments were removed using the engage basket at 1 point the ureteroscope got trapped in the sheath because of stone fragments but I was able to remove the sheath and scope easily without any ureteral injury and once out of the patient I was able to release the ureteroscope from the sheath.  I then was able to go back and with the ureteroscope alone without the sheath and do additional fragmentation and fragment removal.  1 point I did attempt to use the short semirigid dual-lumen ureteroscope but was unable to negotiated into the ureter due to the high bladder neck and very stiff, fixed prostatic urethra.  I then passed the flexible scope again and did some additional lasering and fragment removal.  Once the stone had been reduced to fragments there were either removed or small enough it was  felt they would pass I removed the ureteroscope.  Toward the end the visualization and scope passage was a little bit more difficult and it is possible there could be a 3 to 4 mm fragment remaining but I felt with time with a stent that is likely to be able to pass through the caliber the ureter.  The cystoscope was then reinserted over the wire and a 6 [place] by 24 cm contour double-J stent  without tether was passed to the kidney under fluoroscopic guidance.  The wire was removed, leaving a good coil in the kidney and a good coil in the bladder.  The bladder was drained and the cystoscope was removed.  The stone fragments were collected and will be given to the family to bring to the office for evaluation.  He was taken down from lithotomy position, his anesthetic was reversed and he was moved to recovery in stable condition there were no complications.

## 2024-01-08 NOTE — Transfer of Care (Signed)
 Immediate Anesthesia Transfer of Care Note  Patient: Taylor Harvey.  Procedure(s) Performed: CYSTOSCOPY/URETEROSCOPY/HOLMIUM LASER/STENT PLACEMENT (Left: Ureter)  Patient Location: PACU  Anesthesia Type:General  Level of Consciousness: awake, drowsy, and patient cooperative  Airway & Oxygen Therapy: Patient Spontanous Breathing and Patient connected to face mask oxygen  Post-op Assessment: Report given to RN and Post -op Vital signs reviewed and stable  Post vital signs: Reviewed and stable  Last Vitals:  Vitals Value Taken Time  BP 162/67 01/08/24 11:41  Temp    Pulse 74 01/08/24 11:43  Resp 18 01/08/24 11:43  SpO2 100 % 01/08/24 11:43  Vitals shown include unfiled device data.  Last Pain:  Vitals:   01/08/24 0828  TempSrc: Oral         Complications: No notable events documented.

## 2024-01-11 ENCOUNTER — Encounter (HOSPITAL_COMMUNITY): Payer: Self-pay | Admitting: Urology

## 2024-01-11 NOTE — Anesthesia Postprocedure Evaluation (Signed)
 Anesthesia Post Note  Patient: Taylor Harvey.  Procedure(s) Performed: CYSTOSCOPY/URETEROSCOPY/HOLMIUM LASER/STENT PLACEMENT (Left: Ureter)     Patient location during evaluation: PACU Anesthesia Type: General Level of consciousness: sedated and patient cooperative Pain management: pain level controlled Vital Signs Assessment: post-procedure vital signs reviewed and stable Respiratory status: spontaneous breathing Cardiovascular status: stable Anesthetic complications: no   No notable events documented.  Last Vitals:  Vitals:   01/08/24 1211 01/08/24 1218  BP: (!) 146/72 (!) 154/68  Pulse: 67 75  Resp: 13 18  Temp: 36.4 C 36.5 C  SpO2: 99% 93%    Last Pain:  Vitals:   01/08/24 1218  TempSrc: Oral  PainSc: 0-No pain                 Norleen Pope

## 2024-01-18 DIAGNOSIS — N201 Calculus of ureter: Secondary | ICD-10-CM | POA: Diagnosis not present

## 2024-01-25 ENCOUNTER — Other Ambulatory Visit: Payer: Self-pay | Admitting: Family Medicine

## 2024-02-05 ENCOUNTER — Other Ambulatory Visit: Payer: Self-pay | Admitting: Student in an Organized Health Care Education/Training Program

## 2024-02-05 ENCOUNTER — Other Ambulatory Visit: Payer: Self-pay

## 2024-02-05 DIAGNOSIS — R399 Unspecified symptoms and signs involving the genitourinary system: Secondary | ICD-10-CM

## 2024-02-07 ENCOUNTER — Other Ambulatory Visit: Payer: Self-pay | Admitting: Family Medicine

## 2024-02-15 ENCOUNTER — Other Ambulatory Visit: Payer: Self-pay | Admitting: Family Medicine

## 2024-03-04 ENCOUNTER — Other Ambulatory Visit: Payer: Self-pay | Admitting: Student in an Organized Health Care Education/Training Program

## 2024-03-04 DIAGNOSIS — R399 Unspecified symptoms and signs involving the genitourinary system: Secondary | ICD-10-CM

## 2024-03-13 ENCOUNTER — Other Ambulatory Visit: Payer: Self-pay | Admitting: Family Medicine

## 2024-03-18 ENCOUNTER — Encounter: Payer: Self-pay | Admitting: Family Medicine

## 2024-03-18 ENCOUNTER — Ambulatory Visit (INDEPENDENT_AMBULATORY_CARE_PROVIDER_SITE_OTHER): Admitting: Family Medicine

## 2024-03-18 VITALS — BP 132/70 | HR 74 | Temp 98.0°F | Resp 16 | Ht 67.0 in | Wt 244.2 lb

## 2024-03-18 DIAGNOSIS — E785 Hyperlipidemia, unspecified: Secondary | ICD-10-CM | POA: Diagnosis not present

## 2024-03-18 DIAGNOSIS — I10 Essential (primary) hypertension: Secondary | ICD-10-CM | POA: Diagnosis not present

## 2024-03-18 DIAGNOSIS — E119 Type 2 diabetes mellitus without complications: Secondary | ICD-10-CM | POA: Diagnosis not present

## 2024-03-18 LAB — HEPATIC FUNCTION PANEL
ALT: 16 U/L (ref 0–53)
AST: 13 U/L (ref 0–37)
Albumin: 3.9 g/dL (ref 3.5–5.2)
Alkaline Phosphatase: 86 U/L (ref 39–117)
Bilirubin, Direct: 0.1 mg/dL (ref 0.0–0.3)
Total Bilirubin: 0.6 mg/dL (ref 0.2–1.2)
Total Protein: 6.7 g/dL (ref 6.0–8.3)

## 2024-03-18 LAB — CBC WITH DIFFERENTIAL/PLATELET
Basophils Absolute: 0 K/uL (ref 0.0–0.1)
Basophils Relative: 0.4 % (ref 0.0–3.0)
Eosinophils Absolute: 0.3 K/uL (ref 0.0–0.7)
Eosinophils Relative: 5.1 % — ABNORMAL HIGH (ref 0.0–5.0)
HCT: 40.2 % (ref 39.0–52.0)
Hemoglobin: 12.8 g/dL — ABNORMAL LOW (ref 13.0–17.0)
Lymphocytes Relative: 24.8 % (ref 12.0–46.0)
Lymphs Abs: 1.2 K/uL (ref 0.7–4.0)
MCHC: 32 g/dL (ref 30.0–36.0)
MCV: 90.9 fl (ref 78.0–100.0)
Monocytes Absolute: 0.5 K/uL (ref 0.1–1.0)
Monocytes Relative: 10.5 % (ref 3.0–12.0)
Neutro Abs: 2.9 K/uL (ref 1.4–7.7)
Neutrophils Relative %: 59.2 % (ref 43.0–77.0)
Platelets: 200 K/uL (ref 150.0–400.0)
RBC: 4.42 Mil/uL (ref 4.22–5.81)
RDW: 14.4 % (ref 11.5–15.5)
WBC: 5 K/uL (ref 4.0–10.5)

## 2024-03-18 LAB — LIPID PANEL
Cholesterol: 115 mg/dL (ref 0–200)
HDL: 31.3 mg/dL — ABNORMAL LOW (ref >39.00)
LDL Cholesterol: 62 mg/dL (ref 0–99)
NonHDL: 84.18
Total CHOL/HDL Ratio: 4
Triglycerides: 112 mg/dL (ref 0.0–149.0)
VLDL: 22.4 mg/dL (ref 0.0–40.0)

## 2024-03-18 LAB — BASIC METABOLIC PANEL WITH GFR
BUN: 19 mg/dL (ref 6–23)
CO2: 28 meq/L (ref 19–32)
Calcium: 9.3 mg/dL (ref 8.4–10.5)
Chloride: 105 meq/L (ref 96–112)
Creatinine, Ser: 1.31 mg/dL (ref 0.40–1.50)
GFR: 53.52 mL/min — ABNORMAL LOW (ref >60.00)
Glucose, Bld: 108 mg/dL — ABNORMAL HIGH (ref 70–99)
Potassium: 4.8 meq/L (ref 3.5–5.1)
Sodium: 141 meq/L (ref 135–145)

## 2024-03-18 LAB — TSH: TSH: 1.53 u[IU]/mL (ref 0.35–5.50)

## 2024-03-18 LAB — HEMOGLOBIN A1C: Hgb A1c MFr Bld: 6.7 % — ABNORMAL HIGH (ref 4.6–6.5)

## 2024-03-18 NOTE — Assessment & Plan Note (Signed)
 Chronic problem.  On Simvastatin  10mg  daily w/o difficulty.  Check labs.  Adjust meds prn

## 2024-03-18 NOTE — Progress Notes (Signed)
   Subjective:    Patient ID: Taylor KANDICE Trinidad Mickey., male    DOB: Sep 01, 1950, 73 y.o.   MRN: [id number]  HPI HTN- chronic problem, on Telmisartan  80mg  daily w/ adequate control. No CP, SOB above baseline, HA's, visual changes, edema  Hyperlipidemia- chronic problem, on Simvastatin  10mg  daily.  Denies abd pain, N/V.  DM- chronic problem, diet controlled.  UTD on eye exam, foot exam, microalbumin.  Denies numbness/tingling of hands/feet.   Review of Systems For ROS see HPI     Objective:   Physical Exam Vitals reviewed.  Constitutional:      General: He is not in acute distress.    Appearance: Normal appearance. He is well-developed. He is obese. He is not ill-appearing.  HENT:     Head: Normocephalic and atraumatic.  Eyes:     Extraocular Movements: Extraocular movements intact.     Conjunctiva/sclera: Conjunctivae normal.     Pupils: Pupils are equal, round, and reactive to light.  Neck:     Thyroid : No thyromegaly.  Cardiovascular:     Rate and Rhythm: Normal rate and regular rhythm.     Pulses: Normal pulses.     Heart sounds: Normal heart sounds. No murmur heard. Pulmonary:     Effort: Pulmonary effort is normal. No respiratory distress.     Breath sounds: Normal breath sounds.  Abdominal:     General: Bowel sounds are normal. There is no distension.     Palpations: Abdomen is soft.  Musculoskeletal:     Cervical back: Normal range of motion and neck supple.     Right lower leg: No edema.     Left lower leg: No edema.  Lymphadenopathy:     Cervical: No cervical adenopathy.  Skin:    General: Skin is warm and dry.  Neurological:     General: No focal deficit present.     Mental Status: He is alert and oriented to person, place, and time.     Cranial Nerves: No cranial nerve deficit.  Psychiatric:        Mood and Affect: Mood normal.        Behavior: Behavior normal.           Assessment & Plan:

## 2024-03-18 NOTE — Assessment & Plan Note (Deleted)
 Chronic problem.  Well controlled on Telmisartan  80mg  daily.  Currently asymptomatic.  Check labs due to ARB use but no anticipated med changes.  Will follow.

## 2024-03-18 NOTE — Assessment & Plan Note (Signed)
 Chronic problem.  Well controlled on Telmisartan  80mg  daily.  Currently asymptomatic.  Check labs due to ARB use but no anticipated med changes.  Will follow.

## 2024-03-18 NOTE — Assessment & Plan Note (Signed)
 Chronic problem.  Currently diet controlled.  UTD on eye exam, foot exam, microalbumin.  Currently asymptomatic.  Check labs.  Start meds prn.

## 2024-03-18 NOTE — Patient Instructions (Signed)
Schedule your complete physical in 6 months We'll notify you of your lab results and make any changes if needed Continue to work on healthy diet and regular exercise- you can do it! Call with any questions or concerns Stay Safe!  Stay Healthy! Happy Fall!!! 

## 2024-03-21 ENCOUNTER — Ambulatory Visit: Payer: Self-pay | Admitting: Family Medicine

## 2024-03-23 ENCOUNTER — Ambulatory Visit: Payer: PPO

## 2024-03-23 VITALS — Ht 67.0 in | Wt 244.0 lb

## 2024-03-23 DIAGNOSIS — Z0000 Encounter for general adult medical examination without abnormal findings: Secondary | ICD-10-CM | POA: Diagnosis not present

## 2024-03-23 NOTE — Progress Notes (Signed)
 Subjective:   Taylor Harvey. is a 73 y.o. who presents for a Medicare Wellness preventive visit.  As a reminder, Annual Wellness Visits don't include a physical exam, and some assessments may be limited, especially if this visit is performed virtually. We may recommend an in-person follow-up visit with your provider if needed.  Visit Complete: Virtual I connected with  Taylor Harvey. on 03/23/24 by a audio enabled telemedicine application and verified that I am speaking with the correct person using two identifiers.  Patient Location: Home  Provider Location: Home Office  I discussed the limitations of evaluation and management by telemedicine. The patient expressed understanding and agreed to proceed.  Vital Signs: Because this visit was a virtual/telehealth visit, some criteria may be missing or patient reported. Any vitals not documented were not able to be obtained and vitals that have been documented are patient reported.  VideoDeclined- This patient declined Librarian, academic. Therefore the visit was completed with audio only.  Persons Participating in Visit: Patient.  AWV Questionnaire: Yes: Patient Medicare AWV questionnaire was completed by the patient on 03/23/2024; I have confirmed that all information answered by patient is correct and no changes since this date.  NO CHANGES IN PHQ9 from 03/18/2024-PER PT  Cardiac Risk Factors include: advanced age (>39men, >33 women);dyslipidemia;male gender;diabetes mellitus;hypertension;obesity (BMI >30kg/m2)     Objective:    Today's Vitals   03/23/24 0926  Weight: 244 lb (110.7 kg)  Height: 5' 7 (1.702 m)   Body mass index is 38.22 kg/m.     03/23/2024    9:31 AM 01/08/2024    8:50 AM 01/07/2024    9:19 AM 01/08/2023   11:19 AM 01/02/2022   10:21 AM 11/19/2020    8:05 AM 04/30/2020    9:07 AM  Advanced Directives  Does Patient Have a Medical Advance Directive? No No No No Yes No No   Type of Agricultural Consultant;Living will    Does patient want to make changes to medical advance directive?       No - Patient declined  Copy of Healthcare Power of Attorney in Chart?     No - copy requested    Would patient like information on creating a medical advance directive?  No - Patient declined  No - Patient declined  No - Patient declined     Current Medications (verified) Outpatient Encounter Medications as of 03/23/2024  Medication Sig   albuterol  (VENTOLIN  HFA) 108 (90 Base) MCG/ACT inhaler INHALE 2 PUFFS BY MOUTH EVERY 6 HOURS AS NEEDED FOR WHEEZING FOR SHORTNESS OF BREATH   ALPRAZolam  (XANAX ) 0.5 MG tablet Take 1 tablet (0.5 mg total) by mouth 2 (two) times daily as needed for anxiety.   aspirin 81 MG tablet Take 81 mg by mouth daily.   azelastine  (ASTELIN ) 0.1 % nasal spray Place 2 sprays into both nostrils 2 (two) times daily. Use in each nostril as directed   cetirizine  (ZYRTEC ) 10 MG tablet Take 1 tablet (10 mg total) by mouth daily.   ciprofloxacin  (CIPRO ) 250 MG tablet Take 250 mg by mouth 2 (two) times daily.   citalopram  (CELEXA ) 40 MG tablet Take 1 tablet by mouth once daily   esomeprazole  (NEXIUM ) 40 MG capsule Take 1 capsule (40 mg total) by mouth every morning.   fluticasone -salmeterol (ADVAIR ) 250-50 MCG/ACT AEPB Inhale 1 puff into the lungs 2 (two) times daily.   HYDROcodone -acetaminophen  (NORCO/VICODIN) 5-325 MG tablet  Take 1 tablet by mouth every 6 (six) hours as needed for moderate pain (pain score 4-6).   meclizine  (ANTIVERT ) 25 MG tablet TAKE 1 TABLET BY MOUTH THREE TIMES DAILY AS NEEDED FOR DIZZINESS   simvastatin  (ZOCOR ) 10 MG tablet Take 1 tablet (10 mg total) by mouth daily.   tamsulosin  (FLOMAX ) 0.4 MG CAPS capsule Take 1 capsule by mouth once daily   telmisartan  (MICARDIS ) 80 MG tablet Take 1 tablet (80 mg total) by mouth every morning.   traZODone  (DESYREL ) 50 MG tablet TAKE 1/2 TO 1 TABLET BY MOUTH AT bedtime    trimethoprim  (TRIMPEX ) 100 MG tablet Take 100 mg by mouth daily.   No facility-administered encounter medications on file as of 03/23/2024.    Allergies (verified) Patient has no known allergies.   History: Past Medical History:  Diagnosis Date   Allergy    Arthritis    Diabetes mellitus without complication (HCC)    GERD (gastroesophageal reflux disease)    History of kidney stones    Hyperlipemia    Hypertension    Sleep apnea 2005   moderate-has cpap-not used in 3 yr   Past Surgical History:  Procedure Laterality Date   COLONOSCOPY     CYSTOSCOPY/URETEROSCOPY/HOLMIUM LASER/STENT PLACEMENT Left 01/08/2024   Procedure: CYSTOSCOPY/URETEROSCOPY/HOLMIUM LASER/STENT PLACEMENT;  Surgeon: [name], MD;  Location: [facility];  Service: Urology;  Laterality: Left;   INSERTION OF MESH  05/05/2012   Procedure: INSERTION OF MESH;  Surgeon: Donnice Bury, MD;  Location: [facility];  Service: General;  Laterality: N/A;   LITHOTRIPSY     x2   SHOULDER ARTHROSCOPY  07/2017   trigger finger surgery  2006   UMBILICAL HERNIA REPAIR  05/05/2012   Procedure: HERNIA REPAIR UMBILICAL ADULT;  Surgeon: Donnice Bury, MD;  Location: [facility];  Service: General;  Laterality: N/A;   Family History  Problem Relation Age of Onset   Diabetes Mother    Pancreatitis Mother    Heart disease Mother 38       MI, died with MI   Stroke Father    Hypertension Father    Peripheral vascular disease Father        Carotid surgery   Heart disease Sister        Arrhythmia   Diabetes Sister 25       DM   Kidney cancer Sister    Diabetes Sister    Cancer Sister        colon   Heart disease Sister    Diabetes Sister    Heart disease Sister    Stroke Brother    Colon cancer Sister    Pancreatic cancer Other    Social History   Socioeconomic History   Marital status: Widowed    Spouse name: Not on file   Number of children: 6   Years of education: Not on file    Highest education level: Bachelor's degree (e.g., BA, AB, BS)  Occupational History   Occupation: retired  Tobacco Use   Smoking status: Never   Smokeless tobacco: Never  Vaping Use   Vaping status: Never Used  Substance and Sexual Activity   Alcohol use: No   Drug use: No   Sexual activity: Yes    Partners: Female  Other Topics Concern   Not on file  Social History Narrative   Exercise--walks a mile 5 days a week.  One deceased child age 74.  One stepchild, 5 adopted.  Lives with 3 of his children/2025 and has 1 dog   Social Drivers of Corporate Investment Banker Strain: Low Risk  (03/17/2024)   Overall Financial Resource Strain (CARDIA)    Difficulty of Paying Living Expenses: Not hard at all  Food Insecurity: No Food Insecurity (03/17/2024)   Hunger Vital Sign    Worried About Running Out of Food in the Last Year: Never true    Ran Out of Food in the Last Year: Never true  Transportation Needs: No Transportation Needs (03/17/2024)   PRAPARE - Administrator, Civil Service (Medical): No    Lack of Transportation (Non-Medical): No  Physical Activity: Insufficiently Active (03/17/2024)   Exercise Vital Sign    Days of Exercise per Week: 2 days    Minutes of Exercise per Session: 30 min  Stress: No Stress Concern Present (03/17/2024)   [employer] of Occupational Health - Occupational Stress Questionnaire    Feeling of Stress: Only a little  Social Connections: Moderately Integrated (03/17/2024)   Social Connection and Isolation Panel    Frequency of Communication with Friends and Family: More than three times a week    Frequency of Social Gatherings with Friends and Family: Once a week    Attends Religious Services: More than 4 times per year    Active Member of [employer] or Organizations: Yes    Attends Banker Meetings: More than 4 times per year    Marital Status: Widowed    Tobacco Counseling Counseling given: Not  Answered    Clinical Intake:  Pre-visit preparation completed: Yes  Pain : No/denies pain     BMI - recorded: 38.22 Nutritional Status: BMI > 30  Obese Nutritional Risks: None Diabetes: Yes CBG done?: No Did pt. bring in CBG monitor from home?: No  Lab Results  Component Value Date   HGBA1C 6.7 (H) 03/18/2024   HGBA1C 6.5 (H) 01/07/2024   HGBA1C 6.7 (H) 09/17/2023     How often do you need to have someone help you when you read instructions, pamphlets, or other written materials from your doctor or pharmacy?: 1 - Never  Interpreter Needed?: No  Information entered by :: Luwanda Starr, RMA   Activities of Daily Living     03/23/2024    9:28 AM 01/07/2024    9:21 AM  In your present state of health, do you have any difficulty performing the following activities:  Hearing? 0   Vision? 0   Difficulty concentrating or making decisions? 0   Walking or climbing stairs? 0   Dressing or bathing? 0   Doing errands, shopping? 0 0  Preparing Food and eating ? N   Using the Toilet? N   In the past six months, have you accidently leaked urine? Y   Do you have problems with loss of bowel control? N   Managing your Medications? N   Managing your Finances? N   Housekeeping or managing your Housekeeping? N     Patient Care Team: Mahlon Comer BRAVO, MD as PCP - General (Family Medicine) Legrand, Victory LITTIE MOULD, MD as Consulting Physician (Gastroenterology) Vernetta Lonni GRADE, MD as Consulting Physician (Orthopedic Surgery) Pandora Cadet, [facility] as Pharmacist (Pharmacist) [facility], [place] [facility]  I have updated your Care Teams any recent Medical Services you may have received from other providers in the past year.     Assessment:   This is a routine wellness examination for Taylor Harvey.  Hearing/Vision screen  Hearing Screening - Comments:: Denies hearing difficulties   Vision Screening - Comments:: Wears eyeglasses/[facility]/per pt- up to date    Goals Addressed   None    Depression Screen NO CHANGES_PER PT    03/18/2024    8:37 AM 01/08/2023   11:17 AM 12/08/2022    1:49 PM 11/03/2022   10:44 AM 09/16/2022    8:31 AM 07/29/2022   11:09 AM 03/14/2022    8:14 AM  PHQ 2/9 Scores  PHQ - 2 Score 0 0 2 0 0 0 0  PHQ- 9 Score 0 0 3 0 0 0 2    Fall Risk     03/23/2024    9:31 AM 03/18/2024    8:37 AM 12/28/2023   11:21 AM 09/03/2023    1:56 PM 08/21/2023    1:38 PM  Fall Risk   Falls in the past year? 0 0 0 0 0  Number falls in past yr:  0 0 0 0  Injury with Fall?  0 0 0 0  Risk for fall due to :  No Fall Risks  No Fall Risks No Fall Risks  Follow up Falls evaluation completed;Falls prevention discussed Falls evaluation completed  Falls evaluation completed     MEDICARE RISK AT HOME:  Medicare Risk at Home Any stairs in or around the home?: No If so, are there any without handrails?: No Home free of loose throw rugs in walkways, pet beds, electrical cords, etc?: Yes Adequate lighting in your home to reduce risk of falls?: Yes Life alert?: No Use of a cane, walker or w/c?: No Grab bars in the bathroom?: No Shower chair or bench in shower?: No Elevated toilet seat or a handicapped toilet?: No  TIMED UP AND GO:  Was the test performed?  No  Cognitive Function: 6CIT completed    07/23/2017    8:27 AM  MMSE - Mini Mental State Exam  Orientation to time 5  Orientation to Place 5  Registration 3  Attention/ Calculation 5  Recall 3  Language- name 2 objects 2  Language- repeat 1  Language- follow 3 step command 3  Language- read & follow direction 1  Write a sentence 1  Copy design 1  Total score 30        03/23/2024    9:32 AM 01/08/2023   11:18 AM 01/02/2022   10:23 AM  6CIT Screen  What Year? 0 points 0 points 0 points  What month? 0 points 0 points 0 points  What time? 0 points 0 points 0 points  Count back from 20 0 points 0 points 0 points  Months in reverse 0 points 0 points 0 points  Repeat phrase 0  points 0 points 0 points  Total Score 0 points 0 points 0 points    Immunizations Immunization History  Administered Date(s) Administered   INFLUENZA, HIGH DOSE SEASONAL PF 02/19/2019   Influenza Split 03/29/2012   Influenza Whole 03/30/2009, 02/24/2011   Influenza,inj,Quad PF,6+ Mos 03/21/2013, 03/17/2014, 03/20/2016, 02/16/2017, 01/14/2018   Pneumococcal Conjugate-13 07/19/2014   Pneumococcal Polysaccharide-23 01/07/2016   Td 03/04/2007, 09/22/2018   Zoster, Live 03/29/2012    Screening Tests Health Maintenance  Topic Date Due   COVID-19 Vaccine (1) 04/03/2024 (Originally 06/05/1955)   Influenza Vaccine  08/23/2024 (Originally 12/25/2023)   OPHTHALMOLOGY EXAM  08/09/2024   Diabetic kidney evaluation - Urine ACR  09/16/2024   FOOT EXAM  09/16/2024   HEMOGLOBIN A1C  09/16/2024   Diabetic kidney evaluation -  eGFR measurement  03/18/2025   Medicare Annual Wellness (AWV)  03/23/2025   Colonoscopy  11/14/2026   DTaP/Tdap/Td (3 - Tdap) 09/21/2028   Pneumococcal Vaccine: 50+ Years  Completed   Hepatitis C Screening  Completed   Meningococcal B Vaccine  Aged Out   Zoster Vaccines- Shingrix  Discontinued    Health Maintenance Items Addressed: See Nurse Notes at the end of this note  Additional Screening:  Vision Screening: Recommended annual ophthalmology exams for early detection of glaucoma and other disorders of the eye. Is the patient up to date with their annual eye exam?  Yes  Who is the provider or what is the name of the office in which the patient attends annual eye exams? [facility]-Patient up to date.  Dental Screening: Recommended annual dental exams for proper oral hygiene  Community Resource Referral / Chronic Care Management: CRR required this visit?  No   CCM required this visit?  No   Plan:    I have personally reviewed and noted the following in the patient's chart:   Medical and social history Use of alcohol, tobacco or illicit drugs  Current  medications and supplements including opioid prescriptions. Patient is currently taking opioid prescriptions. Information provided to patient regarding non-opioid alternatives. Patient advised to discuss non-opioid treatment plan with their provider. Functional ability and status Nutritional status Physical activity Advanced directives List of other physicians Hospitalizations, surgeries, and ER visits in previous 12 months Vitals Screenings to include cognitive, depression, and falls Referrals and appointments  In addition, I have reviewed and discussed with patient certain preventive protocols, quality metrics, and best practice recommendations. A written personalized care plan for preventive services as well as general preventive health recommendations were provided to patient.   Chioke Noxon L Deneice Wack, CMA   03/23/2024   After Visit Summary: (MyChart) Due to this being a telephonic visit, the after visit summary with patients personalized plan was offered to patient via MyChart   Notes: Patient declines Flu vaccine.  Patient is up to date on all other health maintenance with no concerns to address today.

## 2024-03-23 NOTE — Patient Instructions (Signed)
 Mr. Taylor Harvey,  Thank you for taking the time for your Medicare Wellness Visit. I appreciate your continued commitment to your health goals. Please review the care plan we discussed, and feel free to reach out if I can assist you further.  Medicare recommends these wellness visits once per year to help you and your care team stay ahead of potential health issues. These visits are designed to focus on prevention, allowing your provider to concentrate on managing your acute and chronic conditions during your regular appointments.  Please note that Annual Wellness Visits do not include a physical exam. Some assessments may be limited, especially if the visit was conducted virtually. If needed, we may recommend a separate in-person follow-up with your provider.  Ongoing Care Seeing your primary care provider every 3 to 6 months helps us  monitor your health and provide consistent, personalized care. Last office visit on 03/18/2024.  Keep up the good work.  Referrals If a referral was made during today's visit and you haven't received any updates within two weeks, please contact the referred provider directly to check on the status.  Recommended Screenings:  Health Maintenance  Topic Date Due   COVID-19 Vaccine (1) 04/03/2024*   Flu Shot  08/23/2024*   Eye exam for diabetics  08/09/2024   Yearly kidney health urinalysis for diabetes  09/16/2024   Complete foot exam   09/16/2024   Hemoglobin A1C  09/16/2024   Yearly kidney function blood test for diabetes  03/18/2025   Medicare Annual Wellness Visit  03/23/2025   Colon Cancer Screening  11/14/2026   DTaP/Tdap/Td vaccine (3 - Tdap) 09/21/2028   Pneumococcal Vaccine for age over 9  Completed   Hepatitis C Screening  Completed   Meningitis B Vaccine  Aged Out   Zoster (Shingles) Vaccine  Discontinued  *Topic was postponed. The date shown is not the original due date.       03/23/2024    9:31 AM  Advanced Directives  Does Patient Have a  Medical Advance Directive? No   Advance Care Planning is important because it: Ensures you receive medical care that aligns with your values, goals, and preferences. Provides guidance to your family and loved ones, reducing the emotional burden of decision-making during critical moments.  Vision: Annual vision screenings are recommended for early detection of glaucoma, cataracts, and diabetic retinopathy. These exams can also reveal signs of chronic conditions such as diabetes and high blood pressure.  Dental: Annual dental screenings help detect early signs of oral cancer, gum disease, and other conditions linked to overall health, including heart disease and diabetes.  Please see the attached documents for additional preventive care recommendations.   Managing Pain Without Opioids Opioids are strong medicines used to treat moderate to severe pain. For some people, especially those who have long-term (chronic) pain, opioids may not be the best choice for pain management due to: Side effects like nausea, constipation, and sleepiness. The risk of addiction (opioid use disorder). The longer you take opioids, the greater your risk of addiction. Pain that lasts for more than 3 months is called chronic pain. Managing chronic pain usually requires more than one approach and is often provided by a team of health care providers working together (multidisciplinary approach). Pain management may be done at a pain management center or pain clinic. How to manage pain without the use of opioids Use non-opioid medicines Non-opioid medicines for pain may include: Over-the-counter or prescription non-steroidal anti-inflammatory drugs (NSAIDs). These may be the first medicines used  for pain. They work well for muscle and bone pain, and they reduce swelling. Acetaminophen . This over-the-counter medicine may work well for milder pain but not swelling. Antidepressants. These may be used to treat chronic pain. A  certain type of antidepressant (tricyclics) is often used. These medicines are given in lower doses for pain than when used for depression. Anticonvulsants. These are usually used to treat seizures but may also reduce nerve (neuropathic) pain. Muscle relaxants. These relieve pain caused by sudden muscle tightening (spasms). You may also use a pain medicine that is applied to the skin as a patch, cream, or gel (topical analgesic), such as a numbing medicine. These may cause fewer side effects than medicines taken by mouth. Do certain therapies as directed Some therapies can help with pain management. They include: Physical therapy. You will do exercises to gain strength and flexibility. A physical therapist may teach you exercises to move and stretch parts of your body that are weak, stiff, or painful. You can learn these exercises at physical therapy visits and practice them at home. Physical therapy may also involve: Massage. Heat wraps or applying heat or cold to affected areas. Electrical signals that interrupt pain signals (transcutaneous electrical nerve stimulation, TENS). Weak lasers that reduce pain and swelling (low-level laser therapy). Signals from your body that help you learn to regulate pain (biofeedback). Occupational therapy. This helps you to learn ways to function at home and work with less pain. Recreational therapy. This involves trying new activities or hobbies, such as a physical activity or drawing. Mental health therapy, including: Cognitive behavioral therapy (CBT). This helps you learn coping skills for dealing with pain. Acceptance and commitment therapy (ACT) to change the way you think and react to pain. Relaxation therapies, including muscle relaxation exercises and mindfulness-based stress reduction. Pain management counseling. This may be individual, family, or group counseling.  Receive medical treatments Medical treatments for pain management include: Nerve  block injections. These may include a pain blocker and anti-inflammatory medicines. You may have injections: Near the spine to relieve chronic back or neck pain. Into joints to relieve back or joint pain. Into nerve areas that supply a painful area to relieve body pain. Into muscles (trigger point injections) to relieve some painful muscle conditions. A medical device placed near your spine to help block pain signals and relieve nerve pain or chronic back pain (spinal cord stimulation device). Acupuncture. Follow these instructions at home Medicines Take over-the-counter and prescription medicines only as told by your health care provider. If you are taking pain medicine, ask your health care providers about possible side effects to watch out for. Do not drive or use heavy machinery while taking prescription opioid pain medicine. Lifestyle  Do not use drugs or alcohol to reduce pain. If you drink alcohol, limit how much you have to: 0-1 drink a day for women who are not pregnant. 0-2 drinks a day for men. Know how much alcohol is in a drink. In the [place] one drink equals one 12 oz bottle of beer (355 mL), one 5 oz glass of wine (148 mL), or one 1 oz glass of hard liquor (44 mL). Do not use any products that contain nicotine or tobacco. These products include cigarettes, chewing tobacco, and vaping devices, such as e-cigarettes. If you need help quitting, ask your health care provider. Eat a healthy diet and maintain a healthy weight. Poor diet and excess weight may make pain worse. Eat foods that are high in fiber. These include fresh  fruits and vegetables, whole grains, and beans. Limit foods that are high in fat and processed sugars, such as fried and sweet foods. Exercise regularly. Exercise lowers stress and may help relieve pain. Ask your health care provider what activities and exercises are safe for you. If your health care provider approves, join an exercise class that combines  movement and stress reduction. Examples include yoga and tai chi. Get enough sleep. Lack of sleep may make pain worse. Lower stress as much as possible. Practice stress reduction techniques as told by your therapist. General instructions Work with all your pain management providers to find the treatments that work best for you. You are an important member of your pain management team. There are many things you can do to reduce pain on your own. Consider joining an online or in-person support group for people who have chronic pain. Keep all follow-up visits. This is important. Where to find more information You can find more information about managing pain without opioids from: American Academy of Pain Medicine: painmed.org Institute for Chronic Pain: instituteforchronicpain.org American Chronic Pain Association: theacpa.org Contact a health care provider if: You have side effects from pain medicine. Your pain gets worse or does not get better with treatments or home therapy. You are struggling with anxiety or depression. Summary Many types of pain can be managed without opioids. Chronic pain may respond better to pain management without opioids. Pain is best managed when you and a team of health care providers work together. Pain management without opioids may include non-opioid medicines, medical treatments, physical therapy, mental health therapy, and lifestyle changes. Tell your health care providers if your pain gets worse or is not being managed well enough. This information is not intended to replace advice given to you by your health care provider. Make sure you discuss any questions you have with your health care provider. Document Revised: 08/22/2020 Document Reviewed: 08/22/2020 Elsevier Patient Education  2024 [employer].

## 2024-03-28 ENCOUNTER — Encounter: Payer: Self-pay | Admitting: Radiology

## 2024-04-06 ENCOUNTER — Other Ambulatory Visit: Payer: Self-pay | Admitting: Family Medicine

## 2024-04-06 NOTE — Telephone Encounter (Signed)
 Requested Prescriptions   Pending Prescriptions Disp Refills   traZODone  (DESYREL ) 50 MG tablet [Pharmacy Med Name: traZODone  HCl 50 MG Oral Tablet] 90 tablet 0    Sig: TAKE 1/2 TO 1 (ONE-HALF TO ONE) TABLET BY MOUTH AT BEDTIME     Date of patient request: 04/06/2024 Last office visit: 03/18/2024 Upcoming visit: 09/19/2024 Date of last refill: 04/29/2024 Last refill amount: 90

## 2024-04-10 ENCOUNTER — Other Ambulatory Visit: Payer: Self-pay | Admitting: Family Medicine

## 2024-04-10 DIAGNOSIS — R399 Unspecified symptoms and signs involving the genitourinary system: Secondary | ICD-10-CM

## 2024-04-14 DIAGNOSIS — R350 Frequency of micturition: Secondary | ICD-10-CM | POA: Diagnosis not present

## 2024-04-14 DIAGNOSIS — N200 Calculus of kidney: Secondary | ICD-10-CM | POA: Diagnosis not present

## 2024-04-14 DIAGNOSIS — N401 Enlarged prostate with lower urinary tract symptoms: Secondary | ICD-10-CM | POA: Diagnosis not present

## 2024-05-03 ENCOUNTER — Other Ambulatory Visit: Payer: Self-pay | Admitting: Family Medicine

## 2024-05-10 ENCOUNTER — Other Ambulatory Visit: Payer: Self-pay | Admitting: Family Medicine

## 2024-05-10 DIAGNOSIS — E785 Hyperlipidemia, unspecified: Secondary | ICD-10-CM

## 2024-05-10 DIAGNOSIS — K219 Gastro-esophageal reflux disease without esophagitis: Secondary | ICD-10-CM

## 2024-05-18 ENCOUNTER — Other Ambulatory Visit: Payer: Self-pay | Admitting: Family Medicine

## 2024-05-18 DIAGNOSIS — E785 Hyperlipidemia, unspecified: Secondary | ICD-10-CM

## 2024-05-27 ENCOUNTER — Other Ambulatory Visit: Payer: Self-pay | Admitting: Family Medicine

## 2024-05-27 DIAGNOSIS — E785 Hyperlipidemia, unspecified: Secondary | ICD-10-CM

## 2024-06-06 ENCOUNTER — Other Ambulatory Visit: Payer: Self-pay | Admitting: Family Medicine

## 2024-09-19 ENCOUNTER — Encounter: Admitting: Family Medicine

## 2025-03-30 ENCOUNTER — Ambulatory Visit
